# Patient Record
Sex: Female | Born: 1975 | Race: Black or African American | Hispanic: No | Smoking: Never smoker
Health system: Southern US, Community
[De-identification: ages and names within clinical notes are randomized; demographics above are authoritative.]

## PROBLEM LIST (undated history)

## (undated) ENCOUNTER — Inpatient Hospital Stay (HOSPITAL_COMMUNITY): Payer: Self-pay

## (undated) DIAGNOSIS — R87619 Unspecified abnormal cytological findings in specimens from cervix uteri: Secondary | ICD-10-CM

## (undated) DIAGNOSIS — I10 Essential (primary) hypertension: Secondary | ICD-10-CM

## (undated) DIAGNOSIS — I729 Aneurysm of unspecified site: Secondary | ICD-10-CM

## (undated) DIAGNOSIS — E611 Iron deficiency: Secondary | ICD-10-CM

## (undated) DIAGNOSIS — F319 Bipolar disorder, unspecified: Secondary | ICD-10-CM

## (undated) DIAGNOSIS — F4310 Post-traumatic stress disorder, unspecified: Secondary | ICD-10-CM

## (undated) DIAGNOSIS — IMO0002 Reserved for concepts with insufficient information to code with codable children: Secondary | ICD-10-CM

## (undated) DIAGNOSIS — J45909 Unspecified asthma, uncomplicated: Secondary | ICD-10-CM

## (undated) HISTORY — DX: Iron deficiency: E61.1

## (undated) HISTORY — DX: Post-traumatic stress disorder, unspecified: F43.10

## (undated) HISTORY — DX: Reserved for concepts with insufficient information to code with codable children: IMO0002

## (undated) HISTORY — DX: Unspecified abnormal cytological findings in specimens from cervix uteri: R87.619

## (undated) HISTORY — PX: NO PAST SURGERIES: SHX2092

## (undated) HISTORY — DX: Bipolar disorder, unspecified: F31.9

---

## 2001-03-17 ENCOUNTER — Encounter: Payer: Self-pay | Admitting: Emergency Medicine

## 2001-03-17 ENCOUNTER — Emergency Department (HOSPITAL_COMMUNITY): Admission: EM | Admit: 2001-03-17 | Discharge: 2001-03-17 | Payer: Self-pay | Admitting: Emergency Medicine

## 2001-08-17 ENCOUNTER — Other Ambulatory Visit: Admission: RE | Admit: 2001-08-17 | Discharge: 2001-08-17 | Payer: Self-pay | Admitting: Obstetrics and Gynecology

## 2002-07-14 ENCOUNTER — Emergency Department (HOSPITAL_COMMUNITY): Admission: EM | Admit: 2002-07-14 | Discharge: 2002-07-14 | Payer: Self-pay

## 2002-07-14 ENCOUNTER — Encounter: Payer: Self-pay | Admitting: *Deleted

## 2002-07-28 ENCOUNTER — Other Ambulatory Visit: Admission: RE | Admit: 2002-07-28 | Discharge: 2002-07-28 | Payer: Self-pay | Admitting: Obstetrics and Gynecology

## 2003-12-06 ENCOUNTER — Other Ambulatory Visit: Admission: RE | Admit: 2003-12-06 | Discharge: 2003-12-06 | Payer: Self-pay | Admitting: Obstetrics and Gynecology

## 2005-02-04 ENCOUNTER — Emergency Department (HOSPITAL_COMMUNITY): Admission: EM | Admit: 2005-02-04 | Discharge: 2005-02-04 | Payer: Self-pay | Admitting: Emergency Medicine

## 2005-04-13 ENCOUNTER — Other Ambulatory Visit: Admission: RE | Admit: 2005-04-13 | Discharge: 2005-04-13 | Payer: Self-pay | Admitting: Obstetrics and Gynecology

## 2005-08-22 ENCOUNTER — Inpatient Hospital Stay (HOSPITAL_COMMUNITY): Admission: AD | Admit: 2005-08-22 | Discharge: 2005-08-22 | Payer: Self-pay | Admitting: Obstetrics and Gynecology

## 2005-09-21 ENCOUNTER — Inpatient Hospital Stay (HOSPITAL_COMMUNITY): Admission: AD | Admit: 2005-09-21 | Discharge: 2005-09-21 | Payer: Self-pay | Admitting: Obstetrics and Gynecology

## 2005-10-06 ENCOUNTER — Inpatient Hospital Stay (HOSPITAL_COMMUNITY): Admission: AD | Admit: 2005-10-06 | Discharge: 2005-10-06 | Payer: Self-pay | Admitting: Obstetrics and Gynecology

## 2005-10-15 ENCOUNTER — Inpatient Hospital Stay (HOSPITAL_COMMUNITY): Admission: AD | Admit: 2005-10-15 | Discharge: 2005-10-17 | Payer: Self-pay | Admitting: Obstetrics and Gynecology

## 2006-05-12 ENCOUNTER — Emergency Department (HOSPITAL_COMMUNITY): Admission: EM | Admit: 2006-05-12 | Discharge: 2006-05-12 | Payer: Self-pay | Admitting: Emergency Medicine

## 2006-06-24 ENCOUNTER — Emergency Department (HOSPITAL_COMMUNITY): Admission: EM | Admit: 2006-06-24 | Discharge: 2006-06-24 | Payer: Self-pay | Admitting: Emergency Medicine

## 2007-08-09 ENCOUNTER — Inpatient Hospital Stay (HOSPITAL_COMMUNITY): Admission: AD | Admit: 2007-08-09 | Discharge: 2007-08-09 | Payer: Self-pay | Admitting: Obstetrics & Gynecology

## 2008-07-22 ENCOUNTER — Emergency Department (HOSPITAL_COMMUNITY): Admission: EM | Admit: 2008-07-22 | Discharge: 2008-07-22 | Payer: Self-pay | Admitting: Emergency Medicine

## 2008-07-28 ENCOUNTER — Emergency Department (HOSPITAL_COMMUNITY): Admission: EM | Admit: 2008-07-28 | Discharge: 2008-07-28 | Payer: Self-pay | Admitting: Family Medicine

## 2009-01-21 ENCOUNTER — Emergency Department (HOSPITAL_COMMUNITY): Admission: EM | Admit: 2009-01-21 | Discharge: 2009-01-21 | Payer: Self-pay | Admitting: Emergency Medicine

## 2009-04-10 ENCOUNTER — Emergency Department (HOSPITAL_COMMUNITY): Admission: EM | Admit: 2009-04-10 | Discharge: 2009-04-10 | Payer: Self-pay | Admitting: Emergency Medicine

## 2009-09-27 ENCOUNTER — Emergency Department (HOSPITAL_COMMUNITY): Admission: EM | Admit: 2009-09-27 | Discharge: 2009-09-27 | Payer: Self-pay | Admitting: Emergency Medicine

## 2010-05-02 LAB — POCT CARDIAC MARKERS
CKMB, poc: <1 ng/mL — ABNORMAL LOW (ref 1.0–8.0)
Myoglobin, poc: 34.9 ng/mL (ref 12–200)
Troponin i, poc: <0.05 ng/mL (ref 0.00–0.09)

## 2010-05-02 LAB — POCT I-STAT, CHEM 8
Calcium, Ion: 1.15 mmol/L (ref 1.12–1.32)
Creatinine, Ser: 0.9 mg/dL (ref 0.4–1.2)
Glucose, Bld: 83 mg/dL (ref 70–99)
Hemoglobin: 12.6 g/dL (ref 12.0–15.0)
Sodium: 140 mEq/L (ref 135–145)
TCO2: 27 mmol/L (ref 0–100)

## 2010-05-05 ENCOUNTER — Inpatient Hospital Stay (INDEPENDENT_AMBULATORY_CARE_PROVIDER_SITE_OTHER)
Admission: RE | Admit: 2010-05-05 | Discharge: 2010-05-05 | Disposition: A | Discharge status: Home / Self Care | Payer: Medicaid Other | Source: Ambulatory Visit | Attending: Family Medicine | Admitting: Family Medicine

## 2010-05-05 ENCOUNTER — Emergency Department (HOSPITAL_COMMUNITY): Payer: Medicaid Other

## 2010-05-05 ENCOUNTER — Emergency Department (HOSPITAL_COMMUNITY)
Admission: EM | Admit: 2010-05-05 | Discharge: 2010-05-05 | Disposition: A | Discharge status: Home / Self Care | Payer: Medicaid Other | Attending: Emergency Medicine | Admitting: Emergency Medicine

## 2010-05-05 DIAGNOSIS — H6690 Otitis media, unspecified, unspecified ear: Secondary | ICD-10-CM

## 2010-05-05 DIAGNOSIS — R51 Headache: Secondary | ICD-10-CM | POA: Insufficient documentation

## 2010-05-05 DIAGNOSIS — H539 Unspecified visual disturbance: Secondary | ICD-10-CM | POA: Insufficient documentation

## 2010-05-28 ENCOUNTER — Ambulatory Visit: Payer: Medicaid Other | Attending: Diagnostic Neuroimaging | Admitting: Physical Therapy

## 2010-05-28 DIAGNOSIS — IMO0001 Reserved for inherently not codable concepts without codable children: Secondary | ICD-10-CM | POA: Insufficient documentation

## 2010-05-28 DIAGNOSIS — R262 Difficulty in walking, not elsewhere classified: Secondary | ICD-10-CM | POA: Insufficient documentation

## 2010-06-10 ENCOUNTER — Ambulatory Visit: Payer: Medicaid Other | Admitting: Physical Therapy

## 2010-06-24 ENCOUNTER — Ambulatory Visit: Payer: Medicaid Other | Admitting: Physical Therapy

## 2010-07-08 ENCOUNTER — Ambulatory Visit: Payer: Medicaid Other | Admitting: Physical Therapy

## 2010-09-18 ENCOUNTER — Inpatient Hospital Stay (INDEPENDENT_AMBULATORY_CARE_PROVIDER_SITE_OTHER)
Admission: RE | Admit: 2010-09-18 | Discharge: 2010-09-18 | Disposition: A | Discharge status: Home / Self Care | Payer: Medicaid Other | Source: Ambulatory Visit | Attending: Family Medicine | Admitting: Family Medicine

## 2010-09-18 DIAGNOSIS — H6120 Impacted cerumen, unspecified ear: Secondary | ICD-10-CM

## 2010-10-28 ENCOUNTER — Inpatient Hospital Stay (INDEPENDENT_AMBULATORY_CARE_PROVIDER_SITE_OTHER)
Admission: RE | Admit: 2010-10-28 | Discharge: 2010-10-28 | Disposition: A | Discharge status: Home / Self Care | Payer: Medicaid Other | Source: Ambulatory Visit | Attending: Family Medicine | Admitting: Family Medicine

## 2010-10-28 DIAGNOSIS — J069 Acute upper respiratory infection, unspecified: Secondary | ICD-10-CM

## 2010-11-13 LAB — CBC
MCHC: 33.4
Platelets: 205
RDW: 14.7

## 2010-11-13 LAB — POCT PREGNANCY, URINE
Operator id: 22373
Preg Test, Ur: NEGATIVE

## 2010-11-13 LAB — HERPES SIMPLEX VIRUS CULTURE: Culture: NOT DETECTED

## 2010-11-13 LAB — GC/CHLAMYDIA PROBE AMP, GENITAL
Chlamydia, DNA Probe: NEGATIVE
GC Probe Amp, Genital: NEGATIVE

## 2011-01-02 ENCOUNTER — Emergency Department (HOSPITAL_COMMUNITY)
Admission: EM | Admit: 2011-01-02 | Discharge: 2011-01-02 | Disposition: A | Discharge status: Home / Self Care | Payer: Medicaid Other | Source: Home / Self Care | Attending: Family Medicine | Admitting: Family Medicine

## 2011-01-02 DIAGNOSIS — IMO0002 Reserved for concepts with insufficient information to code with codable children: Secondary | ICD-10-CM

## 2011-01-02 DIAGNOSIS — S43409A Unspecified sprain of unspecified shoulder joint, initial encounter: Secondary | ICD-10-CM

## 2011-01-02 MED ORDER — IBUPROFEN 800 MG PO TABS: 800.0000 mg | ORAL_TABLET | Freq: Three times a day (TID) | ORAL | Status: AC

## 2011-01-02 NOTE — ED Notes (Signed)
Pt states she slipped in her yard this am, fell backwards.  C/o pain in lt side of neck, lt shoulder and lt upper back.
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
## 2011-04-06 ENCOUNTER — Encounter (HOSPITAL_COMMUNITY): Payer: Self-pay

## 2011-04-06 ENCOUNTER — Emergency Department (INDEPENDENT_AMBULATORY_CARE_PROVIDER_SITE_OTHER)
Admission: EM | Admit: 2011-04-06 | Discharge: 2011-04-06 | Disposition: A | Discharge status: Home Health | Payer: Self-pay | Source: Home / Self Care | Attending: Family Medicine | Admitting: Family Medicine

## 2011-04-06 DIAGNOSIS — S161XXA Strain of muscle, fascia and tendon at neck level, initial encounter: Secondary | ICD-10-CM

## 2011-04-06 DIAGNOSIS — S139XXA Sprain of joints and ligaments of unspecified parts of neck, initial encounter: Secondary | ICD-10-CM

## 2011-04-06 MED ORDER — IBUPROFEN 600 MG PO TABS: 600.0000 mg | ORAL_TABLET | Freq: Three times a day (TID) | ORAL | Status: AC | PRN

## 2011-04-06 MED ORDER — CYCLOBENZAPRINE HCL 10 MG PO TABS: 10.0000 mg | ORAL_TABLET | Freq: Two times a day (BID) | ORAL | Status: AC | PRN
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
## 2011-04-06 NOTE — Discharge Instructions (Signed)
Take the prescribed medications as instructed. Be aware that Flexeril can make you drowsy and he should not drive after taking. Followup with the sports medicine or orthopedic if persistent pain after her 1 or 2 weeks or return earlier if worsening symptoms despite following treatment.  Cervical Strain and Sprain (Whiplash) with Rehab Cervical strain and sprains are injuries that commonly occur with "whiplash" injuries. Whiplash occurs when the neck is forcefully whipped backward or forward, such as during a motor vehicle accident. The muscles, ligaments, tendons, discs and nerves of the neck are susceptible to injury when this occurs. SYMPTOMS   Pain or stiffness in the front and/or back of neck   Symptoms may present immediately or up to 24 hours after injury.   Dizziness, headache, nausea and vomiting.   Muscle spasm with soreness and stiffness in the neck.   Tenderness and swelling at the injury site.  CAUSES  Whiplash injuries often occur during contact sports or motor vehicle accidents.  RISK INCREASES WITH:  Osteoarthritis of the spine.   Situations that make head or neck accidents or trauma more likely.   High-risk sports (football, rugby, wrestling, hockey, auto racing, gymnastics, diving, contact karate or boxing).   Poor strength and flexibility of the neck.   Previous neck injury.   Poor tackling technique.   Improperly fitted or padded equipment.  PREVENTION  Learn and use proper technique (avoid tackling with the head, spearing and head-butting; use proper falling techniques to avoid landing on the head).   Warm up and stretch properly before activity.   Maintain physical fitness:   Strength, flexibility and endurance.   Cardiovascular fitness.   Wear properly fitted and padded protective equipment, such as padded soft collars, for participation in contact sports.  PROGNOSIS  Recovery for cervical strain and sprain injuries is dependent on the extent  of the injury. These injuries are usually curable in 1 week to 3 months with appropriate treatment.  RELATED COMPLICATIONS   Temporary numbness and weakness may occur if the nerve roots are damaged, and this may persist until the nerve has completely healed.   Chronic pain due to frequent recurrence of symptoms.   Prolonged healing, especially if activity is resumed too soon (before complete recovery).  TREATMENT  Treatment initially involves the use of ice and medication to help reduce pain and inflammation. It is also important to perform strengthening and stretching exercises and modify activities that worsen symptoms so the injury does not get worse. These exercises may be performed at home or with a therapist. For patients who experience severe symptoms, a soft padded collar may be recommended to be worn around the neck.  Improving your posture may help reduce symptoms. Posture improvement includes pulling your chin and abdomen in while sitting or standing. If you are sitting, sit in a firm chair with your buttocks against the back of the chair. While sleeping, try replacing your pillow with a small towel rolled to 2 inches in diameter, or use a cervical pillow or soft cervical collar. Poor sleeping positions delay healing.  For patients with nerve root damage, which causes numbness or weakness, the use of a cervical traction apparatus may be recommended. Surgery is rarely necessary for these injuries. However, cervical strain and sprains that are present at birth (congenital) may require surgery. MEDICATION   If pain medication is necessary, nonsteroidal anti-inflammatory medications, such as aspirin and ibuprofen, or other minor pain relievers, such as acetaminophen, are often recommended.   Do not take  pain medication for 7 days before surgery.   Prescription pain relievers may be given if deemed necessary by your caregiver. Use only as directed and only as much as you need.  HEAT AND  COLD:   Cold treatment (icing) relieves pain and reduces inflammation. Cold treatment should be applied for 10 to 15 minutes every 2 to 3 hours for inflammation and pain and immediately after any activity that aggravates your symptoms. Use ice packs or an ice massage.   Heat treatment may be used prior to performing the stretching and strengthening activities prescribed by your caregiver, physical therapist, or athletic trainer. Use a heat pack or a warm soak.  SEEK MEDICAL CARE IF:   Symptoms get worse or do not improve in 2 weeks despite treatment.   New, unexplained symptoms develop (drugs used in treatment may produce side effects).  EXERCISES RANGE OF MOTION (ROM) AND STRETCHING EXERCISES - Cervical Strain and Sprain These exercises may help you when beginning to rehabilitate your injury. In order to successfully resolve your symptoms, you must improve your posture. These exercises are designed to help reduce the forward-head and rounded-shoulder posture which contributes to this condition. Your symptoms may resolve with or without further involvement from your physician, physical therapist or athletic trainer. While completing these exercises, remember:   Restoring tissue flexibility helps normal motion to return to the joints. This allows healthier, less painful movement and activity.   An effective stretch should be held for at least 20 seconds, although you may need to begin with shorter hold times for comfort.   A stretch should never be painful. You should only feel a gentle lengthening or release in the stretched tissue.  STRETCH- Axial Extensors  Lie on your back on the floor. You may bend your knees for comfort. Place a rolled up hand towel or dish towel, about 2 inches in diameter, under the part of your head that makes contact with the floor.   Gently tuck your chin, as if trying to make a "double chin," until you feel a gentle stretch at the base of your head.   Hold  __________ seconds.  Repeat __________ times. Complete this exercise __________ times per day.  STRETECH - Axial Extension   Stand or sit on a firm surface. Assume a good posture: chest up, shoulders drawn back, abdominal muscles slightly tense, knees unlocked (if standing) and feet hip width apart.   Slowly retract your chin so your head slides back and your chin slightly lowers.Continue to look straight ahead.   You should feel a gentle stretch in the back of your head. Be certain not to feel an aggressive stretch since this can cause headaches later.   Hold for __________ seconds.  Repeat __________ times. Complete this exercise __________ times per day. STRETCH - Cervical Side Bend   Stand or sit on a firm surface. Assume a good posture: chest up, shoulders drawn back, abdominal muscles slightly tense, knees unlocked (if standing) and feet hip width apart.   Without letting your nose or shoulders move, slowly tip your right / left ear to your shoulder until your feel a gentle stretch in the muscles on the opposite side of your neck.   Hold __________ seconds.  Repeat __________ times. Complete this exercise __________ times per day. STRETCH - Cervical Rotators   Stand or sit on a firm surface. Assume a good posture: chest up, shoulders drawn back, abdominal muscles slightly tense, knees unlocked (if standing) and feet hip width  apart.   Keeping your eyes level with the ground, slowly turn your head until you feel a gentle stretch along the back and opposite side of your neck.   Hold __________ seconds.  Repeat __________ times. Complete this exercise __________ times per day. RANGE OF MOTION - Neck Circles   Stand or sit on a firm surface. Assume a good posture: chest up, shoulders drawn back, abdominal muscles slightly tense, knees unlocked (if standing) and feet hip width apart.   Gently roll your head down and around from the back of one shoulder to the back of the other. The  motion should never be forced or painful.   Repeat the motion 10-20 times, or until you feel the neck muscles relax and loosen.  Repeat __________ times. Complete the exercise __________ times per day. STRENGTHENING EXERCISES - Cervical Strain and Sprain These exercises may help you when beginning to rehabilitate your injury. They may resolve your symptoms with or without further involvement from your physician, physical therapist or athletic trainer. While completing these exercises, remember:   Muscles can gain both the endurance and the strength needed for everyday activities through controlled exercises.   Complete these exercises as instructed by your physician, physical therapist or athletic trainer. Progress the resistance and repetitions only as guided.   You may experience muscle soreness or fatigue, but the pain or discomfort you are trying to eliminate should never worsen during these exercises. If this pain does worsen, stop and make certain you are following the directions exactly. If the pain is still present after adjustments, discontinue the exercise until you can discuss the trouble with your clinician.  STRENGTH - Cervical Flexors, Isometric  Face a wall, standing about 6 inches away. Place a small pillow, a ball about 6-8 inches in diameter, or a folded towel between your forehead and the wall.   Slightly tuck your chin and gently push your forehead into the soft object. Push only with mild to moderate intensity, building up tension gradually. Keep your jaw and forehead relaxed.   Hold 10 to 20 seconds. Keep your breathing relaxed.   Release the tension slowly. Relax your neck muscles completely before you start the next repetition.  Repeat __________ times. Complete this exercise __________ times per day. STRENGTH- Cervical Lateral Flexors, Isometric   Stand about 6 inches away from a wall. Place a small pillow, a ball about 6-8 inches in diameter, or a folded towel  between the side of your head and the wall.   Slightly tuck your chin and gently tilt your head into the soft object. Push only with mild to moderate intensity, building up tension gradually. Keep your jaw and forehead relaxed.   Hold 10 to 20 seconds. Keep your breathing relaxed.   Release the tension slowly. Relax your neck muscles completely before you start the next repetition.  Repeat __________ times. Complete this exercise __________ times per day. STRENGTH - Cervical Extensors, Isometric   Stand about 6 inches away from a wall. Place a small pillow, a ball about 6-8 inches in diameter, or a folded towel between the back of your head and the wall.   Slightly tuck your chin and gently tilt your head back into the soft object. Push only with mild to moderate intensity, building up tension gradually. Keep your jaw and forehead relaxed.   Hold 10 to 20 seconds. Keep your breathing relaxed.   Release the tension slowly. Relax your neck muscles completely before you start the next repetition.  Repeat __________ times. Complete this exercise __________ times per day. POSTURE AND BODY MECHANICS CONSIDERATIONS - Cervical Strain and Sprain Keeping correct posture when sitting, standing or completing your activities will reduce the stress put on different body tissues, allowing injured tissues a chance to heal and limiting painful experiences. The following are general guidelines for improved posture. Your physician or physical therapist will provide you with any instructions specific to your needs. While reading these guidelines, remember:  The exercises prescribed by your provider will help you have the flexibility and strength to maintain correct postures.   The correct posture provides the optimal environment for your joints to work. All of your joints have less wear and tear when properly supported by a spine with good posture. This means you will experience a healthier, less painful body.     Correct posture must be practiced with all of your activities, especially prolonged sitting and standing. Correct posture is as important when doing repetitive low-stress activities (typing) as it is when doing a single heavy-load activity (lifting).  PROLONGED STANDING WHILE SLIGHTLY LEANING FORWARD When completing a task that requires you to lean forward while standing in one place for a long time, place either foot up on a stationary 2-4 inch high object to help maintain the best posture. When both feet are on the ground, the low back tends to lose its slight inward curve. If this curve flattens (or becomes too large), then the back and your other joints will experience too much stress, fatigue more quickly and can cause pain.  RESTING POSITIONS Consider which positions are most painful for you when choosing a resting position. If you have pain with flexion-based activities (sitting, bending, stooping, squatting), choose a position that allows you to rest in a less flexed posture. You would want to avoid curling into a fetal position on your side. If your pain worsens with extension-based activities (prolonged standing, working overhead), avoid resting in an extended position such as sleeping on your stomach. Most people will find more comfort when they rest with their spine in a more neutral position, neither too rounded nor too arched. Lying on a non-sagging bed on your side with a pillow between your knees, or on your back with a pillow under your knees will often provide some relief. Keep in mind, being in any one position for a prolonged period of time, no matter how correct your posture, can still lead to stiffness. WALKING Walk with an upright posture. Your ears, shoulders and hips should all line-up. OFFICE WORK When working at a desk, create an environment that supports good, upright posture. Without extra support, muscles fatigue and lead to excessive strain on joints and other  tissues. CHAIR:  A chair should be able to slide under your desk when your back makes contact with the back of the chair. This allows you to work closely.   The chair's height should allow your eyes to be level with the upper part of your monitor and your hands to be slightly lower than your elbows.   Body position:   Your feet should make contact with the floor. If this is not possible, use a foot rest.   Keep your ears over your shoulders. This will reduce stress on your neck and low back.  Document Released: 02/02/2005 Document Revised: 10/15/2010 Document Reviewed: 05/17/2008 [facility] Patient Information 2012 [place], [place].
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
## 2011-06-09 ENCOUNTER — Emergency Department (HOSPITAL_COMMUNITY)
Admission: EM | Admit: 2011-06-09 | Discharge: 2011-06-09 | Disposition: A | Discharge status: Home / Self Care | Payer: Medicaid Other | Source: Home / Self Care | Attending: Emergency Medicine | Admitting: Emergency Medicine

## 2011-06-09 ENCOUNTER — Encounter (HOSPITAL_COMMUNITY): Payer: Self-pay | Admitting: Emergency Medicine

## 2011-06-09 DIAGNOSIS — J209 Acute bronchitis, unspecified: Secondary | ICD-10-CM

## 2011-06-09 MED ORDER — ALBUTEROL SULFATE HFA 108 (90 BASE) MCG/ACT IN AERS: 1.0000 | INHALATION_SPRAY | Freq: Four times a day (QID) | RESPIRATORY_TRACT | Status: DC | PRN

## 2011-06-09 MED ORDER — BENZONATATE 200 MG PO CAPS: 200.0000 mg | ORAL_CAPSULE | Freq: Three times a day (TID) | ORAL | Status: AC | PRN

## 2011-06-09 MED ORDER — PREDNISONE 5 MG PO KIT: 1.0000 | PACK | Freq: Every day | ORAL | Status: DC

## 2011-06-09 MED ORDER — AZITHROMYCIN 250 MG PO TABS: ORAL_TABLET | ORAL | Status: AC

## 2011-06-09 NOTE — ED Notes (Signed)
Patient reports spotting earlier this month, but no period

## 2011-06-09 NOTE — Discharge Instructions (Signed)
Most upper respiratory infections are caused by viruses and do not require antibiotics.  We try to save the antibiotics for when we really need them to avoid resistance.  This does not mean that there is nothing that can be done.  Here are a few hints about things that can be done at home to get over an upper respiratory infection quicker:  Get extra sleep and extra fluids.  Get 7 to 9 hours of sleep per night and 6 to 8 glasses of water a day.  Getting extra sleep keeps the immune system from getting run down.  Most people with an upper respiratory infection are a little dehydrated.  The extra fluids also keep the secretions liquified and easier to deal with.  Also, get extra vitamin C.  4000 mg per day is the recommended dose. For the aches, headache, and fever, acetaminophen or ibuprofen are helpful.  These can be alternated every 4 hours.  People with liver disease should avoid large amounts of acetaminophen, and people with ulcer disease, gastroesophageal reflux, gastritis, congestive heart failure, chronic kidney disease, coronary artery disease and the elderly should avoid ibuprofen. For nasal congestion try Mucinex-D, or if you're having lots of sneezing or copious clear nasal drainage Allegra-D-24 hour.  A Saline nasal spray such as Ocean Spray can also help as can decongestant sprays such as Afrin, but you should not use the decongestant sprays for more than 3 or 4 days since they can be habituating.  If nasal dryness is a problem, Ayr Nasal Gel can help moisturize your nasal passages.  Breath Rite nasal strips can also offer a non-drug alternative treatment to nasal congestion, especially at night. For people with symptoms of sinusitis, sleeping with your head elevated can be helpful.  For sinus pain, moist, hot compresses to the face may provide some relief.  Many people find that inhaling steam as in a shower or from a pot of steaming water can help. For sore throat, zinc containing lozenges such  as Cold-Eze or Zicam are helpful.  Zinc helps to fight infection and has a mild astringent effect that relieves the sore, achey throat.  Hot salt water gargles (8 oz of hot water, 1/2 tsp of table salt, and a pinch of baking soda) can give relief as well as hot beverages such as hot tea. For the cough, old time remedies such as honey or honey and lemon are tried and true.  Over the counter cough syrups such as Delsym 2 tsp every 12 hours can help as well. It has also been found that Aleve is helpful in treating cough.  You can take 1 to 2 tablets twice daily with food.  This can be taken along with Delsym.  It's important when you have an upper respiratory infection not to pass the infection to others.  This involves being very careful about the following:  Frequent hand washing or use of hand sanitizer, especially after coughing, sneezing, blowing your nose or touching your face, nose or eyes. Do not shake hands or touch anyone and try to avoid touching surfaces that other people use such as doorknobs, shopping carts, telephones and computer keyboards. Use tissues and dispose of them properly in a garbage can or ziplock bag. Cough into your sleeve. Do not let others eat or drink after you.  It's also important to recognize the signs of serious illness and get evaluated if they occur: Any respiratory infection that lasts more than 7 to 10 days.  Yellow  nasal drainage and sputum are not reliable indicators of a bacterial infection, but if they last for more than 1 week, see your doctor. Fever and sore throat can indicate strep. Fever and cough can indicate influenza or pneumonia. Any kind of severe symptom such as difficulty breathing, intractable vomiting, or severe pain should prompt you to see a doctor as soon as possible.   Your body's immune system is really the thing that will get rid of this infection.  Your immune system is comprised of 2 types of specialized cells called T cells and B  cells.  T cells coordinate the array of cells in your body that engulf invading bacteria or viruses while B cells orchestrate the production of antibodies that neutralize infection.  Anything we do or any medications we give you, will just strengthen your immune system or help it clear up the infection quicker.  Here are a few helpful hints to improve your immune system to help overcome this illness or to prevent future infections:  A few vitamins can improve the health of your immune system.  That's why your diet should include plenty of fruits, vegetables, fish, nuts, and whole grains.  Vitamin A and bet-carotene can increase the cells that fight infections (T cells and B cells).  Vitamin A is abundant in dark greens and orange vegetables such as spinach, greens, sweet potatoes, and carrots.  Vitamin B6 contributes to the maturation of white blood cells, the cells that fight disease.  Foods with vitamin B6 include cold cereal and bananas.  Vitamin C is credited with preventing colds because it increases white blood cells and also prevents cellular damage.  Citrus fruits, peaches and green and red bell peppers are all hight in vitamin C.  Vitamin E is an anti-oxidant that encourages the production of natural killer cells which reject foreign invaders and B cells that produce antibodies.  Foods high in vitamin E include wheat germ, nuts and seeds.  Foods high in omega-3 fatty acids found in foods like salmon, tuna and mackerel boost your immune system and help cells to engulf and absorb germs.  Probiotics are good bacteria that increase your T cells.  These can be found in yogurt and are available in supplements such as Culturelle or Align.  Moderate exercise increases the strength of your immune system and your ability to recover from illness.  I suggest 3 to 5 moderate intensity 30 minute workouts per week.    Sleep is another component of maintaining a strong immune system.  It enables your body  to recuperate from the day's activities, stress and work.  My recommendation is to get between 7 and 9 hours of sleep per night.  If you smoke, try to quit completely or at least cut down.  Drink alcohol only in moderation if at all.  No more than 2 drinks daily for men or 1 for women.  Get a flu vaccine early in the fall or if you have not gotten one yet, once this illness has run its course.  If you are over 65, a smoker, or an asthmatic, get a pneumococcal vaccine.  My final recommendation is to maintain a healthy weight.  Excess weight can impair the immune system by interfering with the way the immune system deals with invading viruses or bacteria.

## 2011-06-09 NOTE — ED Notes (Signed)
Sent patient to obtain specimen

## 2011-06-09 NOTE — ED Notes (Signed)
Patient has a cough, chills, sore throat, hot/cold episodes.  Intermittent runny nose.  Denies fever.  Reports one episode of vomiting, on Sunday.  Denies diarrhea

## 2011-06-09 NOTE — ED Provider Notes (Signed)
Chief Complaint  Patient presents with  . URI    History of Present Illness:   The patient is a 36 year old female who's had a one-week history of nonproductive cough, chest tightness, sore throat, has felt hot and cold and chilled, had posttussive vomiting, aching in her sides, and rhinorrhea. She denies nasal congestion, headache, or diarrhea. She has a history of migraine headaches. No history of asthma, and she is not a smoker.  Review of Systems:  Other than noted above, the patient denies any of the following symptoms. Systemic:  No fever, chills, sweats, fatigue, myalgias, headache, or anorexia. Eye:  No redness,pain or drainage. ENT:  No earache, ear congestion, nasal congestion, sneezing, rhinorrhea, sinus pressure, sinus pain, post nasal drip, or sore throat. Lungs:  No cough, sputum production, wheezing, shortness of breath. Or chest pain. Skin:  No rash or itching.  PMFSH:  Past medical history, family history, social history, meds, and allergies were reviewed.  Physical Exam:   Vital signs:  BP 122/73  Pulse 88  Temp(Src) 98.3 F (36.8 C) (Oral)  Resp 16  SpO2 100%  LMP 05/12/2011 General:  Alert, in no distress. Eye:  No conjunctival injection or drainage. Lids were normal. ENT:  TMs and canals were normal, without erythema or inflammation.  Nasal mucosa was congested, without drainage.  Mucous membranes were moist.  Pharynx was erythematous without exudate or drainage.  There were no oral ulcerations or lesions. Neck:  Supple, no adenopathy, tenderness or mass. Lungs:  No respiratory distress.  Lungs were clear to auscultation, without wheezes, rales or rhonchi.  Breath sounds were clear and equal bilaterally. Heart:  Regular rhythm, without gallops, murmers or rubs. Skin:  Clear, warm, and dry, without rash or lesions.  Assessment:  The encounter diagnosis was Acute bronchitis.  Plan:   1.  The following meds were prescribed:   New Prescriptions   ALBUTEROL  (PROVENTIL HFA;VENTOLIN HFA) 108 (90 BASE) MCG/ACT INHALER    Inhale 1-2 puffs into the lungs every 6 (six) hours as needed for wheezing.   AZITHROMYCIN (ZITHROMAX Z-PAK) 250 MG TABLET    Take as directed.   BENZONATATE (TESSALON) 200 MG CAPSULE    Take 1 capsule (200 mg total) by mouth 3 (three) times daily as needed for cough.   PREDNISONE 5 MG KIT    Take 1 kit (5 mg total) by mouth daily after breakfast. Prednisone 5 mg 6 day dosepack.  Take as directed.   2.  The patient was instructed in symptomatic care and handouts were given. 3.  The patient was told to return if becoming worse in any way, if no better in 3 or 4 days, and given some red flag symptoms that would indicate earlier return.   Reuben Likes, MD 06/09/11 1147

## 2011-07-15 ENCOUNTER — Emergency Department (HOSPITAL_COMMUNITY)
Admission: EM | Admit: 2011-07-15 | Discharge: 2011-07-15 | Disposition: A | Discharge status: Home / Self Care | Payer: Medicaid Other | Source: Home / Self Care | Attending: Emergency Medicine | Admitting: Emergency Medicine

## 2011-07-15 ENCOUNTER — Encounter (HOSPITAL_COMMUNITY): Payer: Self-pay

## 2011-07-15 ENCOUNTER — Encounter (HOSPITAL_COMMUNITY): Payer: Self-pay | Admitting: Emergency Medicine

## 2011-07-15 ENCOUNTER — Inpatient Hospital Stay (HOSPITAL_COMMUNITY)
Admission: AD | Admit: 2011-07-15 | Discharge: 2011-07-15 | Disposition: A | Discharge status: Home / Self Care | Payer: Medicaid Other | Source: Ambulatory Visit | Attending: Obstetrics & Gynecology | Admitting: Obstetrics & Gynecology

## 2011-07-15 ENCOUNTER — Inpatient Hospital Stay (HOSPITAL_COMMUNITY): Payer: Medicaid Other

## 2011-07-15 DIAGNOSIS — O209 Hemorrhage in early pregnancy, unspecified: Secondary | ICD-10-CM | POA: Insufficient documentation

## 2011-07-15 DIAGNOSIS — Z331 Pregnant state, incidental: Secondary | ICD-10-CM

## 2011-07-15 DIAGNOSIS — O4690 Antepartum hemorrhage, unspecified, unspecified trimester: Secondary | ICD-10-CM

## 2011-07-15 DIAGNOSIS — O3410 Maternal care for benign tumor of corpus uteri, unspecified trimester: Secondary | ICD-10-CM | POA: Insufficient documentation

## 2011-07-15 DIAGNOSIS — D259 Leiomyoma of uterus, unspecified: Secondary | ICD-10-CM | POA: Insufficient documentation

## 2011-07-15 LAB — DIFFERENTIAL
Basophils Absolute: 0.1 10*3/uL (ref 0.0–0.1)
Basophils Relative: 1 % (ref 0–1)
Lymphocytes Relative: 31 % (ref 12–46)
Monocytes Absolute: 0.8 10*3/uL (ref 0.1–1.0)
Neutro Abs: 5.1 10*3/uL (ref 1.7–7.7)
Neutrophils Relative %: 57 % (ref 43–77)

## 2011-07-15 LAB — POCT URINALYSIS DIP (DEVICE)
Glucose, UA: 250 mg/dL — AB
Nitrite: POSITIVE — AB
Specific Gravity, Urine: 1.015 (ref 1.005–1.030)
Urobilinogen, UA: >=8 mg/dL (ref 0.0–1.0)

## 2011-07-15 LAB — WET PREP, GENITAL: Clue Cells Wet Prep HPF POC: NONE SEEN

## 2011-07-15 LAB — CBC
HCT: 34.9 % — ABNORMAL LOW (ref 36.0–46.0)
Hemoglobin: 11.9 g/dL — ABNORMAL LOW (ref 12.0–15.0)
RDW: 14.4 % (ref 11.5–15.5)
WBC: 9 10*3/uL (ref 4.0–10.5)

## 2011-07-15 LAB — ABO/RH: ABO/RH(D): A POS

## 2011-07-15 NOTE — Discharge Instructions (Signed)
Return here in 2 days to repeat your pregnancy hormone level. Return sooner for any problems.  Fibroids Fibroids are lumps (tumors) that can occur any place in a woman's body. These lumps are not cancerous. Fibroids vary in size, weight, and where they grow. HOME CARE  Do not take aspirin.   Write down the number of pads or tampons you use during your period. Tell your doctor. This can help determine the best treatment for you.  GET HELP RIGHT AWAY IF:  You have pain in your lower belly (abdomen) that is not helped with medicine.   You have cramps that are not helped with medicine.   You have more bleeding between or during your period.   You feel lightheaded or pass out (faint).   Your lower belly pain gets worse.  MAKE SURE YOU:  Understand these instructions.   Will watch your condition.   Will get help right away if you are not doing well or get worse.  Document Released: 03/07/2010 Document Revised: 01/22/2011 Document Reviewed: 03/07/2010 [facility] Patient Information 2012 [place], [place].Fibroids Fibroids are lumps (tumors) that can occur any place in a woman's body. These lumps are not cancerous. Fibroids vary in size, weight, and where they grow. HOME CARE  Do not take aspirin.   Write down the number of pads or tampons you use during your period. Tell your doctor. This can help determine the best treatment for you.  GET HELP RIGHT AWAY IF:  You have pain in your lower belly (abdomen) that is not helped with medicine.   You have cramps that are not helped with medicine.   You have more bleeding between or during your period.   You feel lightheaded or pass out (faint).   Your lower belly pain gets worse.  MAKE SURE YOU:  Understand these instructions.   Will watch your condition.   Will get help right away if you are not doing well or get worse.  Document Released: 03/07/2010 Document Revised: 01/22/2011 Document Reviewed: 03/07/2010 [facility] Patient  Information 2012 [place], [place].

## 2011-07-15 NOTE — Progress Notes (Signed)
Pt waiting to go to ultrasound.

## 2011-07-15 NOTE — ED Notes (Signed)
Pt having dysmenorrhea for 12 days. She states she has super high cramping and lots of blood with painful cramping.

## 2011-07-15 NOTE — MAU Note (Signed)
Patient states she has been bleeding since 5-17. Usually has 3-4 days periods. Has been getting heavier and passing clots. Went to Urgent Care and had a positive pregnancy test.

## 2011-07-15 NOTE — MAU Note (Signed)
Pt states she started her period on may 7th and she has not stopped bleeding and changing pads frequently. Pt had a positive pregnancy test today.

## 2011-07-15 NOTE — MAU Provider Note (Signed)
History     CSN: [id number]  Arrival date & time 07/15/11  4098   None     Chief Complaint  Patient presents with  . Vaginal Bleeding  . Abdominal Pain   HPI Vicki Mack is a 36 y.o. female who presents to MAU for abnormal vaginal bleeding. She went to Urgent Care this morning and had a positive pregnancy test and was told to come to MAU for further evaluation. Patient not using any birth control. Current sex partner x 1 year. Last pap smear 2007 and was normal.  Last sexual intercourse first week in May. LMP 05/01/11 and then bleeding started again 5/17. No history of STI's. The history was provided by the patient.  Past Medical History  Diagnosis Date  . Migraine   . Migraine     No past surgical history on file.  No family history on file.  History  Substance Use Topics  . Smoking status: Never Smoker   . Smokeless tobacco: Not on file  . Alcohol Use: No    OB History    Grav Para Term Preterm Abortions TAB SAB Ect Mult Living   2 1 1              Review of Systems  Constitutional: Negative for fever, chills, diaphoresis and fatigue.  HENT: Negative for ear pain, congestion, sore throat, facial swelling, neck pain, neck stiffness, dental problem and sinus pressure.   Eyes: Negative for photophobia, pain and discharge.  Respiratory: Positive for cough. Negative for chest tightness and wheezing.   Cardiovascular: Negative for chest pain and palpitations.  Gastrointestinal: Positive for nausea, abdominal pain and constipation. Negative for vomiting, diarrhea and abdominal distention.  Genitourinary: Positive for frequency, vaginal bleeding and pelvic pain. Negative for dysuria, urgency, flank pain, vaginal discharge and difficulty urinating.  Musculoskeletal: Negative for myalgias, back pain and gait problem.  Skin: Negative for color change and rash.  Neurological: Positive for headaches. Negative for dizziness, speech difficulty, weakness, light-headedness  and numbness.  Psychiatric/Behavioral: Negative for confusion and agitation. The patient is not nervous/anxious.     Allergies  Codeine; Penicillins; and Sulfa antibiotics  Home Medications  No current outpatient prescriptions on file.  BP 136/90  Pulse 80  Temp(Src) 97.9 F (36.6 C) (Oral)  Resp 16  Ht 5' 5.5" (1.664 m)  Wt 122 lb 3.2 oz (55.43 kg)  BMI 20.03 kg/m2  SpO2 100%  LMP 07/03/2011  Physical Exam  Nursing note and vitals reviewed. Constitutional: She is oriented to person, place, and time. She appears well-developed and well-nourished. No distress.  HENT:  Head: Normocephalic.  Eyes: EOM are normal.  Neck: Neck supple.  Cardiovascular: Normal rate.   Pulmonary/Chest: Effort normal.  Abdominal: Soft. There is no tenderness.  Genitourinary:       External genitalia without lesions. Large blood with clots vaginal vault. Cervix finger tip dilated. Positive CMT, mild bilateral adnexal tenderness. Uterus slightly enlarged.  Musculoskeletal: Normal range of motion.  Neurological: She is alert and oriented to person, place, and time. No cranial nerve deficit.  Skin: Skin is warm and dry.  Psychiatric: She has a normal mood and affect. Her behavior is normal. Judgment and thought content normal.   Results for orders placed during the hospital encounter of 07/15/11 (from the past 24 hour(s))  HCG, QUANTITATIVE, PREGNANCY     Status: Abnormal   Collection Time   07/15/11 10:05 AM      Component Value Range   hCG, Beta Chain,  Quant, S 3149 (*) <5 (mIU/mL)  ABO/RH     Status: Normal (Preliminary result)   Collection Time   07/15/11 10:05 AM      Component Value Range   ABO/RH(D) A POS    WET PREP, GENITAL     Status: Abnormal   Collection Time   07/15/11 10:55 AM      Component Value Range   Yeast Wet Prep HPF POC NONE SEEN  NONE SEEN    Trich, Wet Prep NONE SEEN  NONE SEEN    Clue Cells Wet Prep HPF POC NONE SEEN  NONE SEEN    WBC, Wet Prep HPF POC FEW (*) NONE  SEEN     ED Course  Procedures   Assessment: Bleeding in pregnancy   Uterine fibroid  Diff. Dx SAB   Ectopic   IUP  Plan:  Ultrasound   GC, Chlamydia cultues     Ultrasound shows no IUP or adnexal mass, 2.3 cm fibroid Will have patient return in 48 hours to repeat the Bhcg She will return immediately for any problems.  MDM

## 2011-07-15 NOTE — ED Provider Notes (Signed)
History     CSN: [id number]  Arrival date & time 07/15/11  0801   First MD Initiated Contact with Patient 07/15/11 [id number]      Chief Complaint  Patient presents with  . Dysmenorrhea    (Consider location/radiation/quality/duration/timing/severity/associated sxs/prior treatment) HPI Comments: G2 P1 Patient with 12 days of vaginal spotting, cramping. States she had a gush of blood last night with significant increase in the amount of vaginal bleeding and passing clots. Is reporting crampy, nonradiating, lower midline pain that is now lasting 10-15 minutes , These  cramps are several hours apart. Reports nausea, no vomiting. No presyncope or syncope. No history of ectopic pregnancies. she had light spotting instead of a regular period last month . She is also reporting some urinary urgency, frequency. No dysuria, flank pain, back pain.    Patient is a 36 y.o. female presenting with vaginal bleeding. The history is provided by the patient. No language interpreter was used.  Vaginal Bleeding This is a new problem. The current episode started more than 1 week ago. The problem occurs constantly. The problem has been gradually worsening. Associated symptoms include abdominal pain. The symptoms are aggravated by nothing. The symptoms are relieved by nothing. Treatments tried: nsaid. The treatment provided no relief.    Past Medical History  Diagnosis Date  . Migraine   . Migraine     History reviewed. No pertinent past surgical history.  History reviewed. No pertinent family history.  History  Substance Use Topics  . Smoking status: Never Smoker   . Smokeless tobacco: Not on file  . Alcohol Use: No    OB History    Grav Para Term Preterm Abortions TAB SAB Ect Mult Living   2 1 1              Review of Systems  Constitutional: Negative for fever.  Gastrointestinal: Positive for nausea and abdominal pain. Negative for vomiting.  Genitourinary: Positive for urgency, frequency,  vaginal bleeding and pelvic pain. Negative for dysuria and hematuria.  Neurological: Negative for weakness.    Allergies  Codeine; Penicillins; and Sulfa antibiotics  Home Medications   Current Outpatient Rx  Name Route Sig Dispense Refill  . IBUPROFEN 200 MG PO CAPS Oral Take by mouth.    . ALBUTEROL SULFATE HFA 108 (90 BASE) MCG/ACT IN AERS Inhalation Inhale 1-2 puffs into the lungs every 6 (six) hours as needed for wheezing. 1 Inhaler 0  . GABAPENTIN (PHN) PO Oral Take by mouth 3 (three) times daily.      Marland Kitchen PREDNISONE 5 MG PO KIT Oral Take 1 kit (5 mg total) by mouth daily after breakfast. Prednisone 5 mg 6 day dosepack.  Take as directed. 1 kit 0  . ADVIL COLD/SINUS PO Oral Take by mouth.      BP 126/76  Pulse 80  Temp(Src) 97.8 F (36.6 C) (Oral)  Resp 16  SpO2 100%  LMP 07/14/2011  Physical Exam  Nursing note and vitals reviewed. Constitutional: She is oriented to person, place, and time. She appears well-developed and well-nourished. No distress.  HENT:  Head: Normocephalic and atraumatic.  Eyes: EOM are normal.  Neck: Normal range of motion.  Cardiovascular: Normal rate.   Pulmonary/Chest: Effort normal.  Abdominal: Soft. Bowel sounds are normal. She exhibits no distension. There is tenderness in the suprapubic area. There is no rebound and no guarding.  Genitourinary: Pelvic exam was performed with patient supine. There is no rash on the right labia. There is no rash on  the left labia. Uterus is tender. Uterus is not enlarged. Cervix exhibits no motion tenderness and no friability. Right adnexum displays no mass, no tenderness and no fullness. Left adnexum displays no mass, no tenderness and no fullness. No erythema or tenderness around the vagina. No foreign body around the vagina.       Active bleeding from os. No tissue visualized.Chaperone present during exam  Musculoskeletal: Normal range of motion.  Neurological: She is alert and oriented to person, place, and  time.  Skin: Skin is warm and dry.  Psychiatric: She has a normal mood and affect. Her behavior is normal. Judgment and thought content normal.    ED Course  Procedures (including critical care time)  Labs Reviewed  POCT PREGNANCY, URINE - Abnormal; Notable for the following:    Preg Test, Ur POSITIVE (*)    All other components within normal limits  CBC  DIFFERENTIAL  WET PREP, GENITAL  GC/CHLAMYDIA PROBE AMP, GENITAL   No results found.   1. Vaginal bleeding in pregnancy, first trimester   2. Pregnancy, incidental    Results for orders placed during the hospital encounter of 07/15/11  CBC      Component Value Range   WBC 9.0  4.0 - 10.5 (K/uL)   RBC 4.14  3.87 - 5.11 (MIL/uL)   Hemoglobin 11.9 (*) 12.0 - 15.0 (g/dL)   HCT 21.3 (*) 08.6 - 46.0 (%)   MCV 84.3  78.0 - 100.0 (fL)   MCH 28.7  26.0 - 34.0 (pg)   MCHC 34.1  30.0 - 36.0 (g/dL)   RDW 57.8  46.9 - 62.9 (%)   Platelets 230  150 - 400 (K/uL)  DIFFERENTIAL      Component Value Range   Neutrophils Relative 57  43 - 77 (%)   Neutro Abs 5.1  1.7 - 7.7 (K/uL)   Lymphocytes Relative 31  12 - 46 (%)   Lymphs Abs 2.8  0.7 - 4.0 (K/uL)   Monocytes Relative 8  3 - 12 (%)   Monocytes Absolute 0.8  0.1 - 1.0 (K/uL)   Eosinophils Relative 3  0 - 5 (%)   Eosinophils Absolute 0.3  0.0 - 0.7 (K/uL)   Basophils Relative 1  0 - 1 (%)   Basophils Absolute 0.1  0.0 - 0.1 (K/uL)  POCT PREGNANCY, URINE      Component Value Range   Preg Test, Ur POSITIVE (*) NEGATIVE      MDM   U. pregnant positive She does not know how long she's been pregnant. Tender uterus, no adnexal tenderness, and some bleeding from os. No tissue visualized. Transferring to women's to rule out ectopic versus threatened AB versus inevitable AB. Discussed case with kelly at [facility].. Discussed results and plan with patient. She states that she'll be driving there now.   Luiz Blare, MD 07/15/11 [phone]

## 2011-07-16 LAB — GC/CHLAMYDIA PROBE AMP, GENITAL: GC Probe Amp, Genital: NEGATIVE

## 2011-07-17 ENCOUNTER — Inpatient Hospital Stay (HOSPITAL_COMMUNITY)
Admission: AD | Admit: 2011-07-17 | Discharge: 2011-07-17 | Disposition: A | Discharge status: Home / Self Care | Payer: Medicaid Other | Source: Ambulatory Visit | Attending: Obstetrics & Gynecology | Admitting: Obstetrics & Gynecology

## 2011-07-17 ENCOUNTER — Encounter (HOSPITAL_COMMUNITY): Payer: Self-pay

## 2011-07-17 DIAGNOSIS — O0289 Other abnormal products of conception: Secondary | ICD-10-CM

## 2011-07-17 DIAGNOSIS — O021 Missed abortion: Secondary | ICD-10-CM | POA: Insufficient documentation

## 2011-07-17 DIAGNOSIS — O020 Blighted ovum and nonhydatidiform mole: Secondary | ICD-10-CM

## 2011-07-17 LAB — HCG, QUANTITATIVE, PREGNANCY: hCG, Beta Chain, Quant, S: 1081 m[IU]/mL — ABNORMAL HIGH (ref <5)

## 2011-07-17 NOTE — MAU Note (Signed)
Patient to MAU for repeat BHCG. States continues to have some bleeding a lower abdominal pain last night. Only spotting this am and no pain at this time.

## 2011-07-17 NOTE — MAU Provider Note (Signed)
  History     CSN: [id number]  Arrival date and time: 07/17/11 8657   None     Chief Complaint  Patient presents with  . Follow-up   HPI  Pt presents for f/u Beta HCG.  Pt was seen on 5/29 with bleeding on 5/17.  Her LMP was 3/15.  Her Quant was 3149 and ultrasound showed no IUP and 2.3 cm fibroid.  Pt is having some spotting and cramping today.  Past Medical History  Diagnosis Date  . Migraine   . Migraine     No past surgical history on file.  No family history on file.  History  Substance Use Topics  . Smoking status: Never Smoker   . Smokeless tobacco: Not on file  . Alcohol Use: No    Allergies:  Allergies  Allergen Reactions  . Codeine Rash  . Penicillins Rash  . Sulfa Antibiotics Rash    Prescriptions prior to admission  Medication Sig Dispense Refill  . acetaminophen (TYLENOL) 500 MG tablet Take 500 mg by mouth every 6 (six) hours as needed. For pain      . gabapentin (NEURONTIN) 300 MG capsule Take 300 mg by mouth 3 (three) times daily.      Marland Kitchen albuterol (PROVENTIL HFA;VENTOLIN HFA) 108 (90 BASE) MCG/ACT inhaler Inhale 1-2 puffs into the lungs every 6 (six) hours as needed for wheezing.  1 Inhaler  0    ROS Physical Exam   Blood pressure 131/89, pulse 91, temperature 98.1 F (36.7 C), temperature source Oral, resp. rate 16, last menstrual period 07/03/2011, SpO2 100.00%.  Physical Exam  Vitals reviewed. Constitutional: She is oriented to person, place, and time. She appears well-developed and well-nourished.  HENT:  Head: Normocephalic.  Eyes: Pupils are equal, round, and reactive to light.  Neck: Normal range of motion. Neck supple.  Cardiovascular: Normal rate.   Respiratory: Effort normal.  GI: Soft.  Musculoskeletal: Normal range of motion.  Neurological: She is alert and oriented to person, place, and time.  Skin: Skin is warm and dry.  Psychiatric: She has a normal mood and affect.    MAU Course  Procedures Results for orders  placed during the hospital encounter of 07/17/11 (from the past 24 hour(s))  HCG, QUANTITATIVE, PREGNANCY     Status: Abnormal   Collection Time   07/17/11  8:25 AM      Component Value Range   hCG, Beta Chain, Quant, S 1081 (*) <5 (mIU/mL)   From 5/29 ultrasound w/quant 3149   Assessment and Plan  Failed IUP F/u 1 week for repeat HCG  Denyla Cortese 07/17/2011, 10:24 AM

## 2011-07-17 NOTE — Discharge Instructions (Signed)
Blighted Ovum °A blighted ovum (anembryonic pregnancy) happens when a fertilized egg (embryo) attaches itself to the uterine wall, but the embryo does not develop. The pregnancy sac (placenta) continues to grow even though the embryo does not grow and develop. The pregnancy hormone is still secreted because the placenta has formed. This will result in a positive pregnancy test despite having an abnormal pregnancy. A blighted ovum occurs within the first trimester, sometimes before a woman knows she is pregnant.   °CAUSES °A blighted ovum is usually the result of chromosomal problems. This can be caused by abnormal cell division or poor quality sperm or egg. °SYMPTOMS °Early on, signs of pregnancy may be experienced, such as: °· A missed menstrual period.  °· Fatigue.  °· Feeling sick to your stomach (nauseous).  °· Sore breasts.  °· A positive pregnancy test.   °Then, signs of miscarriage may develop, such as: °· Abdominal cramps.  °· Vaginal bleeding or spotting.  °· A menstrual period that is heavier than usual.  °DIAGNOSIS °The diagnosis of a blighted ovum is made with an ultrasound test that shows an empty uterus or an empty gestational sac. °TREATMENT °Your caregiver will help you decide what the best treatment is for you. Treatment for a blighted ovum includes:  °· Letting your body naturally pass the tissue of a blighted ovum.  °· Taking medicine to trigger the miscarriage.  °· Having a procedure called a dilation and curettage (D&C) to remove the placental tissues.    °A D&C may be helpful if you would like the tissue examined to determine the reason for a miscarriage. Talk to your caregiver about the risks involved with this procedure. °HOME CARE INSTRUCTIONS  °· Follow up with your caregiver to make sure that your pregnancy hormone returns to zero.  °· Wait at least 1 to 3 regular menstrual cycles before trying to get pregnant again, or as recommended by your caregiver.  °SEEK IMMEDIATE MEDICAL CARE  IF: °· You have worsening abdominal pain.  °· You have very heavy bleeding or use 1 to 2 pads every hour, for more than 2 hours.  °· You are dizzy, feel faint, or pass out.  °Document Released: 05/20/2010 Document Revised: 01/22/2011 Document Reviewed: 05/20/2010 °ExitCare® Patient Information ©2012 [employer]. °

## 2011-07-17 NOTE — Progress Notes (Signed)
I was referred to pt by nurse.  Abbegale was tearful as she begins to cope with the loss of her pregnancy that she very much wanted and had just recently learned about.   She is 36 and so in addition to feeling grief for the loss of this baby, she is afraid that she will not have another chance to have another baby.  She has a five year-old daughter and she has some family support.  We spoke about ways to memorialize and remember this baby and it helped her to think through some ideas to honor her child.  We also spoke about what she can expect from her grief process.  I offered emotional support as well as grief education.  [employer] Pager, [phone] 1:10 PM   07/17/11 1300  Clinical Encounter Type  Visited With Patient  Visit Type Initial;Spiritual support;Death (Miscarriage)  Referral From Nurse  Spiritual Encounters  Spiritual Needs Emotional

## 2011-07-24 ENCOUNTER — Inpatient Hospital Stay (HOSPITAL_COMMUNITY)
Admission: AD | Admit: 2011-07-24 | Discharge: 2011-07-24 | Disposition: A | Discharge status: Home / Self Care | Payer: Medicaid Other | Source: Ambulatory Visit | Attending: Obstetrics & Gynecology | Admitting: Obstetrics & Gynecology

## 2011-07-24 DIAGNOSIS — O039 Complete or unspecified spontaneous abortion without complication: Secondary | ICD-10-CM | POA: Insufficient documentation

## 2011-07-24 LAB — HCG, QUANTITATIVE, PREGNANCY: hCG, Beta Chain, Quant, S: 89 m[IU]/mL — ABNORMAL HIGH (ref <5)

## 2011-07-24 NOTE — MAU Provider Note (Signed)
  History     CSN: [id number]  Arrival date and time: 07/24/11 0952   None     No chief complaint on file.   HPI Vicki Mack is 36 y.o. G2P1000 [redacted]w[redacted]d weeks presents for followup BHCG for failed IUP.  BHCG on 5/27 3149 and on 5/31 1081.  Ultrasound did not see IUP.  Blood type A+.  She denies bleeding or pain today.    Past Medical History  Diagnosis Date  . Migraine   . Migraine     No past surgical history on file.  No family history on file.  History  Substance Use Topics  . Smoking status: Never Smoker   . Smokeless tobacco: Not on file  . Alcohol Use: No    Allergies:  Allergies  Allergen Reactions  . Codeine Rash  . Penicillins Rash  . Sulfa Antibiotics Rash    Prescriptions prior to admission  Medication Sig Dispense Refill  . acetaminophen (TYLENOL) 500 MG tablet Take 500 mg by mouth every 6 (six) hours as needed. For pain      . albuterol (PROVENTIL HFA;VENTOLIN HFA) 108 (90 BASE) MCG/ACT inhaler Inhale 1-2 puffs into the lungs every 6 (six) hours as needed for wheezing.  1 Inhaler  0  . gabapentin (NEURONTIN) 300 MG capsule Take 300 mg by mouth 3 (three) times daily.        Review of Systems  Constitutional: Negative for fever and chills.  Gastrointestinal: Negative for abdominal pain.  Genitourinary:       Negative for vaginal bleeding   Physical Exam   Last menstrual period 07/03/2011.  Physical Exam  Constitutional: She is oriented to person, place, and time. She appears well-developed and well-nourished. No distress.  HENT:  Head: Normocephalic.  Respiratory: Effort normal.  Genitourinary:       Not indicated  Neurological: She is alert and oriented to person, place, and time.  Skin: Skin is warm and dry.  Psychiatric: She has a normal mood and affect. Her behavior is normal. Thought content normal.    MAU Course  Procedures  MDM Reviewed labs with the patient.  F/U in GYN clinic in 1 week  Assessment and Plan  A:   Miscarriage      Rapidly falling BHCG  P:  Appt requested from GYN for follow-up BHCG in 1-2 weeks      Patient would like to establish care with the clinic.  She will talk with them at the time of the BHCG.  Concettina Leth,EVE M 07/24/2011, 10:00 AM

## 2011-07-24 NOTE — MAU Note (Signed)
Patient to MAU for repeat BHCG. Denies any pain or bleeding.  

## 2011-07-24 NOTE — Discharge Instructions (Signed)
Miscarriage (Spontaneous Miscarriage)  A miscarriage is when you lose your baby before the twentieth week of pregnancy. Miscarriages happen in 15-20% of pregnancies. Most miscarriages happen in the first 13 weeks of the pregnancy. In medical terms, this is called a spontaneous miscarriage or early pregnancy loss. No further treatment is needed when the miscarriage is complete and all products of conception have been passed out of the body. You can begin trying for another pregnancy as soon as your caregiver says it is okay.  CAUSES    Most causes are not known.   Genetic problems like abnormal, not enough or too many chromosomes.   Infection of the cervix or uterus.   An abnormal shaped uterus, fibroid tumors or congenital abnormalities.   Hormone problems.   Medical problems.   Incompetent cervix, the tissue in the cervix is not strong enough to hold the pregnancy.   Smoking, too much alcohol use and illegal drugs.   Trauma.  SYMPTOMS    Bleeding or spotting from the vagina.   Cramping of the lower abdomen.   Passing of fluid from the vagina with or without cramps or pain.   Passing fetal tissue.  TREATMENT    Sometimes no further treatment is necessary if you pass all the tissue in the uterus.   If partial parts of the fetus or placenta remain in the body (incomplete miscarriage), tissue left behind may become infected. Usually a D and C (Dilatation and Curettage) suction or scrapping of the uterus is necessary to remove the remaining tissue in uterus. The procedure is only done when your caregiver knows that there is no chance for the pregnancy to continue. This is determined by a physical exam, a negative pregnancy test, blood tests and perhaps an ultrasound revealing a dead fetus or no fetus developing because a problem occurred at conception (when the sperm and egg unite).   Medications may be necessary, antibiotics if there is an infection or medications to contract the uterus if there is a  lot of bleeding.   If you have Rh negative blood and your partner is Rh positive, you will need a Rho-gam shot (an immune globulin vaccine). This will protect your baby from having Rh blood problems in future pregnancies.  HOME CARE INSTRUCTIONS    Your caregiver may order bed rest (up to the bathroom only). He or she may allow you to continue light activity. You may need to make arrangements for the care of children and for any other responsibilities.   Keep track of the number of pads you use each day and how soaked (saturated) they are. Record this information.   Do not use tampons. Do not douche or have sexual intercourse until approved by your caregiver.   Only take over-the-counter or prescription medicines for pain, discomfort or fever as directed by your caregiver.   Do not take aspirin because it can cause bleeding.   It is very important to keep all follow-up appointments for re-evaluations and continuing management.   Tell your caregiver if you are experiencing domestic violence.   Women who have an Rh negative blood type (i.e., A, B, AB, or O negative) need to receive a drug called Rh(D) immune globulin (RhoGam). This medicine helps protect future fetuses against problems that can occur if an Rh negative mother is carrying a baby who is Rh positive.   If you and/or your partner are having problems with guilt or grieving, talk to your caregiver or seek counseling to help   you cope with the pregnancy loss. Allow enough time to grieve before trying to get pregnant again.  SEEK IMMEDIATE MEDICAL CARE IF:    You have severe cramps or pain in your stomach, back, or belly (abdomen).   You have a fever.   You pass large clots or tissue. Save any tissue for your caregiver to inspect.   Your bleeding increases.   You become light-headed, weak or have fainting episodes.   You develop chills.  Document Released: 07/29/2000 Document Revised: 01/22/2011 Document Reviewed: 09/05/2007  ExitCare Patient  Information 2012 [employer].

## 2011-08-04 ENCOUNTER — Other Ambulatory Visit: Payer: Medicaid Other

## 2011-08-04 DIAGNOSIS — O039 Complete or unspecified spontaneous abortion without complication: Secondary | ICD-10-CM

## 2011-08-10 ENCOUNTER — Telehealth: Payer: Self-pay | Admitting: *Deleted

## 2011-08-10 NOTE — Telephone Encounter (Signed)
Patient called again today at 1020 and left another message requesting test results.

## 2011-08-10 NOTE — Telephone Encounter (Signed)
Patient called and states she had a blood draw 6/18 and did not receive a call back with results and wants a call back.

## 2011-08-12 NOTE — Telephone Encounter (Signed)
Called pt and left message to return the call to the clinics.  

## 2011-08-13 ENCOUNTER — Other Ambulatory Visit: Payer: Self-pay | Admitting: Obstetrics and Gynecology

## 2011-08-13 DIAGNOSIS — O039 Complete or unspecified spontaneous abortion without complication: Secondary | ICD-10-CM

## 2011-08-13 NOTE — Telephone Encounter (Signed)
Patient callled and left a message stating she missed Vicki Mack's call.

## 2011-08-13 NOTE — Telephone Encounter (Signed)
Called patient back and no answer. Left message to return our call. Also, in regard of her result-- per Dr. Jolayne Panther patient needs to come back for HCg repeat lab one week from last draw (08/04/11). Hence, patient needs to make Lab appointment asap for Hcg quant.

## 2011-08-13 NOTE — Telephone Encounter (Signed)
Patient called and agreed to have hcg quant labs done again. Lab appt made for 08/14/11 @ 0915. Patient satisfied

## 2011-08-14 ENCOUNTER — Other Ambulatory Visit: Payer: Medicaid Other

## 2011-08-14 DIAGNOSIS — O039 Complete or unspecified spontaneous abortion without complication: Secondary | ICD-10-CM

## 2011-08-25 ENCOUNTER — Telehealth: Payer: Self-pay | Admitting: *Deleted

## 2011-08-25 NOTE — Telephone Encounter (Signed)
Pt stated that she just received a call and her phone cut off. She would like a call back.  I called pt and left a message that her call last night was to remind her of clinic appt this week on 7/11 @ 1545.  Please call back if she needs to re-schedule.

## 2011-08-27 ENCOUNTER — Other Ambulatory Visit (HOSPITAL_COMMUNITY)
Admission: RE | Admit: 2011-08-27 | Discharge: 2011-08-27 | Disposition: A | Discharge status: Home / Self Care | Payer: Medicaid Other | Source: Ambulatory Visit | Attending: Advanced Practice Midwife | Admitting: Advanced Practice Midwife

## 2011-08-27 ENCOUNTER — Ambulatory Visit (INDEPENDENT_AMBULATORY_CARE_PROVIDER_SITE_OTHER): Payer: Medicaid Other | Admitting: Advanced Practice Midwife

## 2011-08-27 ENCOUNTER — Encounter: Payer: Self-pay | Admitting: Advanced Practice Midwife

## 2011-08-27 VITALS — BP 127/79 | HR 73 | Temp 97.4°F | Ht 66.5 in | Wt 124.2 lb

## 2011-08-27 DIAGNOSIS — Z3009 Encounter for other general counseling and advice on contraception: Secondary | ICD-10-CM

## 2011-08-27 DIAGNOSIS — Z01419 Encounter for gynecological examination (general) (routine) without abnormal findings: Secondary | ICD-10-CM | POA: Insufficient documentation

## 2011-08-27 DIAGNOSIS — N898 Other specified noninflammatory disorders of vagina: Secondary | ICD-10-CM

## 2011-08-27 DIAGNOSIS — Z1151 Encounter for screening for human papillomavirus (HPV): Secondary | ICD-10-CM | POA: Insufficient documentation

## 2011-08-27 DIAGNOSIS — G43009 Migraine without aura, not intractable, without status migrainosus: Secondary | ICD-10-CM

## 2011-08-27 DIAGNOSIS — O039 Complete or unspecified spontaneous abortion without complication: Secondary | ICD-10-CM

## 2011-08-27 LAB — POCT PREGNANCY, URINE: Preg Test, Ur: NEGATIVE

## 2011-08-27 MED ORDER — FLUCONAZOLE 150 MG PO TABS
150.0000 mg | ORAL_TABLET | Freq: Once | ORAL | Status: AC
Start: 2011-08-27 — End: 2011-08-28

## 2011-08-27 MED ORDER — NORGESTIMATE-ETH ESTRADIOL 0.25-35 MG-MCG PO TABS: 1.0000 | ORAL_TABLET | Freq: Every day | ORAL | Status: DC

## 2011-08-27 NOTE — Progress Notes (Signed)
Subjective:     Patient ID: Vicki Mack, female   DOB: [birth year], 36 y.o.   MRN: [id number]  HPI G2P1000 presents to Gyn clinic following a failed IUP for well woman exam and Pap.  She desires pregnancy in the next year but wants to discuss contraception for the next month or two after her SAB.  She has not yet a menstrual period after the bleeding resolved following her miscarriage.  She denies family or personal history of blood clots, is a nonsmoker, and reports her migraines do not have aura and are well managed with Neurontin.  She is having some itchy vaginal discharge today, but denies other problems.      Past Medical History  Diagnosis Date  . Migraine   . Migraine   . Abnormal Pap smear     colpo with biopsy at age 89  . Infertility     able to become pregnant with Clomid   Review of Systems  Constitutional: Negative.   Respiratory: Negative for cough, shortness of breath and wheezing.   Cardiovascular: Negative for leg swelling.  Gastrointestinal: Negative for abdominal distention.  Genitourinary: Positive for vaginal discharge. Negative for dysuria, frequency, vaginal bleeding, vaginal pain, menstrual problem and pelvic pain.  Musculoskeletal: Negative.   Neurological: Positive for headaches. Negative for dizziness.  Psychiatric/Behavioral: Negative.   All other systems reviewed and are negative.       Objective:   Physical Exam  Nursing note and vitals reviewed. Constitutional: She appears well-developed and well-nourished.  Cardiovascular: Normal rate, regular rhythm, normal heart sounds and intact distal pulses.   Pulmonary/Chest: Effort normal and breath sounds normal.  Abdominal: Soft. Bowel sounds are normal.  Genitourinary: Uterus normal. Vaginal discharge (white, thick ) found.       Pelvic exam: Cervix pink, visually closed, without lesion, vaginal walls and external genitalia normal Bimanual exam: Cervix 0/long/high, firm, posterior, neg CMT,  uterus anteflexed, mildly tender, nonenlarged, adnexa without tenderness, enlargement, or mass   Results for orders placed in visit on 08/27/11 (from the past 24 hour(s))  POCT PREGNANCY, URINE     Status: Normal   Collection Time   08/27/11  3:44 PM      Component Value Range   Preg Test, Ur NEGATIVE  NEGATIVE   BP 127/79  Pulse 73  Temp 97.4 F (36.3 C)  Ht 5' 6.5" (1.689 m)  Wt 56.337 kg (124 lb 3.2 oz)  BMI 19.75 kg/m2  LMP 07/03/2011    Assessment:     Vaginal candidiasis Well woman exam and Pap Desired fertility in next year    Plan:     1.  Pap collected at today's visit 2.  Wet prep collected--Diflucan 150 mg x1 dose 3.  Discussed recommendation to wait for 2-3 menstrual cycles before trying to become pregnant. Discussed options for contraception and pt prefers short term OCPs.  Sprintec prescribed. 4.  F/U as needed, recommend annual well woman exam

## 2011-08-28 LAB — WET PREP, GENITAL

## 2012-01-04 ENCOUNTER — Ambulatory Visit (INDEPENDENT_AMBULATORY_CARE_PROVIDER_SITE_OTHER): Payer: Medicaid Other

## 2012-01-04 DIAGNOSIS — Z3200 Encounter for pregnancy test, result unknown: Secondary | ICD-10-CM

## 2012-03-31 ENCOUNTER — Ambulatory Visit: Payer: Medicaid Other

## 2012-09-26 ENCOUNTER — Encounter: Payer: Self-pay | Admitting: Diagnostic Neuroimaging

## 2012-09-26 ENCOUNTER — Ambulatory Visit (INDEPENDENT_AMBULATORY_CARE_PROVIDER_SITE_OTHER): Payer: Medicaid Other | Admitting: Diagnostic Neuroimaging

## 2012-09-26 VITALS — BP 133/87 | HR 69 | Ht 67.0 in | Wt 113.0 lb

## 2012-09-26 DIAGNOSIS — G43009 Migraine without aura, not intractable, without status migrainosus: Secondary | ICD-10-CM

## 2012-09-26 MED ORDER — SUMATRIPTAN SUCCINATE 100 MG PO TABS: 100.0000 mg | ORAL_TABLET | Freq: Once | ORAL | Status: DC | PRN

## 2012-09-26 MED ORDER — AMITRIPTYLINE HCL 25 MG PO TABS: 25.0000 mg | ORAL_TABLET | Freq: Every day | ORAL | Status: DC

## 2012-09-26 NOTE — Progress Notes (Signed)
[facility]  PATIENT: Vicki Mack DOB: 03-08-1975  REFERRING CLINICIAN:  HISTORY FROM: patient REASON FOR VISIT: follow up   HISTORICAL  CHIEF COMPLAINT:  Chief Complaint  Patient presents with  . Follow-up    headache    HISTORY OF PRESENT ILLNESS:   UPDATE 09/26/12: Since last visit patient had one no-show appointment. Then was lost to followup. Patient continues that 6 severe headaches per month, mainly right-sided throbbing with redness in the right eye. Headaches can last a few hours at a time. Patient is off of all medications except over-the-counter Tylenol. In the past we have tried Topamax and gabapentin without relief.  We also tried Cymbalta without relief.  UPDATE 07/31/10: Still with headaches.  On higher gabapentin, but no relief.  Went to ER and given tramadol, but got nausea.  Not yet back to Dr. Dione Booze.  Now with depression because she feels taht her headaches are keeping her from geting a job.  UPDATE 07/16/10: Still with headaches, numbness and blurred vision.  Saw Dr. Dione Booze who suspected high risk for glaucoma (increased cup-disc ratio, right worse than left).  She will see him back in Aug 2012.  Gabapentin is helping her HA.  PRIOR HPI: 37 year old right-handed female with history of palpitations and panic attacks 10 years ago, here for evaluation of headaches, vision changes and numbness x 3 weeks.  Patient reports intermittent episodes of bilateral occipital electrical sensation radiating to her bitemporal regions. This lasts for 15 minutes at a time. During these episodes her vision in both eyes blacks out.  She did not lose consciousness or fall down. Her vision loss last for 5 minutes at a time. These episodes are occurring 1-2 times every 2 days. She has no nausea or vomiting with these episodes.  In addition over the past 3 weeks she is fallen down 3 times. She feels as though her legs give out underneath her without warning.  She  tends to feel more weakness on her right arm and right leg.  In addition her left toes feel numb.  REVIEW OF SYSTEMS: Full 14 system review of systems performed and notable only for headache fevers chest pain or vision loss of vision eye pain constipation feeling hot and cold memory loss headaches numbness no speech dizziness at depression decreased energy not to sleep change in appetite.  ALLERGIES: Allergies  Allergen Reactions  . Codeine Rash  . Penicillins Rash  . Sulfa Antibiotics Rash    HOME MEDICATIONS:  Outpatient Prescriptions Prior to Visit  Medication Sig Dispense Refill  . acetaminophen (TYLENOL) 500 MG tablet Take 500 mg by mouth every 6 (six) hours as needed. For pain      . gabapentin (NEURONTIN) 300 MG capsule Take 300 mg by mouth 3 (three) times daily.      Marland Kitchen albuterol (PROVENTIL HFA;VENTOLIN HFA) 108 (90 BASE) MCG/ACT inhaler Inhale 1-2 puffs into the lungs every 6 (six) hours as needed for wheezing.  1 Inhaler  0  . norgestimate-ethinyl estradiol (ORTHO-CYCLEN,SPRINTEC,PREVIFEM) 0.25-35 MG-MCG tablet Take 1 tablet by mouth daily.  1 Package  11   No facility-administered medications prior to visit.    PAST MEDICAL HISTORY: Past Medical History  Diagnosis Date  . Migraine   . Migraine   . Abnormal Pap smear     colpo with biopsy at age 40  . Infertility     able to become pregnant with Clomid    PAST SURGICAL HISTORY: History reviewed. No pertinent past surgical  history.  FAMILY HISTORY: Family History  Problem Relation Age of Onset  . Diabetes Mother   . Hypertension Mother   . Diabetes Father   . Hypertension Father   . Other Sister     breast cysts  . Breast cancer Paternal Aunt     had mastectomy    SOCIAL HISTORY:  History   Social History  . Marital Status: Legally Separated    Spouse Name: N/A    Number of Children: 1  . Years of Education: College   Occupational History  .      N/a   Social History Main Topics  . Smoking  status: Never Smoker   . Smokeless tobacco: Never Used  . Alcohol Use: No  . Drug Use: No  . Sexually Active: Yes    Birth Control/ Protection: None   Other Topics Concern  . Not on file   Social History Narrative   Patient lives at home with family.   Caffeine Use: 1 soda weekly     PHYSICAL EXAM  Filed Vitals:   09/26/12 1441  BP: 133/87  Pulse: 69  Height: 5\' 7"  (1.702 m)  Weight: 113 lb (51.256 kg)    Not recorded    Body mass index is 17.69 kg/(m^2).  GENERAL EXAM: Patient is in no distress  CARDIOVASCULAR: Regular rate and rhythm, no murmurs, no carotid bruits  NEUROLOGIC: MENTAL STATUS: awake, alert, language fluent, comprehension intact, naming intact CRANIAL NERVE: no papilledema on fundoscopic exam, pupils equal and reactive to light, visual fields full to confrontation, extraocular muscles intact, no nystagmus, facial sensation and strength symmetric, uvula midline, shoulder shrug symmetric, tongue midline. MOTOR: normal bulk and tone, full strength in the BUE, BLE SENSORY: normal and symmetric to light touch COORDINATION: finger-nose-finger, fine finger movements normal REFLEXES: deep tendon reflexes present and symmetric GAIT/STATION: narrow based gait; able to walk on toes, heels and tandem; romberg is negative   DIAGNOSTIC DATA (LABS, IMAGING, TESTING) - I reviewed patient records, labs, notes, testing and imaging myself where available.  Lab Results  Component Value Date   WBC 9.0 07/15/2011   HGB 11.9* 07/15/2011   HCT 34.9* 07/15/2011   MCV 84.3 07/15/2011   PLT 230 07/15/2011      Component Value Date/Time   NA 140 09/27/2009 1924   K 3.8 09/27/2009 1924   CL 105 09/27/2009 1924   GLUCOSE 83 09/27/2009 1924   BUN 8 09/27/2009 1924   CREATININE 0.9 09/27/2009 1924   No results found for this basename: CHOL, HDL, LDLCALC, LDLDIRECT, TRIG, CHOLHDL   No results found for this basename: HGBA1C   No results found for this basename: VITAMINB12    No results found for this basename: TSH    08/15/11 MRI BRAIN - normal   ASSESSMENT AND PLAN  37 y.o. year old female here with chronic headache, some migraine features. Patient has tried and failed gabapentin, Topamax and Cymbalta in the past.  Dx: migraine without aura  PLAN: 1. Amitriptyline + sumatriptan prn  Meds ordered this encounter  Medications  . amitriptyline (ELAVIL) 25 MG tablet    Sig: Take 1 tablet (25 mg total) by mouth at bedtime.    Dispense:  30 tablet    Refill:  6  . SUMAtriptan (IMITREX) 100 MG tablet    Sig: Take 1 tablet (100 mg total) by mouth once as needed for migraine. May repeat x 1 after 2 hours; maximum 2 tabs per day and 8 tabs per  month    Dispense:  8 tablet    Refill:  6    Return in about 3 months (around 12/27/2012) for with Heide Guile or Atoya Andrew.    Suanne Marker, MD 09/26/2012, 3:27 PM Certified in Neurology, Neurophysiology and Neuroimaging  [facility] [address], [address] [place], [place] [address] [phone]

## 2012-09-26 NOTE — Patient Instructions (Signed)
Migraine Headache A migraine headache is an intense, throbbing pain on one or both sides of your head. A migraine can last for 30 minutes to several hours. CAUSES  The exact cause of a migraine headache is not always known. However, a migraine may be caused when nerves in the brain become irritated and release chemicals that cause inflammation. This causes pain. SYMPTOMS  Pain on one or both sides of your head.  Pulsating or throbbing pain.  Severe pain that prevents daily activities.  Pain that is aggravated by any physical activity.  Nausea, vomiting, or both.  Dizziness.  Pain with exposure to bright lights, loud noises, or activity.  General sensitivity to bright lights, loud noises, or smells. Before you get a migraine, you may get warning signs that a migraine is coming (aura). An aura may include:  Seeing flashing lights.  Seeing bright spots, halos, or zig-zag lines.  Having tunnel vision or blurred vision.  Having feelings of numbness or tingling.  Having trouble talking.  Having muscle weakness. MIGRAINE TRIGGERS  Alcohol.  Smoking.  Stress.  Menstruation.  Aged cheeses.  Foods or drinks that contain nitrates, glutamate, aspartame, or tyramine.  Lack of sleep.  Chocolate.  Caffeine.  Hunger.  Physical exertion.  Fatigue.  Medicines used to treat chest pain (nitroglycerine), birth control pills, estrogen, and some blood pressure medicines. DIAGNOSIS  A migraine headache is often diagnosed based on:  Symptoms.  Physical examination.  A CT scan or MRI of your head. TREATMENT Medicines may be given for pain and nausea. Medicines can also be given to help prevent recurrent migraines.  HOME CARE INSTRUCTIONS  Only take over-the-counter or prescription medicines for pain or discomfort as directed by your caregiver. The use of long-term narcotics is not recommended.  Lie down in a dark, quiet room when you have a migraine.  Keep a journal  to find out what may trigger your migraine headaches. For example, write down:  What you eat and drink.  How much sleep you get.  Any change to your diet or medicines.  Limit alcohol consumption.  Quit smoking if you smoke.  Get 7 to 9 hours of sleep, or as recommended by your caregiver.  Limit stress.  Keep lights dim if bright lights bother you and make your migraines worse. SEEK IMMEDIATE MEDICAL CARE IF:   Your migraine becomes severe.  You have a fever.  You have a stiff neck.  You have vision loss.  You have muscular weakness or loss of muscle control.  You start losing your balance or have trouble walking.  You feel faint or pass out.  You have severe symptoms that are different from your first symptoms. MAKE SURE YOU:   Understand these instructions.  Will watch your condition.  Will get help right away if you are not doing well or get worse. Document Released: 02/02/2005 Document Revised: 04/27/2011 Document Reviewed: 01/23/2011 [employer] Patient Information 2014 [employer].  

## 2012-11-30 ENCOUNTER — Ambulatory Visit: Payer: Medicaid Other | Admitting: Diagnostic Neuroimaging

## 2013-02-01 ENCOUNTER — Encounter: Payer: Self-pay | Admitting: Diagnostic Neuroimaging

## 2013-03-27 ENCOUNTER — Emergency Department (HOSPITAL_COMMUNITY)
Admission: EM | Admit: 2013-03-27 | Discharge: 2013-03-27 | Disposition: A | Discharge status: Home / Self Care | Payer: Medicaid Other | Attending: Emergency Medicine | Admitting: Emergency Medicine

## 2013-03-27 ENCOUNTER — Emergency Department (HOSPITAL_COMMUNITY): Payer: Medicaid Other

## 2013-03-27 DIAGNOSIS — Z8742 Personal history of other diseases of the female genital tract: Secondary | ICD-10-CM | POA: Insufficient documentation

## 2013-03-27 DIAGNOSIS — F411 Generalized anxiety disorder: Secondary | ICD-10-CM | POA: Insufficient documentation

## 2013-03-27 DIAGNOSIS — I10 Essential (primary) hypertension: Secondary | ICD-10-CM | POA: Insufficient documentation

## 2013-03-27 DIAGNOSIS — Z3202 Encounter for pregnancy test, result negative: Secondary | ICD-10-CM | POA: Insufficient documentation

## 2013-03-27 DIAGNOSIS — R079 Chest pain, unspecified: Secondary | ICD-10-CM | POA: Insufficient documentation

## 2013-03-27 DIAGNOSIS — R110 Nausea: Secondary | ICD-10-CM | POA: Insufficient documentation

## 2013-03-27 DIAGNOSIS — G43909 Migraine, unspecified, not intractable, without status migrainosus: Secondary | ICD-10-CM | POA: Insufficient documentation

## 2013-03-27 DIAGNOSIS — Z880 Allergy status to penicillin: Secondary | ICD-10-CM | POA: Insufficient documentation

## 2013-03-27 DIAGNOSIS — Z791 Long term (current) use of non-steroidal anti-inflammatories (NSAID): Secondary | ICD-10-CM | POA: Insufficient documentation

## 2013-03-27 LAB — COMPREHENSIVE METABOLIC PANEL
ALT: 14 U/L (ref 0–35)
AST: 31 U/L (ref 0–37)
Albumin: 3 g/dL — ABNORMAL LOW (ref 3.5–5.2)
Alkaline Phosphatase: 44 U/L (ref 39–117)
BUN: 7 mg/dL (ref 6–23)
CO2: 23 meq/L (ref 19–32)
CREATININE: 0.98 mg/dL (ref 0.50–1.10)
Calcium: 8.2 mg/dL — ABNORMAL LOW (ref 8.4–10.5)
Chloride: 103 mEq/L (ref 96–112)
GFR calc Af Amer: 83 mL/min — ABNORMAL LOW (ref >90)
GFR, EST NON AFRICAN AMERICAN: 72 mL/min — AB (ref >90)
GLUCOSE: 112 mg/dL — AB (ref 70–99)
Potassium: 4 mEq/L (ref 3.7–5.3)
Sodium: 139 mEq/L (ref 137–147)
Total Protein: 5.7 g/dL — ABNORMAL LOW (ref 6.0–8.3)

## 2013-03-27 LAB — URINALYSIS, ROUTINE W REFLEX MICROSCOPIC
BILIRUBIN URINE: NEGATIVE
GLUCOSE, UA: NEGATIVE mg/dL
Hgb urine dipstick: NEGATIVE
KETONES UR: 15 mg/dL — AB
Nitrite: NEGATIVE
PROTEIN: NEGATIVE mg/dL
Specific Gravity, Urine: 1.02 (ref 1.005–1.030)
UROBILINOGEN UA: 1 mg/dL (ref 0.0–1.0)
pH: 7.5 (ref 5.0–8.0)

## 2013-03-27 LAB — CBC
HCT: 39.1 % (ref 36.0–46.0)
Hemoglobin: 12.9 g/dL (ref 12.0–15.0)
MCH: 27.1 pg (ref 26.0–34.0)
MCHC: 33 g/dL (ref 30.0–36.0)
MCV: 82.1 fL (ref 78.0–100.0)
PLATELETS: 248 10*3/uL (ref 150–400)
RBC: 4.76 MIL/uL (ref 3.87–5.11)
RDW: 15.7 % — AB (ref 11.5–15.5)
WBC: 7.6 10*3/uL (ref 4.0–10.5)

## 2013-03-27 LAB — URINE MICROSCOPIC-ADD ON

## 2013-03-27 LAB — PRO B NATRIURETIC PEPTIDE: Pro B Natriuretic peptide (BNP): 19.7 pg/mL (ref 0–125)

## 2013-03-27 LAB — POCT I-STAT TROPONIN I: Troponin i, poc: 0 ng/mL (ref 0.00–0.08)

## 2013-03-27 LAB — PREGNANCY, URINE: Preg Test, Ur: NEGATIVE

## 2013-03-27 MED ORDER — ALPRAZOLAM 0.5 MG PO TABS: 0.5000 mg | ORAL_TABLET | Freq: Every evening | ORAL | Status: DC | PRN

## 2013-03-27 MED ORDER — ONDANSETRON 4 MG PO TBDP: 4.0000 mg | ORAL_TABLET | Freq: Three times a day (TID) | ORAL | Status: DC | PRN

## 2013-03-27 MED ORDER — GI COCKTAIL ~~LOC~~
30.0000 mL | Freq: Once | ORAL | Status: AC
  Administered 2013-03-27: 30 mL via ORAL
  Filled 2013-03-27: qty 30

## 2013-03-27 MED ORDER — IBUPROFEN 800 MG PO TABS: 800.0000 mg | ORAL_TABLET | Freq: Three times a day (TID) | ORAL | Status: DC

## 2013-03-27 MED ORDER — NITROGLYCERIN 0.4 MG SL SUBL: 0.4000 mg | SUBLINGUAL_TABLET | SUBLINGUAL | Status: DC | PRN

## 2013-03-27 MED ORDER — ASPIRIN 81 MG PO CHEW
324.0000 mg | CHEWABLE_TABLET | Freq: Once | ORAL | Status: AC
  Administered 2013-03-27: 324 mg via ORAL
  Filled 2013-03-27: qty 4
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
## 2013-03-27 NOTE — Discharge Instructions (Signed)
Please follow up with your primary care physician in 1-2 days. If you do not have one please call the [facility] number listed above. Please take medications as prescribed. Please read all discharge instructions and return precautions.    Chest Pain (Nonspecific) Chest pain has many causes. Your pain could be caused by something serious, such as a heart attack or a blood clot in the lungs. It could also be caused by something less serious, such as a chest bruise or a virus. Follow up with your doctor. More lab tests or other studies may be needed to find the cause of your pain. Most of the time, nonspecific chest pain will improve within 2 to 3 days of rest and mild pain medicine. HOME CARE  For chest bruises, you may put ice on the sore area for 15-20 minutes, 03-04 times a day. Do this only if it makes you feel better.  Put ice in a plastic bag.  Place a towel between the skin and the bag.  Rest for the next 2 to 3 days.  Go back to work if the pain improves.  See your doctor if the pain lasts longer than 1 to 2 weeks.  Only take medicine as told by your doctor.  Quit smoking if you smoke. GET HELP RIGHT AWAY IF:   There is more pain or pain that spreads to the arm, neck, jaw, back, or belly (abdomen).  You have shortness of breath.  You cough more than usual or cough up blood.  You have very bad back or belly pain, feel sick to your stomach (nauseous), or throw up (vomit).  You have very bad weakness.  You pass out (faint).  You have a fever. Any of these problems may be serious and may be an emergency. Do not wait to see if the problems will go away. Get medical help right away. Call your local emergency services 911 in [place] Do not drive yourself to the hospital. MAKE SURE YOU:   Understand these instructions.  Will watch this condition.  Will get help right away if you or your child is not doing well or gets worse. Document Released: 07/22/2007  Document Revised: 04/27/2011 Document Reviewed: 07/22/2007 [facility] Patient Information 2014 [place], [place].

## 2013-03-27 NOTE — ED Provider Notes (Signed)
CSN: [id number]     Arrival date & time 03/27/13  1229 History   First MD Initiated Contact with Patient 03/27/13 1825     Chief Complaint  Patient presents with  . Chest Pain    Ptient has had anxiety,  and panic attacks.  Feels like she is having one now.     (Consider location/radiation/quality/duration/timing/severity/associated sxs/prior Treatment) HPI Comments: Patient is a 38 year old female past medical history significant for migraines presented to the emergency department for central sharp stabbing chest pain without radiation that began around 10 AM this morning. Patient states she had one episode of associated nausea on the waiting room but otherwise has no other associated symptoms. She denies any shortness of breath, emesis, abdominal pain. Patient states she does have a history of anxiety and doesn't get chest pain similar to this, but states this has not past which is unusual for her. Patient has no immediate female cardiac history aside from hypertension. Heart Score is 0. PERC negative.   Patient is a 38 y.o. female presenting with chest pain.  Chest Pain   Past Medical History  Diagnosis Date  . Migraine   . Migraine   . Abnormal Pap smear     colpo with biopsy at age 4  . Infertility     able to become pregnant with Clomid   No past surgical history on file. Family History  Problem Relation Age of Onset  . Diabetes Mother   . Hypertension Mother   . Diabetes Father   . Hypertension Father   . Other Sister     breast cysts  . Breast cancer Paternal Aunt     had mastectomy   History  Substance Use Topics  . Smoking status: Never Smoker   . Smokeless tobacco: Never Used  . Alcohol Use: No   OB History   Grav Para Term Preterm Abortions TAB SAB Ect Mult Living   2 1 1             Review of Systems  Cardiovascular: Positive for chest pain.  All other systems reviewed and are negative.      Allergies  Codeine; Penicillins; and Sulfa  antibiotics  Home Medications   Current Outpatient Rx  Name  Route  Sig  Dispense  Refill  . acetaminophen (TYLENOL) 500 MG tablet   Oral   Take 500 mg by mouth every 6 (six) hours as needed. For pain         . amitriptyline (ELAVIL) 25 MG tablet   Oral   Take 1 tablet (25 mg total) by mouth at bedtime.   30 tablet   6   . SUMAtriptan (IMITREX) 100 MG tablet   Oral   Take 1 tablet (100 mg total) by mouth once as needed for migraine. May repeat x 1 after 2 hours; maximum 2 tabs per day and 8 tabs per month   8 tablet   6   . ibuprofen (ADVIL,MOTRIN) 800 MG tablet   Oral   Take 1 tablet (800 mg total) by mouth 3 (three) times daily.   21 tablet   0   . ondansetron (ZOFRAN ODT) 4 MG disintegrating tablet   Oral   Take 1 tablet (4 mg total) by mouth every 8 (eight) hours as needed for nausea or vomiting.   10 tablet   0    BP 125/87  Pulse 74  Temp(Src) 98.1 F (36.7 C) (Oral)  Resp 20  SpO2 98%  LMP 03/27/2013  Physical Exam  Constitutional: She is oriented to person, place, and time. She appears well-developed and well-nourished. No distress.  HENT:  Head: Normocephalic and atraumatic.  Right Ear: External ear normal.  Left Ear: External ear normal.  Nose: Nose normal.  Mouth/Throat: Oropharynx is clear and moist. No oropharyngeal exudate.  Eyes: Conjunctivae are normal.  Neck: Neck supple.  Cardiovascular: Normal rate, regular rhythm, normal heart sounds and intact distal pulses.   Pulmonary/Chest: Effort normal and breath sounds normal. No respiratory distress. She exhibits no tenderness.  Abdominal: Soft. Bowel sounds are normal. There is no tenderness.  Musculoskeletal: Normal range of motion. She exhibits no edema.  Neurological: She is alert and oriented to person, place, and time.  Skin: Skin is warm and dry. She is not diaphoretic.    ED Course  Procedures (including critical care time) Medications  aspirin chewable tablet 324 mg (324 mg Oral  Given 03/27/13 1852)  gi cocktail (Maalox,Lidocaine,Donnatal) (30 mLs Oral Given 03/27/13 1919)    Labs Review Labs Reviewed  URINALYSIS, ROUTINE W REFLEX MICROSCOPIC - Abnormal; Notable for the following:    APPearance CLOUDY (*)    Ketones, ur 15 (*)    Leukocytes, UA SMALL (*)    All other components within normal limits  COMPREHENSIVE METABOLIC PANEL - Abnormal; Notable for the following:    Glucose, Bld 112 (*)    Calcium 8.2 (*)    Total Protein 5.7 (*)    Albumin 3.0 (*)    Total Bilirubin <0.2 (*)    GFR calc non Af Amer 72 (*)    GFR calc Af Amer 83 (*)    All other components within normal limits  CBC - Abnormal; Notable for the following:    RDW 15.7 (*)    All other components within normal limits  URINE MICROSCOPIC-ADD ON - Abnormal; Notable for the following:    Squamous Epithelial / LPF FEW (*)    Bacteria, UA FEW (*)    All other components within normal limits  PRO B NATRIURETIC PEPTIDE  PREGNANCY, URINE  POCT I-STAT TROPONIN I   Imaging Review Dg Chest 2 View  03/27/2013   CLINICAL DATA:  Mid chest pain.  Cough.  EXAM: CHEST  2 VIEW  COMPARISON:  09/27/2009  FINDINGS: The heart size and mediastinal contours are within normal limits. Both lungs are clear. The visualized skeletal structures are unremarkable.  IMPRESSION: No active cardiopulmonary disease.   Electronically Signed   By: Lajean Manes M.D.   On: 03/27/2013 13:54    EKG Interpretation   None       MDM   Final diagnoses:  Chest pain    Filed Vitals:   03/27/13 2145  BP: 125/87  Pulse: 74  Temp:   Resp: 20    Afebrile, NAD, non-toxic appearing, AAOx4. Patient is to be discharged with recommendation to follow up with PCP in regards to today's hospital visit. Chest pain is not likely of cardiac or pulmonary etiology d/t presentation, perc negative, VSS, no tracheal deviation, no JVD or new murmur, RRR, breath sounds equal bilaterally, EKG without acute abnormalities, negative troponin, and  negative CXR. Pt has been advised to return to the ED is CP becomes exertional, associated with diaphoresis or nausea, radiates to left jaw/arm, worsens or becomes concerning in any way. Pt appears reliable for follow up and is agreeable to discharge.   Case has been discussed with Dr. Tawnya Crook who agrees with the above plan to discharge.  Harlow Mares, PA-C 03/27/13 2148

## 2013-03-27 NOTE — ED Notes (Signed)
Pt sts she ate approximately 10AM, and begin to have sharp upper, central chest pain appx 12pm while walking around the house. Pt sts pain has decreased but is still present. Pt denies SOB, nausea, diaphoresis and or lightheadedness. Pt sts has had similar episode a couple of months ago but it resolved on it own after rest. Pt sts this pain is persistent. Pt sts she has a history of palpitation as a teenager was prescribed an "orange and white" medication but has not taken it in years due to moving to [place].

## 2013-03-27 NOTE — ED Notes (Signed)
Contacted lab again about wait time on CBC. Apologized to pt for delay. Pt resting comfortably in bed. Denies pain/complaint. VSS.

## 2013-03-28 NOTE — ED Provider Notes (Signed)
Medical screening examination/treatment/procedure(s) were performed by non-physician practitioner and as supervising physician I was immediately available for consultation/collaboration.  EKG Interpretation    Date/Time:  Monday March 27 2013 12:37:29 EST Ventricular Rate:  102 PR Interval:  130 QRS Duration: 86 QT Interval:  340 QTC Calculation: 443 R Axis:   48 Text Interpretation:  Sinus tachycardia Otherwise normal ECG Prior EKG w/ normal rate.  Confirmed by Tawnya Crook  MD, [place] [phone]) on 03/28/2013 12:35:26 AM              Neta Ehlers, MD 03/28/13 [phone]

## 2013-06-21 ENCOUNTER — Ambulatory Visit: Payer: Medicaid Other

## 2013-07-12 ENCOUNTER — Ambulatory Visit (INDEPENDENT_AMBULATORY_CARE_PROVIDER_SITE_OTHER): Payer: Medicaid Other | Admitting: Nurse Practitioner

## 2013-07-12 ENCOUNTER — Encounter: Payer: Self-pay | Admitting: Nurse Practitioner

## 2013-07-12 VITALS — BP 132/81 | HR 87 | Ht 67.0 in | Wt 122.0 lb

## 2013-07-12 DIAGNOSIS — G43009 Migraine without aura, not intractable, without status migrainosus: Secondary | ICD-10-CM

## 2013-07-12 MED ORDER — PROPRANOLOL HCL ER 60 MG PO CP24: 60.0000 mg | ORAL_CAPSULE | Freq: Every day | ORAL | Status: DC

## 2013-07-12 MED ORDER — NAPROXEN 500 MG PO TABS: 500.0000 mg | ORAL_TABLET | Freq: Two times a day (BID) | ORAL | Status: DC | PRN

## 2013-07-12 NOTE — Patient Instructions (Addendum)
1. Stop Amitriptyline, take 1/2 tablet for 1 week and then stop. 2. Propranolol LA daily for Migraine prevention. Take 1 at bedtime each night. 3. Naproxen 1 tabelt every 12 hours as needed for headache. 4. Recommend eye exam to follow up on increased eye pressure 5. Follow up in 3 months, sooner as needed.

## 2013-07-12 NOTE — Progress Notes (Signed)
PATIENT: Vicki Mack DOB: [birth year]  REASON FOR VISIT: follow up for Migraine HISTORY FROM: patient  HISTORY OF PRESENT ILLNESS: UPDATE 07/12/13 (LL): Since last visit, headaches are not improved.  She has continued to take Amitriptyline nightly without benefit. Sumatriptan did not relieve the headaches. She reports that headaches come either in the morning hours or the evening, lasting 1-2 hours at at a time.  Sometimes has more than one per day.  Does not identify with any triggers.  Seems more concerned with right knee pain after a fall and cramping in her right toes.  She has not had recent eye exam.  UPDATE 09/26/12: Since last visit patient had one no-show appointment. Then was lost to followup. Patient continues that 6 severe headaches per month, mainly right-sided throbbing with redness in the right eye. Headaches can last a few hours at a time. Patient is off of all medications except over-the-counter Tylenol. In the past we have tried Topamax and gabapentin without relief. We also tried Cymbalta without relief.  UPDATE 07/31/10: Still with headaches. On higher gabapentin, but no relief. Went to ER and given tramadol, but got nausea. Not yet back to Dr. Katy Fitch. Now with depression because she feels taht her headaches are keeping her from getting a job.  UPDATE 07/16/10: Still with headaches, numbness and blurred vision. Saw Dr. Katy Fitch who suspected high risk for glaucoma (increased cup-disc ratio, right worse than left). She will see him back in Aug 2012. Gabapentin is helping her HA.  PRIOR HPI: 38 year old right-handed female with history of palpitations and panic attacks 10 years ago, here for evaluation of headaches, vision changes and numbness x 3 weeks.  Patient reports intermittent episodes of bilateral occipital electrical sensation radiating to her bitemporal regions. This lasts for 15 minutes at a time. During these episodes her vision in both eyes blacks out. She did not  lose consciousness or fall down. Her vision loss last for 5 minutes at a time. These episodes are occurring 1-2 times every 2 days. She has no nausea or vomiting with these episodes.  In addition over the past 3 weeks she is fallen down 3 times. She feels as though her legs give out underneath her without warning. She tends to feel more weakness on her right arm and right leg. In addition her left toes feel numb.   REVIEW OF SYSTEMS: Full 14 system review of systems performed and notable only for headache fevers chest pain or vision loss of vision eye pain constipation, feeling cold, excessive thirst, memory loss, headaches, numbness, speech difficulty, dizziness, depression, confusion, daytime sleepiness, change in appetite.  ALLERGIES: Allergies  Allergen Reactions  . Codeine Rash  . Penicillins Rash  . Sulfa Antibiotics Rash    HOME MEDICATIONS: Outpatient Prescriptions Prior to Visit  Medication Sig Dispense Refill  . acetaminophen (TYLENOL) 500 MG tablet Take 500 mg by mouth every 6 (six) hours as needed. For pain      . ALPRAZolam (XANAX) 0.5 MG tablet Take 1 tablet (0.5 mg total) by mouth at bedtime as needed for anxiety or sleep.  6 tablet  0  . ibuprofen (ADVIL,MOTRIN) 800 MG tablet Take 1 tablet (800 mg total) by mouth 3 (three) times daily.  21 tablet  0  . ondansetron (ZOFRAN ODT) 4 MG disintegrating tablet Take 1 tablet (4 mg total) by mouth every 8 (eight) hours as needed for nausea or vomiting.  10 tablet  0  . SUMAtriptan (IMITREX) 100 MG tablet Take  1 tablet (100 mg total) by mouth once as needed for migraine. May repeat x 1 after 2 hours; maximum 2 tabs per day and 8 tabs per month  8 tablet  6  . amitriptyline (ELAVIL) 25 MG tablet Take 1 tablet (25 mg total) by mouth at bedtime.  30 tablet  6   No facility-administered medications prior to visit.     PHYSICAL EXAM  Filed Vitals:   07/12/13 0834  BP: 132/81  Pulse: 87  Height: 5\' 7"  (1.702 m)  Weight: 122 lb  (55.339 kg)   Body mass index is 19.1 kg/(m^2). No exam data present   Generalized: Well developed, in no acute distress  Head: normocephalic and atraumatic. Oropharynx benign  Neck: Supple, no carotid bruits  Cardiac: Regular rate rhythm, no murmur  Musculoskeletal: No deformity   Neurological examination  Mentation: Alert oriented to time, place, history taking. Follows all commands speech and language fluent Cranial nerve II-XII: Fundoscopic exam reveals sharp disc margins.Pupils were equal round reactive to light extraocular movements were full, visual field were full on confrontational test. Facial sensation and strength were normal. hearing was intact to finger rubbing bilaterally. Uvula tongue midline. head turning and shoulder shrug and were normal and symmetric.Tongue protrusion into cheek strength was normal. Motor: The motor testing reveals 5 over 5 strength of all 4 extremities. Good symmetric motor tone is noted throughout.  Sensory: Sensory testing is intact to pinprick, soft touch, vibration sensation, and position sense on all 4 extremities. No evidence of extinction is noted.  Coordination: Cerebellar testing reveals good finger-nose-finger and heel-to-shin bilaterally.  Gait and station: Gait is normal. Tandem gait is normal. Romberg is negative. No drift is seen.  Reflexes: Deep tendon reflexes are symmetric and normal bilaterally. Toes are downgoing bilaterally.   08/15/11 MRI BRAIN - normal  ASSESSMENT AND PLAN 38 y.o. year old female here with chronic headache, some migraine features. Patient has tried and failed gabapentin, Topamax, Amitriptyline and Cymbalta in the past.   Dx: migraine without aura   PLAN:  1. Stop Amitriptyline, take 1/2 tablet for 1 week and then stop. 2. Propranolol LA daily for Migraine prevention. 3. Naproxen q 12 as needed for acute headache. 4. Recommend eye exam to follow up on increased eye pressure 5. Urged patient to get established  with a Primary Care Physician for knee pain, foot pain, etc. 6. Follow up in 3 months, sooner as needed.  Meds ordered this encounter  Medications  . propranolol ER (INDERAL LA) 60 MG 24 hr capsule    Sig: Take 1 capsule (60 mg total) by mouth at bedtime.    Dispense:  30 capsule    Refill:  2    Order Specific Question:  Supervising Provider    Answer:  Andrey Spearman R [[id number]]  . naproxen (NAPROSYN) 500 MG tablet    Sig: Take 1 tablet (500 mg total) by mouth every 12 (twelve) hours as needed.    Dispense:  60 tablet    Refill:  2    Order Specific Question:  Supervising Provider    Answer:  Penni Bombard [[id number]]   Return in about 3 months (around 10/12/2013).  Philmore Pali, MSN, NP-C 07/12/2013, 9:08 AM [facility] [address] [place], [place] [address] [phone]  Note: This document was prepared with digital dictation and possible smart phrase technology. Any transcriptional errors that result from this process are unintentional.

## 2013-07-14 ENCOUNTER — Inpatient Hospital Stay (HOSPITAL_COMMUNITY)
Admission: AD | Admit: 2013-07-14 | Discharge: 2013-07-14 | Disposition: A | Discharge status: Home / Self Care | Payer: Medicaid Other | Source: Ambulatory Visit | Attending: Obstetrics & Gynecology | Admitting: Obstetrics & Gynecology

## 2013-07-14 ENCOUNTER — Encounter (HOSPITAL_COMMUNITY): Payer: Self-pay | Admitting: Emergency Medicine

## 2013-07-14 ENCOUNTER — Telehealth: Payer: Self-pay | Admitting: Nurse Practitioner

## 2013-07-14 ENCOUNTER — Encounter (HOSPITAL_COMMUNITY): Payer: Self-pay | Admitting: Medical

## 2013-07-14 ENCOUNTER — Inpatient Hospital Stay (HOSPITAL_COMMUNITY): Payer: Medicaid Other

## 2013-07-14 ENCOUNTER — Other Ambulatory Visit: Payer: Self-pay | Admitting: Neurology

## 2013-07-14 ENCOUNTER — Telehealth: Payer: Self-pay | Admitting: Neurology

## 2013-07-14 ENCOUNTER — Emergency Department (HOSPITAL_COMMUNITY)
Admission: EM | Admit: 2013-07-14 | Discharge: 2013-07-14 | Disposition: A | Discharge status: Home / Self Care | Payer: Medicaid Other | Source: Home / Self Care | Attending: Family Medicine | Admitting: Family Medicine

## 2013-07-14 DIAGNOSIS — O200 Threatened abortion: Secondary | ICD-10-CM

## 2013-07-14 DIAGNOSIS — O209 Hemorrhage in early pregnancy, unspecified: Secondary | ICD-10-CM | POA: Insufficient documentation

## 2013-07-14 DIAGNOSIS — B9689 Other specified bacterial agents as the cause of diseases classified elsewhere: Secondary | ICD-10-CM | POA: Insufficient documentation

## 2013-07-14 DIAGNOSIS — O2390 Unspecified genitourinary tract infection in pregnancy, unspecified trimester: Secondary | ICD-10-CM | POA: Insufficient documentation

## 2013-07-14 DIAGNOSIS — N760 Acute vaginitis: Secondary | ICD-10-CM | POA: Insufficient documentation

## 2013-07-14 DIAGNOSIS — R109 Unspecified abdominal pain: Secondary | ICD-10-CM | POA: Insufficient documentation

## 2013-07-14 DIAGNOSIS — A499 Bacterial infection, unspecified: Secondary | ICD-10-CM

## 2013-07-14 LAB — CBC
HCT: 34.7 % — ABNORMAL LOW (ref 36.0–46.0)
Hemoglobin: 11.2 g/dL — ABNORMAL LOW (ref 12.0–15.0)
MCH: 25.2 pg — AB (ref 26.0–34.0)
MCHC: 32.3 g/dL (ref 30.0–36.0)
MCV: 78 fL (ref 78.0–100.0)
Platelets: 295 10*3/uL (ref 150–400)
RBC: 4.45 MIL/uL (ref 3.87–5.11)
RDW: 16 % — ABNORMAL HIGH (ref 11.5–15.5)
WBC: 8.3 10*3/uL (ref 4.0–10.5)

## 2013-07-14 LAB — URINALYSIS, ROUTINE W REFLEX MICROSCOPIC
Bilirubin Urine: NEGATIVE
GLUCOSE, UA: NEGATIVE mg/dL
Ketones, ur: NEGATIVE mg/dL
Nitrite: NEGATIVE
Protein, ur: NEGATIVE mg/dL
SPECIFIC GRAVITY, URINE: 1.01 (ref 1.005–1.030)
Urobilinogen, UA: 0.2 mg/dL (ref 0.0–1.0)
pH: 6.5 (ref 5.0–8.0)

## 2013-07-14 LAB — URINE MICROSCOPIC-ADD ON

## 2013-07-14 LAB — POCT PREGNANCY, URINE: Preg Test, Ur: POSITIVE — AB

## 2013-07-14 LAB — HCG, QUANTITATIVE, PREGNANCY: hCG, Beta Chain, Quant, S: 17743 m[IU]/mL — ABNORMAL HIGH (ref <5)

## 2013-07-14 LAB — WET PREP, GENITAL
TRICH WET PREP: NONE SEEN
YEAST WET PREP: NONE SEEN

## 2013-07-14 LAB — HIV ANTIBODY (ROUTINE TESTING W REFLEX): HIV 1&2 Ab, 4th Generation: NONREACTIVE

## 2013-07-14 MED ORDER — METRONIDAZOLE 500 MG PO TABS: 500.0000 mg | ORAL_TABLET | Freq: Two times a day (BID) | ORAL | Status: DC

## 2013-07-14 MED ORDER — MAGNESIUM OXIDE 400 MG PO TABS: 400.0000 mg | ORAL_TABLET | Freq: Every day | ORAL | Status: DC

## 2013-07-14 NOTE — ED Notes (Signed)
Spoke with    Rushie Nyhan  Nurse  At  [facility]  About  pts  Impending arrival

## 2013-07-14 NOTE — Telephone Encounter (Signed)
Patient just found out she is pregnant and would like to know if it is safe to take the propranolol or if something else should be prescribed.  Please advise.  Thank you.

## 2013-07-14 NOTE — ED Provider Notes (Signed)
CSN: [id number]     Arrival date & time 07/14/13  8676 History   First MD Initiated Contact with Patient 07/14/13 [phone]     Chief Complaint  Patient presents with  . Nausea   (Consider location/radiation/quality/duration/timing/severity/associated sxs/prior Treatment) Patient is a 38 y.o. female presenting with female genitourinary complaint.  Female GU Problem This is a new problem. The current episode started 1 to 2 hours ago (lmp 3/31 , began spotting and cramping this am, no bcp.). Pertinent negatives include no abdominal pain.    Past Medical History  Diagnosis Date  . Migraine   . Migraine   . Abnormal Pap smear     colpo with biopsy at age 64  . Infertility     able to become pregnant with Clomid   History reviewed. No pertinent past surgical history. Family History  Problem Relation Age of Onset  . Diabetes Mother   . Hypertension Mother   . Diabetes Father   . Hypertension Father   . Other Sister     breast cysts  . Breast cancer Paternal Aunt     had mastectomy   History  Substance Use Topics  . Smoking status: Never Smoker   . Smokeless tobacco: Never Used  . Alcohol Use: No   OB History   Grav Para Term Preterm Abortions TAB SAB Ect Mult Living   2 1 1             Review of Systems  Constitutional: Negative.   Gastrointestinal: Negative for abdominal pain.  Genitourinary: Positive for menstrual problem and pelvic pain.    Allergies  Codeine; Penicillins; and Sulfa antibiotics  Home Medications   Prior to Admission medications   Medication Sig Start Date End Date Taking? Authorizing Provider  acetaminophen (TYLENOL) 500 MG tablet Take 500 mg by mouth every 6 (six) hours as needed. For pain    Historical Provider, MD  ALPRAZolam Duanne Moron) 0.5 MG tablet Take 1 tablet (0.5 mg total) by mouth at bedtime as needed for anxiety or sleep. 03/27/13   Jennifer L Piepenbrink, PA-C  ibuprofen (ADVIL,MOTRIN) 800 MG tablet Take 1 tablet (800 mg total) by mouth 3  (three) times daily. 03/27/13   Jennifer L Piepenbrink, PA-C  naproxen (NAPROSYN) 500 MG tablet Take 1 tablet (500 mg total) by mouth every 12 (twelve) hours as needed. 07/12/13   Philmore Pali, NP  ondansetron (ZOFRAN ODT) 4 MG disintegrating tablet Take 1 tablet (4 mg total) by mouth every 8 (eight) hours as needed for nausea or vomiting. 03/27/13   Jennifer L Piepenbrink, PA-C  propranolol ER (INDERAL LA) 60 MG 24 hr capsule Take 1 capsule (60 mg total) by mouth at bedtime. 07/12/13   Philmore Pali, NP  SUMAtriptan (IMITREX) 100 MG tablet Take 1 tablet (100 mg total) by mouth once as needed for migraine. May repeat x 1 after 2 hours; maximum 2 tabs per day and 8 tabs per month 09/26/12   Vikram R Penumalli, MD   BP 122/85  Pulse 85  Temp(Src) 98.2 F (36.8 C) (Oral)  Resp 14  SpO2 99%  LMP 05/16/2013 Physical Exam  Nursing note and vitals reviewed. Constitutional: She appears well-developed and well-nourished.  Abdominal: Soft. Bowel sounds are normal. There is tenderness in the suprapubic area. There is no guarding.  Skin: Skin is warm and dry.    ED Course  Procedures (including critical care time) Labs Review Labs Reviewed  POCT PREGNANCY, URINE - Abnormal; Notable for the following:  Preg Test, Ur POSITIVE (*)    All other components within normal limits    Imaging Review No results found.   MDM   1. Threatened miscarriage       Billy Fischer, MD 07/14/13 [phone]

## 2013-07-14 NOTE — Discharge Instructions (Signed)
[facility] for [facility]. Of possible miscarriage.

## 2013-07-14 NOTE — ED Notes (Addendum)
Pt  Reports  Symptoms  Of  Nausea   With  Dry  Heaves         Also is  Late on her period   Noticed  Some  Spotting  And  Low  abd   Cramping                  Ambulated  With an  Upright fluid  Gait    Appears in no  Acute distress

## 2013-07-14 NOTE — MAU Note (Signed)
Was at urgent care, no period since March, nauseated.  +preg test there.  Noted spotting when used restroom at urgent care.  Has been having period like cramps, sent here for further eval.

## 2013-07-14 NOTE — Discharge Instructions (Signed)
Bacterial Vaginosis Bacterial vaginosis is an infection of the vagina. It happens when too many of certain germs (bacteria) grow in the vagina. HOME CARE  Take your medicine as told by your doctor.  Finish your medicine even if you start to feel better.  Do not have sex until you finish your medicine and are better.  Tell your sex partner that you have an infection. They should see their doctor for treatment.  Practice safe sex. Use condoms. Have only one sex partner. GET HELP IF:  You are not getting better after 3 days of treatment.  You have more grey fluid (discharge) coming from your vagina than before.  You have more pain than before.  You have a fever. MAKE SURE YOU:   Understand these instructions.  Will watch your condition.  Will get help right away if you are not doing well or get worse. Document Released: 11/12/2007 Document Revised: 11/23/2012 Document Reviewed: 09/14/2012 [facility] Patient Information 2014 [place]. Pelvic Rest Pelvic rest is sometimes recommended for women when:   The placenta is partially or completely covering the opening of the cervix (placenta previa).  There is bleeding between the uterine wall and the amniotic sac in the first trimester (subchorionic hemorrhage).  The cervix begins to open without labor starting (incompetent cervix, cervical insufficiency).  The labor is too early (preterm labor). HOME CARE INSTRUCTIONS  Do not have sexual intercourse, stimulation, or an orgasm.  Do not use tampons, douche, or put anything in the vagina.  Do not lift anything over 10 pounds (4.5 kg).  Avoid strenuous activity or straining your pelvic muscles. SEEK MEDICAL CARE IF:  You have any vaginal bleeding during pregnancy. Treat this as a potential emergency.  You have cramping pain felt low in the stomach (stronger than menstrual cramps).  You notice vaginal discharge (watery, mucus, or bloody).  You have a low, dull  backache.  There are regular contractions or uterine tightening. SEEK IMMEDIATE MEDICAL CARE IF: You have vaginal bleeding and have placenta previa.  Document Released: 05/30/2010 Document Revised: 04/27/2011 Document Reviewed: 05/30/2010 [facility] Patient Information 2014 [place].

## 2013-07-14 NOTE — MAU Provider Note (Signed)
History     CSN: [id number]  Arrival date and time: 07/14/13 0907   First Provider Initiated Contact with Patient 07/14/13 1027      Chief Complaint  Patient presents with  . Vaginal Bleeding  . Abdominal Cramping   HPI Ms. Vicki Mack is a 38 y.o. G3P1010 at [redacted]w[redacted]d who presents to MAU today from Urgent Care with complaint of cramping and spotting. Patient had +UPT at Urgent Care, she was not aware of pregnancy prior to that visit. She states that cramping started last night and she noted spotting with wiping this morning. She states last intercourse was this morning prior to bleeding. She denies vaginal discharge, fever, UTI symptoms today. She has had mild nausea without vomiting or diarrhea. She does endorse frequent constipation. Last BM was yesterday and normal.  OB History   Grav Para Term Preterm Abortions TAB SAB Ect Mult Living   3 1 1  1  1          Past Medical History  Diagnosis Date  . Migraine   . Migraine   . Abnormal Pap smear     colpo with biopsy at age 38  . Infertility     able to become pregnant with Clomid    History reviewed. No pertinent past surgical history.  Family History  Problem Relation Age of Onset  . Diabetes Mother   . Hypertension Mother   . Diabetes Father   . Hypertension Father   . Other Sister     breast cysts  . Breast cancer Paternal Aunt     had mastectomy    History  Substance Use Topics  . Smoking status: Never Smoker   . Smokeless tobacco: Never Used  . Alcohol Use: No    Allergies:  Allergies  Allergen Reactions  . Codeine Rash  . Penicillins Rash  . Sulfa Antibiotics Rash    No prescriptions prior to admission    Review of Systems  Constitutional: Negative for fever.  Gastrointestinal: Positive for nausea and abdominal pain. Negative for vomiting, diarrhea and constipation.  Genitourinary: Negative for dysuria, urgency and frequency.       Neg - vaginal discharge + vaginal bleeding    Physical Exam   Blood pressure 132/90, pulse 89, temperature 98.5 F (36.9 C), resp. rate 16, height 5' 5.5" (1.664 m), weight 121 lb (54.885 kg), last menstrual period 05/16/2013.  Physical Exam  Constitutional: She is oriented to person, place, and time. She appears well-developed and well-nourished. No distress.  HENT:  Head: Normocephalic and atraumatic.  Cardiovascular: Normal rate, regular rhythm and normal heart sounds.   Respiratory: Effort normal and breath sounds normal. No respiratory distress.  GI: Soft. Bowel sounds are normal. She exhibits no distension and no mass. There is no tenderness. There is no rebound and no guarding.  Genitourinary: Uterus is not enlarged and not tender. Cervix exhibits no motion tenderness, no discharge and no friability. Right adnexum displays no mass and no tenderness. Left adnexum displays no mass and no tenderness. There is bleeding (scant pink noted) around the vagina. Vaginal discharge (small amount of thin, white discharge noted) found.  Neurological: She is alert and oriented to person, place, and time.  Skin: Skin is warm and dry. No erythema.  Psychiatric: She has a normal mood and affect.   Results for orders placed during the hospital encounter of 07/14/13 (from the past 24 hour(s))  URINALYSIS, ROUTINE W REFLEX MICROSCOPIC     Status: Abnormal  Collection Time    07/14/13  9:15 AM      Result Value Ref Range   Color, Urine YELLOW  YELLOW   APPearance HAZY (*) CLEAR   Specific Gravity, Urine 1.010  1.005 - 1.030   pH 6.5  5.0 - 8.0   Glucose, UA NEGATIVE  NEGATIVE mg/dL   Hgb urine dipstick LARGE (*) NEGATIVE   Bilirubin Urine NEGATIVE  NEGATIVE   Ketones, ur NEGATIVE  NEGATIVE mg/dL   Protein, ur NEGATIVE  NEGATIVE mg/dL   Urobilinogen, UA 0.2  0.0 - 1.0 mg/dL   Nitrite NEGATIVE  NEGATIVE   Leukocytes, UA MODERATE (*) NEGATIVE  URINE MICROSCOPIC-ADD ON     Status: Abnormal   Collection Time    07/14/13  9:15 AM       Result Value Ref Range   Squamous Epithelial / LPF FEW (*) RARE   WBC, UA 0-2  <3 WBC/hpf   Bacteria, UA RARE  RARE  CBC     Status: Abnormal   Collection Time    07/14/13  9:22 AM      Result Value Ref Range   WBC 8.3  4.0 - 10.5 K/uL   RBC 4.45  3.87 - 5.11 MIL/uL   Hemoglobin 11.2 (*) 12.0 - 15.0 g/dL   HCT 34.7 (*) 36.0 - 46.0 %   MCV 78.0  78.0 - 100.0 fL   MCH 25.2 (*) 26.0 - 34.0 pg   MCHC 32.3  30.0 - 36.0 g/dL   RDW 16.0 (*) 11.5 - 15.5 %   Platelets 295  150 - 400 K/uL  HCG, QUANTITATIVE, PREGNANCY     Status: Abnormal   Collection Time    07/14/13  9:22 AM      Result Value Ref Range   hCG, Beta Chain, Quant, S 17743 (*) <5 mIU/mL  WET PREP, GENITAL     Status: Abnormal   Collection Time    07/14/13 10:35 AM      Result Value Ref Range   Yeast Wet Prep HPF POC NONE SEEN  NONE SEEN   Trich, Wet Prep NONE SEEN  NONE SEEN   Clue Cells Wet Prep HPF POC FEW (*) NONE SEEN   WBC, Wet Prep HPF POC FEW (*) NONE SEEN   US Ob Comp Less 14 Wks  07/14/2013   CLINICAL DATA:  Cramping.  EXAM: OBSTETRIC <14 WK [place] AND TRANSVAGINAL OB US  TECHNIQUE: Both transabdominal and transvaginal ultrasound examinations were performed for complete evaluation of the gestation as well as the maternal uterus, adnexal regions, and pelvic cul-de-sac. Transvaginal technique was performed to assess early pregnancy.  COMPARISON:  Ultrasound 07/15/2011.  FINDINGS: Intrauterine gestational sac: Single.  Yolk sac:  None visualized.  Embryo:  None visualized.  Cardiac Activity: None visualized.  MSD:  1.1 cm     5 w   6  d  [place] EDC: 03/10/2014.  Maternal uterus/adnexae: Tiny subchorionic hemorrhage noted inferiorly. No free fluid. No focal ovarian abnormality.  IMPRESSION: 1. Small intrauterine gestational sac. No fetal pole noted. Findings consistent with 5 weeks 6 day pregnancy. 2. Tiny subchorionic hemorrhage.   Electronically Signed   By: [place]   On: 07/14/2013 11:56   US Ob  Transvaginal  07/14/2013   CLINICAL DATA:  Cramping.  EXAM: OBSTETRIC <14 WK [place] AND TRANSVAGINAL OB US  TECHNIQUE: Both transabdominal and transvaginal ultrasound examinations were performed for complete evaluation of the gestation as well as the maternal uterus, adnexal regions, and  pelvic cul-de-sac. Transvaginal technique was performed to assess early pregnancy.  COMPARISON:  Ultrasound 07/15/2011.  FINDINGS: Intrauterine gestational sac: Single.  Yolk sac:  None visualized.  Embryo:  None visualized.  Cardiac Activity: None visualized.  MSD:  1.1 cm     5 w   6  d  [place] EDC: 03/10/2014.  Maternal uterus/adnexae: Tiny subchorionic hemorrhage noted inferiorly. No free fluid. No focal ovarian abnormality.  IMPRESSION: 1. Small intrauterine gestational sac. No fetal pole noted. Findings consistent with 5 weeks 6 day pregnancy. 2. Tiny subchorionic hemorrhage.   Electronically Signed   By: Marcello Moores  Register   On: 07/14/2013 11:56     MAU Course  Procedures None  MDM +UPT UA, wet prep, GC/Chlamydia, CBC, quant hCG and [place] today  Assessment and Plan  A: IUGS at [redacted]w[redacted]d without YS or FP Vaginal bleeding in pregnancy prior to [redacted] weeks gestation Bacterial Vaginosis  P: Discharge home Rx for Flagyl sent to patient's pharmacy Bleeding/ectopic precautions discussed Patient advised to return to MAU in 48 hours for follow-up labs or sooner if symptoms change or worsen  Farris Has, PA-C  07/14/2013, 1:55 PM

## 2013-07-14 NOTE — Telephone Encounter (Signed)
Called patient, no answer, left message.  She should not continue with Propranolol LA, Amitriptyline, or Sumatriptan during pregnancy, all are category C.  Unfortunately, all that is relatively safe to take is Tylenol.  Advised to call back with questions.

## 2013-07-14 NOTE — Telephone Encounter (Signed)
Patient concerned about propranolol ER (INDERAL LA) 60 MG 24 hr capsule Rx.  She found out she's [redacted] weeks pregnant and want know if medication is safe to take.  Please call and advise

## 2013-07-14 NOTE — Telephone Encounter (Signed)
Returned call from patient who reports she is [redacted] weeks pregnant and questions whether she can start propranolol and imitrex/naproxen for her migraines. Counseled her that she should not start these medications. Sent prescription for magnesium 400mg  daily.

## 2013-07-15 LAB — GC/CHLAMYDIA PROBE AMP
CT PROBE, AMP APTIMA: NEGATIVE
GC PROBE AMP APTIMA: NEGATIVE

## 2013-07-15 LAB — CULTURE, OB URINE: Colony Count: 6000

## 2013-07-16 ENCOUNTER — Inpatient Hospital Stay (HOSPITAL_COMMUNITY): Payer: Medicaid Other

## 2013-07-16 ENCOUNTER — Inpatient Hospital Stay (HOSPITAL_COMMUNITY)
Admission: AD | Admit: 2013-07-16 | Discharge: 2013-07-16 | Disposition: A | Discharge status: Home / Self Care | Payer: Medicaid Other | Source: Ambulatory Visit | Attending: Obstetrics and Gynecology | Admitting: Obstetrics and Gynecology

## 2013-07-16 DIAGNOSIS — O200 Threatened abortion: Secondary | ICD-10-CM | POA: Insufficient documentation

## 2013-07-16 LAB — HCG, QUANTITATIVE, PREGNANCY: HCG, BETA CHAIN, QUANT, S: 22294 m[IU]/mL — AB (ref <5)

## 2013-07-16 MED ORDER — ACETAMINOPHEN 325 MG PO TABS
650.0000 mg | ORAL_TABLET | Freq: Once | ORAL | Status: AC
  Administered 2013-07-16: 650 mg via ORAL
  Filled 2013-07-16: qty 2

## 2013-07-16 NOTE — MAU Note (Signed)
Pt presents to MAU for repeat BHCG follow up. States mild abdominal cramping, no vagina bleeding

## 2013-07-16 NOTE — MAU Provider Note (Signed)
Attestation of Attending Supervision of Advanced Practitioner: Evaluation and management procedures were performed by the PA/NP/CNM/OB Fellow under my supervision/collaboration. Chart reviewed and agree with management and plan.  Mallory Shirk V 07/16/2013 2:01 PM

## 2013-07-16 NOTE — MAU Provider Note (Signed)
History     CSN: [id number]  Arrival date and time: 07/16/13 0803   None     Chief Complaint  Patient presents with  . Labs Only   HPI Comments: Vicki Mack 38 y.o. [redacted]w[redacted]d G3P1010 presents to MAU for BHCG follow up. She was seen 5/29 with quant of 17,743 and ultrasound showed IUGS, no YS or fetal pole. Today she is having abdominal pain 4/10 mostly on left side and no vaginal bleeding. This is a desired pregnancy.      Past Medical History  Diagnosis Date  . Migraine   . Migraine   . Abnormal Pap smear     colpo with biopsy at age 62  . Infertility     able to become pregnant with Clomid    No past surgical history on file.  Family History  Problem Relation Age of Onset  . Diabetes Mother   . Hypertension Mother   . Diabetes Father   . Hypertension Father   . Other Sister     breast cysts  . Breast cancer Paternal Aunt     had mastectomy    History  Substance Use Topics  . Smoking status: Never Smoker   . Smokeless tobacco: Never Used  . Alcohol Use: No    Allergies:  Allergies  Allergen Reactions  . Codeine Rash  . Penicillins Rash  . Sulfa Antibiotics Rash    Prescriptions prior to admission  Medication Sig Dispense Refill  . ALPRAZolam (XANAX) 0.5 MG tablet Take 1 tablet (0.5 mg total) by mouth at bedtime as needed for anxiety or sleep.  6 tablet  0  . BIOTIN PO Take 1 capsule by mouth daily.      . magnesium oxide (MAG-OX) 400 MG tablet Take 1 tablet (400 mg total) by mouth daily.  30 tablet  3  . metroNIDAZOLE (FLAGYL) 500 MG tablet Take 1 tablet (500 mg total) by mouth 2 (two) times daily.  14 tablet  0    Review of Systems  Constitutional: Negative.   HENT: Negative.   Respiratory: Negative.   Cardiovascular: Negative.   Gastrointestinal: Positive for abdominal pain.  Genitourinary: Negative.   Musculoskeletal: Negative.   Skin: Negative.   Neurological: Negative.   Psychiatric/Behavioral: Negative.    Physical Exam    Blood pressure 124/85, pulse 86, temperature 98.7 F (37.1 C), resp. rate 16, last menstrual period 05/16/2013.  Physical Exam  Constitutional: She is oriented to person, place, and time. She appears well-developed and well-nourished. No distress.  HENT:  Head: Normocephalic and atraumatic.  Cardiovascular: Normal rate, regular rhythm and normal heart sounds.   Respiratory: Effort normal and breath sounds normal.  GI: Soft. Bowel sounds are normal. She exhibits distension. There is tenderness. There is no guarding.  Genitourinary:  Not examined  Musculoskeletal: Normal range of motion.  Neurological: She is alert and oriented to person, place, and time.  Skin: Skin is warm and dry.  Psychiatric: She has a normal mood and affect. Her behavior is normal. Judgment normal.   Results for orders placed during the hospital encounter of 07/16/13 (from the past 24 hour(s))  HCG, QUANTITATIVE, PREGNANCY     Status: Abnormal   Collection Time    07/16/13  8:16 AM      Result Value Ref Range   hCG, Beta Chain, [name] 22294 (*) <5 mIU/mL   US Ob Comp Less 14 Wks  07/14/2013   CLINICAL DATA:  Cramping.  EXAM: OBSTETRIC <14 WK  [place] AND TRANSVAGINAL OB US  TECHNIQUE: Both transabdominal and transvaginal ultrasound examinations were performed for complete evaluation of the gestation as well as the maternal uterus, adnexal regions, and pelvic cul-de-sac. Transvaginal technique was performed to assess early pregnancy.  COMPARISON:  Ultrasound 07/15/2011.  FINDINGS: Intrauterine gestational sac: Single.  Yolk sac:  None visualized.  Embryo:  None visualized.  Cardiac Activity: None visualized.  MSD:  1.1 cm     5 w   6  d  [place] EDC: 03/10/2014.  Maternal uterus/adnexae: Tiny subchorionic hemorrhage noted inferiorly. No free fluid. No focal ovarian abnormality.  IMPRESSION: 1. Small intrauterine gestational sac. No fetal pole noted. Findings consistent with 5 weeks 6 day pregnancy. 2. Tiny subchorionic  hemorrhage.   Electronically Signed   By: Marcello Moores  Register   On: 07/14/2013 11:56   US Ob Transvaginal  07/16/2013   CLINICAL DATA:  Left-sided abdominal pain, pregnant (B-HCG - 22,294)  EXAM: TRANSVAGINAL OB ULTRASOUND  TECHNIQUE: Transvaginal ultrasound was performed for complete evaluation of the gestation as well as the maternal uterus, adnexal regions, and pelvic cul-de-sac.  COMPARISON:  Pelvic ultrasound -07/14/2013  FINDINGS: Intrauterine gestational sac: Visualized  Yolk sac:  Not visualized  Embryo:  Not visualized  MSD: 13  mm   6 w   1  d               [place] EDC: 03/10/2014  Maternal uterus/adnexae:  Right ovary - normal in size measured 3.0 x 3.0 x 2.0 cm - no discrete right-sided ovarian or adnexal mass.  Left ovary - normal in size measuring 3.2 x 3.6 x 2.7 cm - note is made of an approximately 2.0 x 2.1 cm hypoechoic structure within the left ovary which is favored to represent a minimal amount of hemorrhage within a corpus luteal cyst.  IMPRESSION: 1. Re demonstrated possible intrauterine gestational sac (now measuring 13 mm in diameter; previously, 11 mm) without identification of a yolk sac or fetal pole - differential considerations again include a failed IUP, ectopic pregnancy and early viable intrauterine pregnancy. Serial beta HCGs and followup pelvic ultrasound in 1 week is recommended to evaluate for development of a yolk sac and/or an embryo. 2. Suspected minimal amount of hemorrhage within approximately 2.1 cm left-sided corpus luteal cyst.   Electronically Signed   By: Sandi Mariscal M.D.   On: 07/16/2013 10:36   US Ob Transvaginal  07/14/2013   CLINICAL DATA:  Cramping.  EXAM: OBSTETRIC <14 WK [place] AND TRANSVAGINAL OB US  TECHNIQUE: Both transabdominal and transvaginal ultrasound examinations were performed for complete evaluation of the gestation as well as the maternal uterus, adnexal regions, and pelvic cul-de-sac. Transvaginal technique was performed to assess early pregnancy.   COMPARISON:  Ultrasound 07/15/2011.  FINDINGS: Intrauterine gestational sac: Single.  Yolk sac:  None visualized.  Embryo:  None visualized.  Cardiac Activity: None visualized.  MSD:  1.1 cm     5 w   6  d  [place] EDC: 03/10/2014.  Maternal uterus/adnexae: Tiny subchorionic hemorrhage noted inferiorly. No free fluid. No focal ovarian abnormality.  IMPRESSION: 1. Small intrauterine gestational sac. No fetal pole noted. Findings consistent with 5 weeks 6 day pregnancy. 2. Tiny subchorionic hemorrhage.   Electronically Signed   By: Marcello Moores  Register   On: 07/14/2013 11:56    MAU Course  Procedures  MDM  Spoke with Dr Glo Herring who reviewed the past ultrasound and advised a repeat ultrasound. He reviewed ultrasound and does not feel its ectopic.  Advised to repeat U/S and Quant in 7 days Tynenol x1 for discomfort Assessment and Plan   A: Threatened miscarriage  P: Miscarriage precautions Note for work today Repeat U/S and quant 7 days Return to MAU if pain or bleeding worsens  Connye Burkitt Anacleto Batterman 07/16/2013, 9:24 AM

## 2013-07-16 NOTE — Discharge Instructions (Signed)
Threatened Miscarriage   A threatened miscarriage is a pregnancy that may end. It may be marked by bleeding during the first 20 weeks of pregnancy. Often, the pregnancy can continue without any more problems. You may be asked to stop:  · Having sex (intercourse).  · Having orgasms.  · Using tampons.  · Exercising.  · Doing heavy physical activity and work.  HOME CARE   · Your doctor may tell you to take bed rest and to stop activities and work.  · Write down the number of pads you use each day. Write down how often you change pads. Write down how soaked they are.  · Follow your doctor's advice for follow-up visits and tests.  · If your blood type is Rh-negative and the father's blood is Rh-positive (or is not known), you may get a shot to protect the baby.  · If you have a miscarriage, save all the tissue you pass in a container. Take the container to your doctor.  GET HELP RIGHT AWAY IF:   · You have bad cramps or pain in your belly (abdomen), lower belly, or back.  · You have a fever or chills.  · Your bleeding gets worse or you pass large clots of blood or tissue. Save this tissue to show your doctor.  · You feel lightheaded, weak, dizzy, or pass out (faint).  · You have a gush of fluid from your vagina.  MAKE SURE YOU:   · Understand these instructions.  · Will watch your condition.  · Will get help right away if you are not doing well or get worse.  Document Released: 01/16/2008 Document Revised: 04/27/2011 Document Reviewed: 02/18/2009  [employer]® Patient Information ©2014 [employer].

## 2013-07-24 ENCOUNTER — Other Ambulatory Visit (HOSPITAL_COMMUNITY): Payer: Self-pay | Admitting: Nurse Practitioner

## 2013-07-24 ENCOUNTER — Ambulatory Visit (HOSPITAL_COMMUNITY)
Admission: RE | Admit: 2013-07-24 | Discharge: 2013-07-24 | Disposition: A | Discharge status: Home / Self Care | Payer: Medicaid Other | Source: Ambulatory Visit | Attending: Nurse Practitioner | Admitting: Nurse Practitioner

## 2013-07-24 ENCOUNTER — Inpatient Hospital Stay (HOSPITAL_COMMUNITY)
Admission: AD | Admit: 2013-07-24 | Discharge: 2013-07-24 | Disposition: A | Discharge status: Home / Self Care | Payer: Medicaid Other | Source: Ambulatory Visit | Attending: Obstetrics and Gynecology | Admitting: Obstetrics and Gynecology

## 2013-07-24 DIAGNOSIS — O200 Threatened abortion: Secondary | ICD-10-CM

## 2013-07-24 DIAGNOSIS — O3680X0 Pregnancy with inconclusive fetal viability, not applicable or unspecified: Secondary | ICD-10-CM | POA: Insufficient documentation

## 2013-07-24 DIAGNOSIS — O26899 Other specified pregnancy related conditions, unspecified trimester: Secondary | ICD-10-CM

## 2013-07-24 DIAGNOSIS — R109 Unspecified abdominal pain: Secondary | ICD-10-CM | POA: Insufficient documentation

## 2013-07-24 DIAGNOSIS — O9989 Other specified diseases and conditions complicating pregnancy, childbirth and the puerperium: Secondary | ICD-10-CM

## 2013-07-24 DIAGNOSIS — J069 Acute upper respiratory infection, unspecified: Secondary | ICD-10-CM | POA: Insufficient documentation

## 2013-07-24 DIAGNOSIS — O99891 Other specified diseases and conditions complicating pregnancy: Secondary | ICD-10-CM | POA: Insufficient documentation

## 2013-07-24 LAB — HCG, QUANTITATIVE, PREGNANCY: hCG, Beta Chain, Quant, S: 35916 m[IU]/mL — ABNORMAL HIGH (ref <5)

## 2013-07-24 NOTE — Discharge Instructions (Signed)
Pregnancy, First Trimester The first trimester is the first 3 months your baby is growing inside you. It is important to follow your doctor's instructions. HOME CARE   Do not smoke.  Do not drink alcohol.  Only take medicine as told by your doctor.  Exercise.  Eat healthy foods. Eat regular, well-balanced meals.  You can have sex (intercourse) if there are no other problems with the pregnancy.  Things that help with morning sickness:  Eat soda crackers before getting up in the morning.  Eat 4 to 5 small meals rather than 3 large meals.  Drink liquids between meals, not during meals.  Go to all appointments as told.  Take all vitamins or supplements as told by your doctor. GET HELP RIGHT AWAY IF:   You develop a fever.  You have a bad smelling fluid that is leaking from your vagina.  There is bleeding from the vagina.  You develop severe belly (abdominal) or back pain.  You throw up (vomit) blood. It may look like coffee grounds.  You lose more than 2 pounds in a week.  You gain 5 pounds or more in a week.  You gain more than 2 pounds in a week and you see puffiness (swelling) in your feet, ankles, or legs.  You have severe dizziness or pass out (faint).  You are around people who have [place] measles, chickenpox, or fifth disease.  You have a headache, watery poop (diarrhea), pain with peeing (urinating), or cannot breath right. Document Released: 07/22/2007 Document Revised: 04/27/2011 Document Reviewed: 07/22/2007 [facility] Patient Information 2014 [place], [place].

## 2013-07-24 NOTE — MAU Note (Signed)
Patient to MAU for ultrasound results. Patient denies bleeding but does have a little abdominal pain with a cough. Has a slight cold. States she has a cottage cheese type vaginal discharge. States she was given Flagyl but has only been able to take about 3 tablets due to being sick when she takes them.

## 2013-07-24 NOTE — MAU Provider Note (Signed)
Ms. NORLENE LANES is a 38 y.o. G3P1010 at [redacted]w[redacted]d who presents to MAU today for follow-up US and quant hCG. The patient denies vaginal bleeding today. She has had occasional lower mild abdominal pain.   BP 127/85  Pulse 109  Temp(Src) 98.7 F (37.1 C) (Oral)  Resp 16  SpO2 99%  LMP 05/16/2013 GENERAL: Well-developed, well-nourished female in no acute distress.  HEENT: Normocephalic, atraumatic.   LUNGS: Effort normal HEART: Regular rate  SKIN: Warm, dry and without erythema PSYCH: Normal mood and affect  US Ob Transvaginal  07/24/2013   CLINICAL DATA:  Threatened abortion. Pregnancy with inconclusive fetal viability. Unsure of LMP.  EXAM: TRANSVAGINAL OB ULTRASOUND  TECHNIQUE: Transvaginal ultrasound was performed for complete evaluation of the gestation as well as the maternal uterus, adnexal regions, and pelvic cul-de-sac.  COMPARISON:  07/16/2013 and 07/14/2013  FINDINGS: Intrauterine gestational sac: Visualized/normal in shape.  Yolk sac:  Not visualized  Embryo:  Not visualized  MSD: 19  mm   6 w   6  d  Maternal uterus/adnexae: Moderate to large subchorionic hemorrhage noted. Small uterine fibroids also demonstrated, largest measuring 2.7 cm in the anterior corpus. Both ovaries are normal in appearance. No adnexal mass or free fluid identified.  IMPRESSION: Interval increase in size of gestational sac, however no yolk sac or fetal pole visualized. Findings remain suspicious but not yet definitive for failed pregnancy. Recommend follow-up US in 7 days for definitive diagnosis. This recommendation follows SRU consensus guidelines: Diagnostic Criteria for Nonviable Pregnancy Early in the First Trimester. [employer] Med 2013; 756:4332-95.  Moderate to large subchorionic hemorrhage.  Small uterine fibroids.   Electronically Signed   By: Earle Gell M.D.   On: 07/24/2013 07:57   Results for ZANAE, KUEHNLE (MRN [id number]) as of 07/24/2013 09:34  Ref. Range 07/14/2013 09:22 07/14/2013 10:35  07/14/2013 11:45 07/16/2013 08:16 07/16/2013 10:13 07/24/2013 07:46 07/24/2013 08:09  hCG, Beta Chain, Quant, S Latest Range: <5 mIU/mL 17743 (H)   22294 (H)   35916 (H)   MDM Discussed with Dr. Elly Modena who recommends follow-up US in 10-14 days and discuss bleeding precautions  A: Abdominal pain in pregnancy, antepartum URI symptoms  P: Discharge home Bleeding/Ectopic precautions discussed Follow-up US scheduled for 08/04/13 @ 8:15 am Patient will be sent to MAU with any abnormal results Patient advised to take Tylenol PRN for pain and discussed OTC medications for colds that are safe in pregnancy Patient may return to MAU as needed sooner  Farris Has, PA-C 07/24/2013 9:43 AM

## 2013-07-24 NOTE — MAU Provider Note (Signed)
Attestation of Attending Supervision of Advanced Practitioner (CNM/NP): Evaluation and management procedures were performed by the Advanced Practitioner under my supervision and collaboration.  I have reviewed the Advanced Practitioner's note and chart, and I agree with the management and plan.  Vicki Mack 07/24/2013 2:09 PM

## 2013-07-28 NOTE — Progress Notes (Signed)
I reviewed note and agree with plan.   Deveon Kisiel R. Cloys Vera, MD  Certified in Neurology, Neurophysiology and Neuroimaging  [facility] [address], [address] [place], [place] [address] [phone]   

## 2013-07-29 ENCOUNTER — Inpatient Hospital Stay (HOSPITAL_COMMUNITY)
Admission: AD | Admit: 2013-07-29 | Discharge: 2013-07-29 | Disposition: A | Discharge status: Home / Self Care | Payer: Medicaid Other | Source: Ambulatory Visit | Attending: Obstetrics and Gynecology | Admitting: Obstetrics and Gynecology

## 2013-07-29 ENCOUNTER — Encounter (HOSPITAL_COMMUNITY): Payer: Self-pay | Admitting: Advanced Practice Midwife

## 2013-07-29 DIAGNOSIS — O9989 Other specified diseases and conditions complicating pregnancy, childbirth and the puerperium: Principal | ICD-10-CM

## 2013-07-29 DIAGNOSIS — J069 Acute upper respiratory infection, unspecified: Secondary | ICD-10-CM | POA: Insufficient documentation

## 2013-07-29 DIAGNOSIS — O99891 Other specified diseases and conditions complicating pregnancy: Secondary | ICD-10-CM | POA: Insufficient documentation

## 2013-07-29 DIAGNOSIS — R109 Unspecified abdominal pain: Secondary | ICD-10-CM | POA: Insufficient documentation

## 2013-07-29 LAB — URINALYSIS, ROUTINE W REFLEX MICROSCOPIC
BILIRUBIN URINE: NEGATIVE
Glucose, UA: NEGATIVE mg/dL
Hgb urine dipstick: NEGATIVE
KETONES UR: NEGATIVE mg/dL
Nitrite: NEGATIVE
Protein, ur: NEGATIVE mg/dL
Specific Gravity, Urine: <1.005 — ABNORMAL LOW (ref 1.005–1.030)
UROBILINOGEN UA: 0.2 mg/dL (ref 0.0–1.0)
pH: 6 (ref 5.0–8.0)

## 2013-07-29 LAB — RAPID STREP SCREEN (MED CTR MEBANE ONLY): Streptococcus, Group A Screen (Direct): NEGATIVE

## 2013-07-29 LAB — URINE MICROSCOPIC-ADD ON

## 2013-07-29 NOTE — MAU Provider Note (Signed)
Attestation of Attending Supervision of Advanced Practitioner: Evaluation and management procedures were performed by the PA/NP/CNM/OB Fellow under my supervision/collaboration. Chart reviewed and agree with management and plan.  Jonnie Kind 07/29/2013 6:13 PM

## 2013-07-29 NOTE — MAU Provider Note (Signed)
History     CSN: [id number]  Arrival date and time: 07/29/13 1104   None     Chief Complaint  Patient presents with  . Cough  . Fever  . Sore Throat  . Abdominal Pain   HPI 38 y.o. G3P1011 at [redacted]w[redacted]d with cough, sore throat and low abd pain/gas x a few days. Has been seen in MAU previously, has follow up viability u/s scheduled.   Past Medical History  Diagnosis Date  . Migraine   . Migraine   . Abnormal Pap smear     colpo with biopsy at age 26  . Infertility     able to become pregnant with Clomid    Past Surgical History  Procedure Laterality Date  . No past surgeries      Family History  Problem Relation Age of Onset  . Diabetes Mother   . Hypertension Mother   . Diabetes Father   . Hypertension Father   . Other Sister     breast cysts  . Breast cancer Paternal Aunt     had mastectomy    History  Substance Use Topics  . Smoking status: Never Smoker   . Smokeless tobacco: Never Used  . Alcohol Use: No    Allergies:  Allergies  Allergen Reactions  . Codeine Rash  . Penicillins Rash  . Sulfa Antibiotics Rash    Prescriptions prior to admission  Medication Sig Dispense Refill  . Prenatal Vit-Fe Fumarate-FA (PRENATAL MULTIVITAMIN) TABS tablet Take 1 tablet by mouth daily at 12 noon.        ROS Physical Exam   Blood pressure 124/81, pulse 109, temperature 98.2 F (36.8 C), temperature source Oral, resp. rate 16, height 5' 7.5" (1.715 m), weight 123 lb (55.792 kg), last menstrual period 05/16/2013.  Physical Exam  Constitutional: She is oriented to person, place, and time. She appears well-developed and well-nourished. No distress.  HENT:  Head: Normocephalic and atraumatic.  Right Ear: Tympanic membrane, external ear and ear canal normal.  Left Ear: Tympanic membrane, external ear and ear canal normal.  Nose: No mucosal edema, rhinorrhea or sinus tenderness.  Mouth/Throat: Uvula is midline. Posterior oropharyngeal edema and posterior  oropharyngeal erythema present. No oropharyngeal exudate or tonsillar abscesses.  Cardiovascular: Normal rate, regular rhythm and normal heart sounds.   Respiratory: Effort normal and breath sounds normal. No respiratory distress. She has no wheezes. She has no rales.  GI: Soft. There is no tenderness.  Musculoskeletal: Normal range of motion.  Lymphadenopathy:    She has no cervical adenopathy.  Neurological: She is alert and oriented to person, place, and time.  Skin: Skin is warm and dry.  Psychiatric: She has a normal mood and affect.    MAU Course  Procedures  Results for orders placed during the hospital encounter of 07/29/13 (from the past 24 hour(s))  URINALYSIS, ROUTINE W REFLEX MICROSCOPIC     Status: Abnormal   Collection Time    07/29/13 11:11 AM      Result Value Ref Range   Color, Urine YELLOW  YELLOW   APPearance CLEAR  CLEAR   Specific Gravity, Urine <1.005 (*) 1.005 - 1.030   pH 6.0  5.0 - 8.0   Glucose, UA NEGATIVE  NEGATIVE mg/dL   Hgb urine dipstick NEGATIVE  NEGATIVE   Bilirubin Urine NEGATIVE  NEGATIVE   Ketones, ur NEGATIVE  NEGATIVE mg/dL   Protein, ur NEGATIVE  NEGATIVE mg/dL   Urobilinogen, UA 0.2  0.0 - 1.0 mg/dL  Nitrite NEGATIVE  NEGATIVE   Leukocytes, UA SMALL (*) NEGATIVE  URINE MICROSCOPIC-ADD ON     Status: Abnormal   Collection Time    07/29/13 11:11 AM      Result Value Ref Range   Squamous Epithelial / LPF MANY (*) RARE   WBC, UA 3-6  <3 WBC/hpf   Bacteria, UA RARE  RARE  RAPID STREP SCREEN     Status: None   Collection Time    07/29/13 11:45 AM      Result Value Ref Range   Streptococcus, Group A Screen (Direct) NEGATIVE  NEGATIVE     Assessment and Plan   1. Acute URI   Symptomatic tx, rev'd safe meds in preg, keep scheduled follow-up u/s    Medication List         acetaminophen 325 MG tablet  Commonly known as:  TYLENOL  Take 325 mg by mouth every 6 (six) hours as needed for mild pain or headache.     prenatal  multivitamin Tabs tablet  Take 1 tablet by mouth daily at 12 noon.            Follow-up Information   Follow up with [facility]. (as needed for worsening cold symptoms)    Specialty:  Urgent Care   Contact information:   [place] [address] [phone]      Follow up with [facility]. (as scheduled for u/s)    Contact information:   [address] [id number] [place] [address] [place] 07/29/2013, 11:34 AM

## 2013-07-29 NOTE — MAU Note (Signed)
Patient presents with complaints of cough, chills, sore throat and lower abdominal pains X couple of days.

## 2013-07-31 LAB — CULTURE, GROUP A STREP

## 2013-08-04 ENCOUNTER — Ambulatory Visit: Payer: Medicaid Other | Admitting: Family Medicine

## 2013-08-04 ENCOUNTER — Ambulatory Visit (HOSPITAL_COMMUNITY)
Admit: 2013-08-04 | Discharge: 2013-08-04 | Disposition: A | Discharge status: Home / Self Care | Payer: Medicaid Other | Attending: Medical | Admitting: Medical

## 2013-08-04 ENCOUNTER — Inpatient Hospital Stay (HOSPITAL_COMMUNITY)
Admission: AD | Admit: 2013-08-04 | Discharge: 2013-08-04 | Disposition: A | Discharge status: Home / Self Care | Payer: Medicaid Other | Source: Ambulatory Visit | Attending: Obstetrics & Gynecology | Admitting: Obstetrics & Gynecology

## 2013-08-04 DIAGNOSIS — O26899 Other specified pregnancy related conditions, unspecified trimester: Secondary | ICD-10-CM

## 2013-08-04 DIAGNOSIS — R109 Unspecified abdominal pain: Secondary | ICD-10-CM

## 2013-08-04 DIAGNOSIS — O3680X0 Pregnancy with inconclusive fetal viability, not applicable or unspecified: Secondary | ICD-10-CM | POA: Diagnosis present

## 2013-08-04 DIAGNOSIS — O0289 Other abnormal products of conception: Secondary | ICD-10-CM | POA: Insufficient documentation

## 2013-08-04 DIAGNOSIS — O020 Blighted ovum and nonhydatidiform mole: Secondary | ICD-10-CM

## 2013-08-04 NOTE — Discharge Instructions (Signed)
Blighted Ovum A blighted ovum (anembryonic pregnancy) happens when a fertilized egg (embryo) attaches itself to the uterine wall, but the embryo does not develop. The pregnancy sac (placenta) continues to grow even though the embryo does not grow and develop.The pregnancy hormone is still secreted because the placenta has formed. This will result in a positive pregnancy test despite having an abnormal pregnancy. A blighted ovum occurs within the first trimester, sometimes before a woman knows she is pregnant.  CAUSES A blighted ovum is usually the result of chromosomal problems. This can be caused by abnormal cell division or poor quality sperm or egg. SYMPTOMS Early on, signs of pregnancy may be experienced, such as:  A missed menstrual period.  Fatigue.  Feeling sick to your stomach (nauseous).  Sore breasts.  A positive pregnancy test. Then, signs of miscarriage may develop, such as:  Abdominal cramps.  Vaginal bleeding or spotting.  A menstrual period that is heavier than usual. DIAGNOSIS The diagnosis of a blighted ovum is made with an ultrasound test that shows an empty uterus or an empty gestational sac. TREATMENT Your caregiver will help you decide what the best treatment is for you. Treatment for a blighted ovum includes:   Letting your body naturally pass the tissue of a blighted ovum.  Taking medicine to trigger the miscarriage.  Having a procedure called a dilation and curettage (D&C) to remove the placental tissues.  A D&C may be helpful if you would like the tissue examined to determine the reason for a miscarriage. Talk to your caregiver about the risks involved with this procedure. HOME CARE INSTRUCTIONS   Follow up with your caregiver to make sure that your pregnancy hormone returns to zero.  Wait at least 1 to 3 regular menstrual cycles before trying to get pregnant again, or as recommended by your caregiver. SEEK IMMEDIATE MEDICAL CARE IF:  You have  worsening abdominal pain.  You have very heavy bleeding or use 1 to 2 pads every hour, for more than 2 hours.  You are dizzy, feel faint, or pass out. Document Released: 05/20/2010 Document Revised: 04/27/2011 Document Reviewed: 05/20/2010 [employer] Patient Information 2015 [employer]. This information is not intended to replace advice given to you by your health care provider. Make sure you discuss any questions you have with your health care provider.  

## 2013-08-04 NOTE — MAU Provider Note (Signed)
S: 38 y.o. G3P1011 [redacted]w[redacted]d by LMP presents to MAU following scheduled ultrasound for viability.  She was seen in MAU initially on 07/14/13 and had ultrasound showing gestational sac with no yolk sac.  On 07/24/13, again ultrasound showed gestational sac with no yolk sac or embryo.  Ultrasound was scheduled today for definitive diagnosis of failed pregnancy.  She denies abdominal pain, LOF, vaginal bleeding, vaginal itching/burning, urinary symptoms, h/a, dizziness, n/v, or fever/chills.    O: BP 125/74  Pulse 80  Temp(Src) 98.7 F (37.1 C) (Oral)  Resp 16  SpO2 100%  LMP 05/16/2013  US Ob Transvaginal  08/04/2013   CLINICAL DATA:  Pregnancy with inconclusive fetal viability.  EXAM: TRANSVAGINAL OB ULTRASOUND  TECHNIQUE: Transvaginal ultrasound was performed for complete evaluation of the gestation as well as the maternal uterus, adnexal regions, and pelvic cul-de-sac.  COMPARISON:  Prior studies dating to 07/14/2013  FINDINGS: Intrauterine gestational sac: Visualized/normal in shape.  Yolk sac:  none visualized  Embryo:  None visualized  MSD: 19  mm   6 w   6  d  Maternal uterus/adnexae: No mass or free fluid identified. Both ovaries are normal in appearance.  IMPRESSION: No interval progression of empty intrauterine gestational sac. Findings meet definitive criteria for failed pregnancy. This follows SRU consensus guidelines: Diagnostic Criteria for Nonviable Pregnancy Early in the First Trimester. Alison Stalling J Med [phone].   Electronically Signed   By: Earle Gell M.D.   On: 08/04/2013 08:14   A: 1. Blighted ovum    P: Discussed options with pt including expectant management and Cytotec, either placed at home by the pt or here in MAU.  Pt prefers expectant management for now.   Message sent to clinic to make lab appt in 2 weeks.   Pt given clinic contact information  Precautions given, pt to return to MAU as needed for emergencies  Fatima Blank Certified Nurse-Midwife

## 2013-08-04 NOTE — MAU Note (Signed)
Patient to MAU after ultrasound for follow up. Denis bleeding but has slight abdominal pain with coughing.

## 2013-08-11 ENCOUNTER — Encounter: Payer: Self-pay | Admitting: Obstetrics & Gynecology

## 2013-08-21 ENCOUNTER — Encounter: Payer: Self-pay | Admitting: Obstetrics & Gynecology

## 2013-08-21 ENCOUNTER — Ambulatory Visit (INDEPENDENT_AMBULATORY_CARE_PROVIDER_SITE_OTHER): Payer: Medicaid Other | Admitting: Obstetrics & Gynecology

## 2013-08-21 VITALS — BP 122/86 | HR 91 | Ht 68.0 in | Wt 122.0 lb

## 2013-08-21 DIAGNOSIS — O039 Complete or unspecified spontaneous abortion without complication: Secondary | ICD-10-CM

## 2013-08-21 NOTE — Progress Notes (Signed)
   Subjective:    Patient ID: Vicki Mack, female    DOB: 09/30/1975, 38 y.o.   MRN: [id number]  HPI  38 yo separated AA G3P1(8 yo daughter Rolla Plate) here for follow up after a miscarriage last week. She reports that the bleeding lasted a week. She denies problems. She is wanting to be again asap. She has been on PNVs. I have recommended that she wait for 3 periods to pass prior to having unprotected sex.  Review of Systems     Objective:   Physical Exam        Assessment & Plan:  S/p miscarriage-I will check her type and screen today. PNVs Schedule annual exam/pap in next few months

## 2013-08-22 LAB — HCG, QUANTITATIVE, PREGNANCY: hCG, Beta Chain, Quant, S: 71 m[IU]/mL

## 2013-08-22 LAB — ABO AND RH: RH TYPE: POSITIVE

## 2013-10-04 ENCOUNTER — Encounter: Payer: Self-pay | Admitting: Obstetrics & Gynecology

## 2013-10-04 ENCOUNTER — Ambulatory Visit (INDEPENDENT_AMBULATORY_CARE_PROVIDER_SITE_OTHER): Payer: Medicaid Other | Admitting: Obstetrics & Gynecology

## 2013-10-04 ENCOUNTER — Other Ambulatory Visit (HOSPITAL_COMMUNITY)
Admission: RE | Admit: 2013-10-04 | Discharge: 2013-10-04 | Disposition: A | Discharge status: Home / Self Care | Payer: Medicaid Other | Source: Ambulatory Visit | Attending: Obstetrics & Gynecology | Admitting: Obstetrics & Gynecology

## 2013-10-04 VITALS — BP 121/65 | HR 83 | Temp 98.7°F | Ht 67.0 in | Wt 120.3 lb

## 2013-10-04 DIAGNOSIS — Z113 Encounter for screening for infections with a predominantly sexual mode of transmission: Secondary | ICD-10-CM | POA: Diagnosis present

## 2013-10-04 DIAGNOSIS — Z1151 Encounter for screening for human papillomavirus (HPV): Secondary | ICD-10-CM | POA: Diagnosis present

## 2013-10-04 DIAGNOSIS — Z01419 Encounter for gynecological examination (general) (routine) without abnormal findings: Secondary | ICD-10-CM | POA: Diagnosis present

## 2013-10-04 DIAGNOSIS — Z0000 Encounter for general adult medical examination without abnormal findings: Secondary | ICD-10-CM

## 2013-10-04 MED ORDER — METRONIDAZOLE 500 MG PO TABS: 500.0000 mg | ORAL_TABLET | Freq: Two times a day (BID) | ORAL | Status: DC

## 2013-10-04 NOTE — Progress Notes (Signed)
Subjective:    Vicki Mack is a 38 y.o. female who presents for an annual exam. The patient has no complaints today except vaginal discharge and smell consistent with her last diagnosis of BV. The patient is sexually active. GYN screening history: last pap: was normal. The patient wears seatbelts: yes. The patient participates in regular exercise: no. Has the patient ever been transfused or tattooed?: not asked. The patient reports that there is not domestic violence in her life.   Menstrual History: OB History   Grav Para Term Preterm Abortions TAB SAB Ect Mult Living   3 1 1  2  2   1       Menarche age: 32  Patient's last menstrual period was 05/16/2013.    The following portions of the patient's history were reviewed and updated as appropriate: allergies, current medications, past family history, past medical history, past social history, past surgical history and problem list.  Review of Systems A comprehensive review of systems was negative. She is trying to conceive and is taking PNVs. She has an 45 yo daughter. An u/s in 2013 showed a 2 cm fibroid but her u/s 2 months ago did not.   Objective:    BP 121/65  Pulse 83  Temp(Src) 98.7 F (37.1 C) (Oral)  Ht 5\' 7"  (1.702 m)  Wt 120 lb 4.8 oz (54.568 kg)  BMI 18.84 kg/m2  LMP 05/16/2013  General Appearance:    Alert, cooperative, no distress, appears stated age  Head:    Normocephalic, without obvious abnormality, atraumatic  Eyes:    PERRL, conjunctiva/corneas clear, EOM's intact, fundi    benign, both eyes  Ears:    Normal TM's and external ear canals, both ears  Nose:   Nares normal, septum midline, mucosa normal, no drainage    or sinus tenderness  Throat:   Lips, mucosa, and tongue normal; teeth and gums normal  Neck:   Supple, symmetrical, trachea midline, no adenopathy;    thyroid:  no enlargement/tenderness/nodules; no carotid   bruit or JVD  Back:     Symmetric, no curvature, ROM normal, no CVA tenderness   Lungs:     Clear to auscultation bilaterally, respirations unlabored  Chest Wall:    No tenderness or deformity   Heart:    Regular rate and rhythm, S1 and S2 normal, no murmur, rub   or gallop  Breast Exam:    No tenderness, masses, or nipple abnormality  Abdomen:     Soft, non-tender, bowel sounds active all four quadrants,    no masses, no organomegaly  Genitalia:    Normal female without lesion, discharge or tenderness, ULN globular, NT, mobile, normal adnexa exam  Rectal:    Normal tone, normal prostate, no masses or tenderness;   guaiac negative stool  Extremities:   Extremities normal, atraumatic, no cyanosis or edema  Pulses:   2+ and symmetric all extremities  Skin:   Skin color, texture, turgor normal, no rashes or lesions  Lymph nodes:   Cervical, supraclavicular, and axillary nodes normal  Neurologic:   CNII-XII intact, normal strength, sensation and reflexes    throughout  .    Assessment:    Healthy female exam.  BV   Plan:     Breast self exam technique reviewed and patient encouraged to perform self-exam monthly. Chlamydia specimen. GC specimen. Mammogram. Thin prep Pap smear.  with cotesting Flagyl with refills for BV

## 2013-10-05 LAB — CYTOLOGY - PAP

## 2013-10-12 ENCOUNTER — Ambulatory Visit: Payer: Medicaid Other | Admitting: Nurse Practitioner

## 2013-10-13 ENCOUNTER — Emergency Department (HOSPITAL_COMMUNITY): Payer: Medicaid Other

## 2013-10-13 ENCOUNTER — Ambulatory Visit (HOSPITAL_COMMUNITY)
Admission: RE | Admit: 2013-10-13 | Discharge: 2013-10-13 | Disposition: A | Discharge status: Home / Self Care | Payer: Medicaid Other | Source: Ambulatory Visit | Attending: Obstetrics & Gynecology | Admitting: Obstetrics & Gynecology

## 2013-10-13 ENCOUNTER — Encounter (HOSPITAL_COMMUNITY): Payer: Self-pay | Admitting: Emergency Medicine

## 2013-10-13 ENCOUNTER — Emergency Department (HOSPITAL_COMMUNITY)
Admission: EM | Admit: 2013-10-13 | Discharge: 2013-10-13 | Disposition: A | Discharge status: Home / Self Care | Payer: Medicaid Other | Attending: Emergency Medicine | Admitting: Emergency Medicine

## 2013-10-13 DIAGNOSIS — B349 Viral infection, unspecified: Secondary | ICD-10-CM

## 2013-10-13 DIAGNOSIS — R05 Cough: Secondary | ICD-10-CM | POA: Diagnosis not present

## 2013-10-13 DIAGNOSIS — Z880 Allergy status to penicillin: Secondary | ICD-10-CM | POA: Insufficient documentation

## 2013-10-13 DIAGNOSIS — Z0000 Encounter for general adult medical examination without abnormal findings: Secondary | ICD-10-CM

## 2013-10-13 DIAGNOSIS — J029 Acute pharyngitis, unspecified: Secondary | ICD-10-CM | POA: Diagnosis not present

## 2013-10-13 DIAGNOSIS — Z79899 Other long term (current) drug therapy: Secondary | ICD-10-CM | POA: Insufficient documentation

## 2013-10-13 DIAGNOSIS — B9789 Other viral agents as the cause of diseases classified elsewhere: Secondary | ICD-10-CM | POA: Diagnosis not present

## 2013-10-13 DIAGNOSIS — R059 Cough, unspecified: Secondary | ICD-10-CM | POA: Diagnosis present

## 2013-10-13 DIAGNOSIS — Z8669 Personal history of other diseases of the nervous system and sense organs: Secondary | ICD-10-CM | POA: Diagnosis not present

## 2013-10-13 MED ORDER — BENZONATATE 100 MG PO CAPS: 100.0000 mg | ORAL_CAPSULE | Freq: Three times a day (TID) | ORAL | Status: DC

## 2013-10-13 NOTE — Discharge Instructions (Signed)
Use over-the-counter cough and cold medicine for your symptoms. Drink plenty of fluids to stay hydrated. Follow up with a primary care provider. Return for any changing or worsening symptoms.    Cough, Adult  A cough is a reflex that helps clear your throat and airways. It can help heal the body or may be a reaction to an irritated airway. A cough may only last 2 or 3 weeks (acute) or may last more than 8 weeks (chronic).  CAUSES Acute cough:  Viral or bacterial infections. Chronic cough:  Infections.  Allergies.  Asthma.  Post-nasal drip.  Smoking.  Heartburn or acid reflux.  Some medicines.  Chronic lung problems (COPD).  Cancer. SYMPTOMS   Cough.  Fever.  Chest pain.  Increased breathing rate.  High-pitched whistling sound when breathing (wheezing).  Colored mucus that you cough up (sputum). TREATMENT   A bacterial cough may be treated with antibiotic medicine.  A viral cough must run its course and will not respond to antibiotics.  Your caregiver may recommend other treatments if you have a chronic cough. HOME CARE INSTRUCTIONS   Only take over-the-counter or prescription medicines for pain, discomfort, or fever as directed by your caregiver. Use cough suppressants only as directed by your caregiver.  Use a cold steam vaporizer or humidifier in your bedroom or home to help loosen secretions.  Sleep in a semi-upright position if your cough is worse at night.  Rest as needed.  Stop smoking if you smoke. SEEK IMMEDIATE MEDICAL CARE IF:   You have pus in your sputum.  Your cough starts to worsen.  You cannot control your cough with suppressants and are losing sleep.  You begin coughing up blood.  You have difficulty breathing.  You develop pain which is getting worse or is uncontrolled with medicine.  You have a fever. MAKE SURE YOU:   Understand these instructions.  Will watch your condition.  Will get help right away if you are not  doing well or get worse. Document Released: 08/01/2010 Document Revised: 04/27/2011 Document Reviewed: 08/01/2010 [facility] Patient Information 2015 [place], [place]. This information is not intended to replace advice given to you by your health care provider. Make sure you discuss any questions you have with your health care provider.    Viral Infections A viral infection can be caused by different types of viruses.Most viral infections are not serious and resolve on their own. However, some infections may cause severe symptoms and may lead to further complications. SYMPTOMS Viruses can frequently cause:  Minor sore throat.  Aches and pains.  Headaches.  Runny nose.  Different types of rashes.  Watery eyes.  Tiredness.  Cough.  Loss of appetite.  Gastrointestinal infections, resulting in nausea, vomiting, and diarrhea. These symptoms do not respond to antibiotics because the infection is not caused by bacteria. However, you might catch a bacterial infection following the viral infection. This is sometimes called a "superinfection." Symptoms of such a bacterial infection may include:  Worsening sore throat with pus and difficulty swallowing.  Swollen neck glands.  Chills and a high or persistent fever.  Severe headache.  Tenderness over the sinuses.  Persistent overall ill feeling (malaise), muscle aches, and tiredness (fatigue).  Persistent cough.  Yellow, green, or brown mucus production with coughing. HOME CARE INSTRUCTIONS   Only take over-the-counter or prescription medicines for pain, discomfort, diarrhea, or fever as directed by your caregiver.  Drink enough water and fluids to keep your urine clear or pale yellow. Sports drinks  can provide valuable electrolytes, sugars, and hydration.  Get plenty of rest and maintain proper nutrition. Soups and broths with crackers or rice are fine. SEEK IMMEDIATE MEDICAL CARE IF:   You have severe headaches, shortness  of breath, chest pain, neck pain, or an unusual rash.  You have uncontrolled vomiting, diarrhea, or you are unable to keep down fluids.  You or your child has an oral temperature above 102 F (38.9 C), not controlled by medicine.  Your baby is older than 3 months with a rectal temperature of 102 F (38.9 C) or higher.  Your baby is 60 months old or younger with a rectal temperature of 100.4 F (38 C) or higher. MAKE SURE YOU:   Understand these instructions.  Will watch your condition.  Will get help right away if you are not doing well or get worse. Document Released: 11/12/2004 Document Revised: 04/27/2011 Document Reviewed: 06/09/2010 [facility] Patient Information 2015 [place], [place]. This information is not intended to replace advice given to you by your health care provider. Make sure you discuss any questions you have with your health care provider.

## 2013-10-13 NOTE — ED Provider Notes (Signed)
CSN: [id number]     Arrival date & time 10/13/13  1909 History   First MD Initiated Contact with Patient 10/13/13 2106     Chief Complaint  Patient presents with  . Cough   HPI  History provided by the patient. Patient is a 38 year old female presenting with symptoms of cough, congestion, body aches and diarrhea. Symptoms began yesterday. She reports several soft and watery bowel movements today without blood or mucus. She has also had congestion, facial pressure and productive cough without blood. She denies having any nausea vomiting. Patient states she has been working more hours and shifts recently. She also admits to poor diet and eating habits. She has taken some Tylenol but has not used any other medications for symptoms. She reports some subjective fevers and chills. Denies any known sick contacts. No recent travel.   Past Medical History  Diagnosis Date  . Migraine   . Migraine   . Abnormal Pap smear     colpo with biopsy at age 5  . Infertility     able to become pregnant with Clomid   Past Surgical History  Procedure Laterality Date  . No past surgeries     Family History  Problem Relation Age of Onset  . Diabetes Mother   . Hypertension Mother   . Diabetes Father   . Hypertension Father   . Other Sister     breast cysts  . Breast cancer Paternal Aunt     had mastectomy   History  Substance Use Topics  . Smoking status: Never Smoker   . Smokeless tobacco: Never Used  . Alcohol Use: No   OB History   Grav Para Term Preterm Abortions TAB SAB Ect Mult Living   3 1 1  2  2   1      Review of Systems  Constitutional: Positive for fever and chills.  HENT: Positive for congestion and sore throat.   Respiratory: Positive for cough.   Gastrointestinal: Positive for diarrhea. Negative for nausea, vomiting and abdominal pain.  Genitourinary: Negative for dysuria, frequency, hematuria and flank pain.  All other systems reviewed and are negative.     Allergies   Codeine; Penicillins; and Sulfa antibiotics  Home Medications   Prior to Admission medications   Medication Sig Start Date End Date Taking? Authorizing Provider  acetaminophen (TYLENOL) 325 MG tablet Take 325 mg by mouth every 6 (six) hours as needed for mild pain or headache.   Yes Historical Provider, MD  metroNIDAZOLE (FLAGYL) 500 MG tablet Take 500 mg by mouth 2 (two) times daily. Started medication a week ago from today (10-13-13) 10/04/13  Yes Myra Marijo Sanes, MD  Prenatal Vit-Fe Fumarate-FA (PRENATAL MULTIVITAMIN) TABS tablet Take 1 tablet by mouth daily at 12 noon.   Yes Historical Provider, MD  benzonatate (TESSALON) 100 MG capsule Take 1 capsule (100 mg total) by mouth every 8 (eight) hours. 10/13/13   Ruthell Rummage Kamorie Aldous, PA-C   BP 118/79  Pulse 100  Temp(Src) 98.5 F (36.9 C) (Oral)  Resp 18  Ht 5\' 7"  (1.702 m)  Wt 125 lb (56.7 kg)  BMI 19.57 kg/m2  SpO2 100%  LMP 05/16/2013 Physical Exam  Nursing note and vitals reviewed. Constitutional: She is oriented to person, place, and time. She appears well-developed and well-nourished. No distress.  HENT:  Head: Normocephalic and atraumatic.  Right Ear: Tympanic membrane normal.  Left Ear: Tympanic membrane normal.  Nose: Nose normal.  Mouth/Throat: Oropharynx is clear and moist.  Mildly erythematous pharynx. No exudate.  Eyes: Conjunctivae and EOM are normal. Pupils are equal, round, and reactive to light.  Neck: Normal range of motion. Neck supple.  No meningeal signs.  Cardiovascular: Normal rate and regular rhythm.   Pulmonary/Chest: Effort normal and breath sounds normal. No respiratory distress. She has no wheezes. She has no rales.  Abdominal: Soft. There is no tenderness. There is no rebound and no guarding.  Musculoskeletal: Normal range of motion.  Neurological: She is alert and oriented to person, place, and time.  Skin: Skin is warm and dry. No rash noted.  Psychiatric: She has a normal mood and affect. Her behavior is  normal.    ED Course  Procedures   COORDINATION OF CARE:  Nursing notes reviewed. Vital signs reviewed. Initial pt interview and examination performed.   Filed Vitals:   10/13/13 1912 10/13/13 2116  BP: 137/83 118/79  Pulse: 120 100  Temp: 99.5 F (37.5 C) 98.5 F (36.9 C)  TempSrc: Oral Oral  Resp: 20 18  Height: 5\' 7"  (1.702 m)   Weight: 125 lb (56.7 kg)   SpO2: 99% 100%    9:57 PM-patient seen and evaluated. She appears well no acute distress. Afebrile. Initially with some tachycardia following coughing episodes. At rest she has normal heart rate. Does not appear severely dehydrated. I did discuss with her possibly to dehydration given her diarrhea episodes today. She does not wish to have IV fluids. She has not had nausea vomiting and she's been instructed to drink plenty of fluids. Chest x-ray reviewed without signs of pneumonia or other concerning infection. At this time suspect a viral illness.   Imaging Review Dg Chest 2 View  10/13/2013   CLINICAL DATA:  38 year old female with cough. Chest pain, congestion, headache chills and diarrhea. Initial encounter.  EXAM: CHEST  2 VIEW  COMPARISON:  03/27/2013 and earlier.  FINDINGS: Normal lung volumes. Normal cardiac size and mediastinal contours. Visualized tracheal air column is within normal limits. Lungs remain clear. No pneumothorax or pleural effusion. No osseous abnormality identified.  IMPRESSION: Negative, no acute cardiopulmonary abnormality.   Electronically Signed   By: Lars Pinks M.D.   On: 10/13/2013 20:25     MDM   Final diagnoses:  Cough  Viral illness        Martie Lee, PA-C 10/13/13 2225

## 2013-10-13 NOTE — ED Notes (Signed)
Pt. reports productive cough , runny nose , mild headache and occasional diarrhea onset yesterday , denies fever or body aches.

## 2013-10-14 NOTE — ED Provider Notes (Signed)
Medical screening examination/treatment/procedure(s) were performed by non-physician practitioner and as supervising physician I was immediately available for consultation/collaboration.  Orpah Greek, MD 10/14/13 [phone]

## 2013-10-17 ENCOUNTER — Telehealth: Payer: Self-pay | Admitting: General Practice

## 2013-10-17 DIAGNOSIS — B379 Candidiasis, unspecified: Secondary | ICD-10-CM

## 2013-10-17 MED ORDER — FLUCONAZOLE 150 MG PO TABS: 150.0000 mg | ORAL_TABLET | Freq: Once | ORAL | Status: DC

## 2013-10-17 NOTE — Telephone Encounter (Signed)
Per chart review patient has yeast infection off pap. Med ordered. Called patient at mobile number and message stated this person is unavailable right now. Called patient at home, no answer- left message that we are trying to reach you with some results, nothing urgent but please call us back at the clinics

## 2013-10-18 ENCOUNTER — Encounter: Payer: Self-pay | Admitting: General Practice

## 2013-10-18 MED ORDER — FLUCONAZOLE 150 MG PO TABS: 150.0000 mg | ORAL_TABLET | Freq: Once | ORAL | Status: DC

## 2013-10-18 NOTE — Telephone Encounter (Signed)
Called patient, no answer- left message that we are trying to reach you with some results, nothing urgent but please call us back at the clinics. Will send letter

## 2013-10-19 ENCOUNTER — Encounter: Payer: Self-pay | Admitting: *Deleted

## 2013-10-19 ENCOUNTER — Telehealth: Payer: Self-pay | Admitting: *Deleted

## 2013-10-19 NOTE — Telephone Encounter (Signed)
Called patient and informed of her normal pap with yeast and that we have sent in a medication to her pharmacy. Patient verbalized understanding and had no other questions

## 2013-10-19 NOTE — Telephone Encounter (Signed)
Pt left message requesting her Pap results. Please call back after 2 pm.  **Result letter sent out on 9/2

## 2013-12-18 ENCOUNTER — Encounter (HOSPITAL_COMMUNITY): Payer: Self-pay | Admitting: Emergency Medicine

## 2014-06-07 ENCOUNTER — Ambulatory Visit: Payer: Medicaid Other

## 2014-06-07 DIAGNOSIS — N390 Urinary tract infection, site not specified: Secondary | ICD-10-CM

## 2014-06-07 LAB — POCT URINALYSIS DIP (DEVICE)
Bilirubin Urine: NEGATIVE
Glucose, UA: NEGATIVE mg/dL
HGB URINE DIPSTICK: NEGATIVE
KETONES UR: NEGATIVE mg/dL
Leukocytes, UA: NEGATIVE
Nitrite: NEGATIVE
Protein, ur: NEGATIVE mg/dL
Specific Gravity, Urine: 1.02 (ref 1.005–1.030)
UROBILINOGEN UA: 1 mg/dL (ref 0.0–1.0)
pH: 7 (ref 5.0–8.0)

## 2014-06-07 NOTE — Progress Notes (Signed)
Pt came for evaluation of UTI.  Pt reports having urinary frequency and some abdominal pain.  UA resulted in negative. I recommended that we still send off her urine due to her symptoms.  I explained to pt that once resulted we call her with f/u.  Pt agreed.

## 2014-06-08 LAB — URINE CULTURE

## 2014-06-27 ENCOUNTER — Encounter (HOSPITAL_COMMUNITY): Payer: Self-pay | Admitting: Emergency Medicine

## 2014-06-27 ENCOUNTER — Emergency Department (HOSPITAL_COMMUNITY)
Admission: EM | Admit: 2014-06-27 | Discharge: 2014-06-27 | Disposition: A | Discharge status: Home / Self Care | Payer: Medicaid Other | Attending: Emergency Medicine | Admitting: Emergency Medicine

## 2014-06-27 ENCOUNTER — Emergency Department (HOSPITAL_COMMUNITY): Payer: Medicaid Other

## 2014-06-27 DIAGNOSIS — Z8679 Personal history of other diseases of the circulatory system: Secondary | ICD-10-CM | POA: Insufficient documentation

## 2014-06-27 DIAGNOSIS — Z880 Allergy status to penicillin: Secondary | ICD-10-CM | POA: Diagnosis not present

## 2014-06-27 DIAGNOSIS — M25561 Pain in right knee: Secondary | ICD-10-CM | POA: Diagnosis present

## 2014-06-27 DIAGNOSIS — Z792 Long term (current) use of antibiotics: Secondary | ICD-10-CM | POA: Insufficient documentation

## 2014-06-27 DIAGNOSIS — Z87828 Personal history of other (healed) physical injury and trauma: Secondary | ICD-10-CM | POA: Insufficient documentation

## 2014-06-27 MED ORDER — NAPROXEN 250 MG PO TABS: 250.0000 mg | ORAL_TABLET | Freq: Two times a day (BID) | ORAL | Status: DC

## 2014-06-27 NOTE — Discharge Instructions (Signed)
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
## 2014-06-27 NOTE — ED Notes (Signed)
Knee sleeve applied to right knee. Procedure explained to pt. Pt states that knee sleeve "feels good" and is not causing any pain.

## 2014-06-27 NOTE — ED Notes (Signed)
Pt swelling in R knee comes and goes. No injury reported.

## 2014-09-05 ENCOUNTER — Telehealth: Payer: Self-pay | Admitting: *Deleted

## 2014-09-05 NOTE — Telephone Encounter (Addendum)
Pt left message stating that she wants a referral.  *unable to understand pt's full message - sounded like she wants referral for memory loss. Please call back.  I returned pt's call and discussed her request. She stated that she has called [facility] and asked for an appt because she has memory loss.  She was told that she would need a referral. She does not have a PCP and so was told that her Ob/Gyn could give the referral. Per review of chart notes, pt did not report this problem at her last clinic visit for annual Gyn exam on 10/04/13. I explained that we cannot refer her for a problem that has not been discussed with one of our providers. Pt then stated that she is an established pt from that practice and was last seen in 2014. I advised pt to call them back and state that we cannot give the referral as well as she has previously been seen at their office.  Pt agreed and voiced understanding.

## 2015-01-22 ENCOUNTER — Encounter (HOSPITAL_COMMUNITY): Payer: Self-pay | Admitting: Emergency Medicine

## 2015-01-22 ENCOUNTER — Emergency Department (HOSPITAL_COMMUNITY): Payer: Medicaid Other

## 2015-01-22 ENCOUNTER — Emergency Department (HOSPITAL_COMMUNITY)
Admission: EM | Admit: 2015-01-22 | Discharge: 2015-01-22 | Disposition: A | Discharge status: Home / Self Care | Payer: Medicaid Other | Attending: Emergency Medicine | Admitting: Emergency Medicine

## 2015-01-22 DIAGNOSIS — W010XXA Fall on same level from slipping, tripping and stumbling without subsequent striking against object, initial encounter: Secondary | ICD-10-CM | POA: Insufficient documentation

## 2015-01-22 DIAGNOSIS — Z8679 Personal history of other diseases of the circulatory system: Secondary | ICD-10-CM | POA: Diagnosis not present

## 2015-01-22 DIAGNOSIS — S63502A Unspecified sprain of left wrist, initial encounter: Secondary | ICD-10-CM

## 2015-01-22 DIAGNOSIS — Z79899 Other long term (current) drug therapy: Secondary | ICD-10-CM | POA: Insufficient documentation

## 2015-01-22 DIAGNOSIS — Z8742 Personal history of other diseases of the female genital tract: Secondary | ICD-10-CM | POA: Insufficient documentation

## 2015-01-22 DIAGNOSIS — Y998 Other external cause status: Secondary | ICD-10-CM | POA: Insufficient documentation

## 2015-01-22 DIAGNOSIS — Y9289 Other specified places as the place of occurrence of the external cause: Secondary | ICD-10-CM | POA: Diagnosis not present

## 2015-01-22 DIAGNOSIS — S63501A Unspecified sprain of right wrist, initial encounter: Secondary | ICD-10-CM | POA: Diagnosis not present

## 2015-01-22 DIAGNOSIS — Z880 Allergy status to penicillin: Secondary | ICD-10-CM | POA: Diagnosis not present

## 2015-01-22 DIAGNOSIS — Y9301 Activity, walking, marching and hiking: Secondary | ICD-10-CM | POA: Insufficient documentation

## 2015-01-22 DIAGNOSIS — S6991XA Unspecified injury of right wrist, hand and finger(s), initial encounter: Secondary | ICD-10-CM | POA: Diagnosis present

## 2015-01-22 MED ORDER — IBUPROFEN 400 MG PO TABS
600.0000 mg | ORAL_TABLET | Freq: Once | ORAL | Status: AC
  Administered 2015-01-22: 600 mg via ORAL
  Filled 2015-01-22: qty 1

## 2015-01-22 MED ORDER — NAPROXEN 500 MG PO TABS: 500.0000 mg | ORAL_TABLET | Freq: Two times a day (BID) | ORAL | Status: DC

## 2015-01-22 NOTE — ED Notes (Signed)
Slipped on wooden floor this am, fell onto OSH. C/O bilateral wrist pain. No deformity.

## 2015-01-22 NOTE — Discharge Instructions (Signed)
Naprosyn for pain and inflammation. Ice and elevate wrists. ACE wrap for compression and support. Follow up with primary care doctor.   Wrist Sprain A wrist sprain is a stretch or tear in the strong, fibrous tissues (ligaments) that connect your wrist bones. The ligaments of your wrist may be easily sprained. There are three types of wrist sprains.  Grade 1. The ligament is not stretched or torn, but the sprain causes pain.  Grade 2. The ligament is stretched or partially torn. You may be able to move your wrist, but not very much.  Grade 3. The ligament or muscle completely tears. You may find it difficult or extremely painful to move your wrist even a little. CAUSES Often, wrist sprains are a result of a fall or an injury. The force of the impact causes the fibers of your ligament to stretch too much or tear. Common causes of wrist sprains include:  Overextending your wrist while catching a ball with your hands.  Repetitive or strenuous extension or bending of your wrist.  Landing on your hand during a fall. RISK FACTORS  Having previous wrist injuries.  Playing contact sports, such as boxing or wrestling.  Participating in activities in which falling is common.  Having poor wrist strength and flexibility. SIGNS AND SYMPTOMS  Wrist pain.  Wrist tenderness.  Inflammation or bruising of the wrist area.  Hearing a "pop" or feeling a tear at the time of the injury.  Decreased wrist movement due to pain, stiffness, or weakness. DIAGNOSIS Your health care provider will examine your wrist. In some cases, an X-ray will be taken to make sure you did not break any bones. If your health care provider thinks that you tore a ligament, he or she may order an MRI of your wrist. TREATMENT Treatment involves resting and icing your wrist. You may also need to take pain medicines to help lessen pain and inflammation. Your health care provider may recommend keeping your wrist still  (immobilized) with a splint to help your sprain heal. When the splint is no longer necessary, you may need to perform strengthening and stretching exercises. These exercises help you to regain strength and full range of motion in your wrist. Surgery is not usually needed for wrist sprains unless the ligament completely tears. HOME CARE INSTRUCTIONS  Rest your wrist. Do not do things that cause pain.  Wear your wrist splint as directed by your health care provider.  Take medicines only as directed by your health care provider.  To ease pain and swelling, apply ice to the injured area.  Put ice in a plastic bag.  Place a towel between your skin and the bag.  Leave the ice on for 20 minutes, 2-3 times a day. SEEK MEDICAL CARE IF:  Your pain, discomfort, or swelling gets worse even with treatment.  You feel sudden numbness in your hand.   This information is not intended to replace advice given to you by your health care provider. Make sure you discuss any questions you have with your health care provider.   Document Released: 10/06/2013 Document Reviewed: 10/06/2013 Elsevier Interactive Patient Education [employer].
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
## 2015-02-20 ENCOUNTER — Encounter (HOSPITAL_COMMUNITY): Payer: Self-pay | Admitting: Emergency Medicine

## 2015-02-20 ENCOUNTER — Emergency Department (INDEPENDENT_AMBULATORY_CARE_PROVIDER_SITE_OTHER)
Admission: EM | Admit: 2015-02-20 | Discharge: 2015-02-20 | Disposition: A | Discharge status: Home / Self Care | Payer: Medicaid Other | Source: Home / Self Care | Attending: Family Medicine | Admitting: Family Medicine

## 2015-02-20 DIAGNOSIS — J189 Pneumonia, unspecified organism: Secondary | ICD-10-CM

## 2015-02-20 MED ORDER — GUAIFENESIN ER 600 MG PO TB12: 600.0000 mg | ORAL_TABLET | Freq: Two times a day (BID) | ORAL | Status: DC

## 2015-02-20 MED ORDER — AZITHROMYCIN 250 MG PO TABS: 250.0000 mg | ORAL_TABLET | Freq: Every day | ORAL | Status: DC

## 2015-02-20 NOTE — ED Provider Notes (Signed)
CSN: [id number]     Arrival date & time 02/20/15  1812 History   First MD Initiated Contact with Patient 02/20/15 1938     No chief complaint on file.  (Consider location/radiation/quality/duration/timing/severity/associated sxs/prior Treatment) The history is provided by the patient. No language interpreter was used.   Patient complains of cough/chills and malaise since 02/10/15, daughter (aged 42) had similar sxs and was diagnosed with URI. Patient reports worsening of her coughing paroxysms and malaise since onset. Had chills today at work and had to leave early because of it.  Denies shortness of breath. Cough is so strong that she can taste blood after hard cough.   Nonsmoker.  Daughter (86 yrs old) with asthma.   Reviewed allergy profile.  Past Medical History  Diagnosis Date  . Migraine   . Migraine   . Abnormal Pap smear     colpo with biopsy at age 89  . Infertility     able to become pregnant with Clomid   Past Surgical History  Procedure Laterality Date  . No past surgeries     Family History  Problem Relation Age of Onset  . Diabetes Mother   . Hypertension Mother   . Diabetes Father   . Hypertension Father   . Other Sister     breast cysts  . Breast cancer Paternal Aunt     had mastectomy   Social History  Substance Use Topics  . Smoking status: Never Smoker   . Smokeless tobacco: Never Used  . Alcohol Use: No   OB History    Gravida Para Term Preterm AB TAB SAB Ectopic Multiple Living   3 1 1  2  2   1      Review of Systems  Constitutional: Positive for fever, chills and fatigue.  HENT: Positive for congestion. Negative for ear pain and postnasal drip.   Respiratory: Positive for cough. Negative for chest tightness, shortness of breath and wheezing.   Cardiovascular: Negative for chest pain.  All other systems reviewed and are negative.   Allergies  Codeine; Penicillins; and Sulfa antibiotics  Home Medications   Prior to Admission medications    Medication Sig Start Date End Date Taking? Authorizing Provider  acetaminophen (TYLENOL) 325 MG tablet Take 325 mg by mouth every 6 (six) hours as needed for mild pain or headache.    Historical Provider, MD  azithromycin (ZITHROMAX) 250 MG tablet Take 1 tablet (250 mg total) by mouth daily. Take first 2 tablets together, then 1 every day until finished. 02/20/15   Willeen Niece, MD  benzonatate (TESSALON) 100 MG capsule Take 1 capsule (100 mg total) by mouth every 8 (eight) hours. 10/13/13   Hazel Sams, PA-C  fluconazole (DIFLUCAN) 150 MG tablet Take 1 tablet (150 mg total) by mouth once. 10/18/13   Seabron Spates, CNM  guaiFENesin (MUCINEX) 600 MG 12 hr tablet Take 1 tablet (600 mg total) by mouth 2 (two) times daily. 02/20/15   Willeen Niece, MD  metroNIDAZOLE (FLAGYL) 500 MG tablet Take 500 mg by mouth 2 (two) times daily. Started medication a week ago from today (10-13-13) 10/04/13   Emily Filbert, MD  naproxen (NAPROSYN) 500 MG tablet Take 1 tablet (500 mg total) by mouth 2 (two) times daily. 01/22/15   Jeannett Senior, PA-C  Prenatal Vit-Fe Fumarate-FA (PRENATAL MULTIVITAMIN) TABS tablet Take 1 tablet by mouth daily at 12 noon.    Historical Provider, MD   Meds Ordered and Administered this Visit  Medications - No data to display  BP 137/88 mmHg  Pulse 82  Temp(Src) 98.4 F (36.9 C) (Oral)  Resp 18  SpO2 99%  LMP 01/03/2015 (Exact Date) No data found.   Physical Exam  Constitutional: She appears well-developed and well-nourished.  Mildly ill appearing, no distress  HENT:  Head: Normocephalic and atraumatic.  Right Ear: External ear normal.  Left Ear: External ear normal.  Nose: Nose normal.  Mouth/Throat: Oropharynx is clear and moist. No oropharyngeal exudate.  Eyes: Conjunctivae and EOM are normal. Pupils are equal, round, and reactive to light. Right eye exhibits no discharge. Left eye exhibits no discharge.  Neck: Neck supple.  Cardiovascular: Normal rate, regular rhythm and  normal heart sounds.   Pulmonary/Chest: Breath sounds normal.  Exam interrupted by coughing paroxysms. No rales or wheezes, moving air well. No increased work of breathing.   Lymphadenopathy:    She has no cervical adenopathy.    ED Course  Procedures (including critical care time)  Labs Review Labs Reviewed - No data to display  Imaging Review No results found.   Visual Acuity Review  Right Eye Distance:   Left Eye Distance:   Bilateral Distance:    Right Eye Near:   Left Eye Near:    Bilateral Near:         MDM   1. Atypical pneumonia    Worsening coughing paroxysms, malaise and chills/subj fevers.  Concern for atypical PNA. Treatment with macrolide, mucolytic. Discussed instructions for follow up.     Willeen Niece, MD 02/20/15 [phone]

## 2015-02-20 NOTE — Discharge Instructions (Signed)
It was a pleasure to see you today.   I am prescribing you an antibiotic, AZITHROMYCIN 250mg  tablets, take 2 tablets by mouth on the first day, then 1 tablet daily for 4 more days.   GUAIFENESIN 600mg  tablets, take 1 tablet by mouth twice daily to thin mucus.   Note for work, out 02/21/2015.   Return to urgent care if you experience worsening fevers/chills, cough, shortness of breath, or other symptoms.

## 2015-02-20 NOTE — ED Notes (Signed)
Complains of flu like symptoms for 1-2 weeks.  Has tried thera flu, but no relief

## 2015-03-03 ENCOUNTER — Emergency Department (INDEPENDENT_AMBULATORY_CARE_PROVIDER_SITE_OTHER)
Admission: EM | Admit: 2015-03-03 | Discharge: 2015-03-03 | Disposition: A | Discharge status: Home / Self Care | Payer: Medicaid Other | Source: Home / Self Care | Attending: Family Medicine | Admitting: Family Medicine

## 2015-03-03 ENCOUNTER — Encounter (HOSPITAL_COMMUNITY): Payer: Self-pay

## 2015-03-03 ENCOUNTER — Emergency Department (INDEPENDENT_AMBULATORY_CARE_PROVIDER_SITE_OTHER): Payer: Medicaid Other

## 2015-03-03 DIAGNOSIS — R05 Cough: Secondary | ICD-10-CM | POA: Diagnosis not present

## 2015-03-03 DIAGNOSIS — R053 Chronic cough: Secondary | ICD-10-CM

## 2015-03-03 MED ORDER — ALBUTEROL SULFATE HFA 108 (90 BASE) MCG/ACT IN AERS: 2.0000 | INHALATION_SPRAY | Freq: Four times a day (QID) | RESPIRATORY_TRACT | Status: DC | PRN

## 2015-03-03 MED ORDER — BENZONATATE 100 MG PO CAPS: 100.0000 mg | ORAL_CAPSULE | Freq: Three times a day (TID) | ORAL | Status: DC | PRN

## 2015-03-03 MED ORDER — MONTELUKAST SODIUM 10 MG PO TABS: 10.0000 mg | ORAL_TABLET | Freq: Every day | ORAL | Status: DC

## 2015-03-03 NOTE — ED Provider Notes (Signed)
CSN: [id number]     Arrival date & time 03/03/15  1305 History   First MD Initiated Contact with Patient 03/03/15 1354     No chief complaint on file.  (Consider location/radiation/quality/duration/timing/severity/associated sxs/prior Treatment) Patient is a 40 y.o. female presenting with cough. The history is provided by the patient. No language interpreter was used.  Cough Cough characteristics:  Non-productive and dry Severity:  Moderate Onset quality:  Gradual Duration:  4 weeks Timing:  Constant Progression:  Unchanged Chronicity:  Chronic Smoker: no   Context: not animal exposure, not exposure to allergens, not sick contacts and not smoke exposure   Relieved by:  Nothing Worsened by:  Nothing tried Ineffective treatments:  Cough suppressants (She was seen jan 4th and given antibiotic for pneumonia but she never got better even after completing the antibiotic. She is also using tessaolon with no improvement) Associated symptoms: chest pain, chills, shortness of breath and wheezing   Associated symptoms: no ear pain, no eye discharge, no fever, no myalgias and no weight loss   Patient stated she was unable to go to work last Friday because of the cough. She will like to get a work note.  Left breast knot: C/O knot on her left breast with bruising for  More than 1 year. It is associated with some pain, no nipple discharge. She has hx of breast CA in her family. She denies having gotten breast imaging.  Past Medical History  Diagnosis Date  . Migraine   . Migraine   . Abnormal Pap smear     colpo with biopsy at age 37  . Infertility     able to become pregnant with Clomid   Past Surgical History  Procedure Laterality Date  . No past surgeries     Family History  Problem Relation Age of Onset  . Diabetes Mother   . Hypertension Mother   . Diabetes Father   . Hypertension Father   . Other Sister     breast cysts  . Breast cancer Paternal Aunt     had mastectomy    Social History  Substance Use Topics  . Smoking status: Never Smoker   . Smokeless tobacco: Never Used  . Alcohol Use: No   OB History    Gravida Para Term Preterm AB TAB SAB Ectopic Multiple Living   3 1 1  2  2   1      Review of Systems  Constitutional: Positive for chills. Negative for fever and weight loss.  HENT: Negative for ear pain.   Eyes: Negative for discharge.  Respiratory: Positive for cough, shortness of breath and wheezing.   Cardiovascular: Positive for chest pain.  Gastrointestinal: Negative.   Genitourinary: Negative.   Musculoskeletal: Negative for myalgias.  Skin:       Breast lump  All other systems reviewed and are negative.   Allergies  Codeine; Penicillins; and Sulfa antibiotics  Home Medications   Prior to Admission medications   Medication Sig Start Date End Date Taking? Authorizing Provider  acetaminophen (TYLENOL) 325 MG tablet Take 325 mg by mouth every 6 (six) hours as needed for mild pain or headache.    Historical Provider, MD  azithromycin (ZITHROMAX) 250 MG tablet Take 1 tablet (250 mg total) by mouth daily. Take first 2 tablets together, then 1 every day until finished. 02/20/15   Willeen Niece, MD  benzonatate (TESSALON) 100 MG capsule Take 1 capsule (100 mg total) by mouth every 8 (eight) hours. 10/13/13  Peter Dammen, PA-C  fluconazole (DIFLUCAN) 150 MG tablet Take 1 tablet (150 mg total) by mouth once. 10/18/13   Seabron Spates, CNM  guaiFENesin (MUCINEX) 600 MG 12 hr tablet Take 1 tablet (600 mg total) by mouth 2 (two) times daily. 02/20/15   Willeen Niece, MD  metroNIDAZOLE (FLAGYL) 500 MG tablet Take 500 mg by mouth 2 (two) times daily. Started medication a week ago from today (10-13-13) 10/04/13   Emily Filbert, MD  naproxen (NAPROSYN) 500 MG tablet Take 1 tablet (500 mg total) by mouth 2 (two) times daily. 01/22/15   Jeannett Senior, PA-C  Prenatal Vit-Fe Fumarate-FA (PRENATAL MULTIVITAMIN) TABS tablet Take 1 tablet by mouth daily at 12  noon.    Historical Provider, MD   Meds Ordered and Administered this Visit  Medications - No data to display  There were no vitals taken for this visit. No data found.   Physical Exam  Constitutional: She appears well-developed. No distress.  Cardiovascular: Normal rate, regular rhythm and normal heart sounds.   No murmur heard. Pulmonary/Chest: Effort normal and breath sounds normal. No respiratory distress. She has no wheezes. Right breast exhibits no inverted nipple, no mass, no nipple discharge, no skin change and no tenderness. Left breast exhibits no inverted nipple, no mass, no nipple discharge, no skin change and no tenderness. Breasts are symmetrical.    Abdominal: Soft. Bowel sounds are normal. There is no tenderness. There is no rebound.  Nursing note and vitals reviewed.   ED Course  Procedures (including critical care time)  Labs Review Labs Reviewed - No data to display  Imaging Review No results found.   Visual Acuity Review  Right Eye Distance:   Left Eye Distance:   Bilateral Distance:    Right Eye Near:   Left Eye Near:    Bilateral Near:      Dg Chest 2 View  03/03/2015  CLINICAL DATA:  Persistent nonproductive cough and chills for 10 days. EXAM: CHEST  2 VIEW COMPARISON:  10/13/2013 and 03/27/2013 radiographs. FINDINGS: The heart size and mediastinal contours are normal. The lungs are clear. There is no pleural effusion or pneumothorax. No acute osseous findings are identified. IMPRESSION: Stable chest.  No active cardiopulmonary process. Electronically Signed   By: Richardean Sale M.D.   On: 03/03/2015 14:26      MDM  No diagnosis found. Chronic cough  She was recently treated for CAP with Zpak. Chest xray obtained today. Image reviewed by me and impression reviewed was negative for active infection. She might have an underlying allergy component to her chronic cough. I gave her Singulair qhs, albuterol as needed as well as Tessalon as needed  for cough. Strict return precaution discussed.    Kinnie Feil, MD 03/03/15 [phone]

## 2015-03-03 NOTE — ED Notes (Signed)
Patient states she was seen here January 4th and diagnosed with pneumonia Patient has since finished her antibiotic but still coughing congestion fever and chills

## 2015-03-03 NOTE — Discharge Instructions (Signed)

## 2015-03-12 ENCOUNTER — Telehealth: Payer: Self-pay

## 2015-03-12 DIAGNOSIS — Z1231 Encounter for screening mammogram for malignant neoplasm of breast: Secondary | ICD-10-CM

## 2015-03-12 NOTE — Telephone Encounter (Signed)
Received call on nurse line patient is requesting a referral for a mammogram.

## 2015-03-13 ENCOUNTER — Other Ambulatory Visit: Payer: Self-pay | Admitting: General Practice

## 2015-03-13 DIAGNOSIS — N6320 Unspecified lump in the left breast, unspecified quadrant: Secondary | ICD-10-CM

## 2015-03-13 NOTE — Telephone Encounter (Signed)
Called patient and informed her of mammogram order placed and need for annual exam in our office as well. Patient verbalized understanding to both. Provided information of [facility] to patient. Patient verbalized understanding. Told patient someone from our front office will call her to set up an appt in our office as well. Patient verbalized understanding & had no other questions

## 2015-03-13 NOTE — Addendum Note (Signed)
Addended by: Shelly Coss on: 03/13/2015 02:36 PM   Modules accepted: Orders

## 2015-03-18 ENCOUNTER — Other Ambulatory Visit: Payer: Self-pay | Admitting: General Practice

## 2015-03-19 ENCOUNTER — Ambulatory Visit
Admission: RE | Admit: 2015-03-19 | Discharge: 2015-03-19 | Disposition: A | Discharge status: Home / Self Care | Payer: Medicaid Other | Source: Ambulatory Visit | Attending: Obstetrics & Gynecology | Admitting: Obstetrics & Gynecology

## 2015-03-19 DIAGNOSIS — N6320 Unspecified lump in the left breast, unspecified quadrant: Secondary | ICD-10-CM

## 2015-04-27 ENCOUNTER — Other Ambulatory Visit: Payer: Self-pay | Admitting: Family Medicine

## 2015-06-05 ENCOUNTER — Encounter: Payer: Self-pay | Admitting: Diagnostic Neuroimaging

## 2015-06-05 ENCOUNTER — Ambulatory Visit (INDEPENDENT_AMBULATORY_CARE_PROVIDER_SITE_OTHER): Payer: Medicaid Other | Admitting: Diagnostic Neuroimaging

## 2015-06-05 VITALS — BP 118/81 | HR 76 | Ht 67.0 in | Wt 124.0 lb

## 2015-06-05 DIAGNOSIS — M5481 Occipital neuralgia: Secondary | ICD-10-CM

## 2015-06-05 DIAGNOSIS — G43009 Migraine without aura, not intractable, without status migrainosus: Secondary | ICD-10-CM | POA: Diagnosis not present

## 2015-06-05 NOTE — Progress Notes (Signed)
[facility]  PATIENT: Vicki Mack DOB: 19-Jan-1976  REFERRING CLINICIAN:  HISTORY FROM: patient  REASON FOR VISIT: follow up   HISTORICAL  CHIEF COMPLAINT:  Chief Complaint  Patient presents with  . Migraine    rm 7, "migraines 2-3 x a week, goes front to back of head, feels like pins in back of head/neck; Tylenol helps but it comes back; began 6-7 mos ago"  . Follow-up    last OV 09/2012    HISTORY OF PRESENT ILLNESS:   UPDATE 06/05/15 ([facility]): Here to re-establish care. Having HA ~ 2-3 per week; sharp pins and needles. Tylenol helps. Now seeing psychiatry for bipolar (abilify, lamictal, remeron).   UPDATE 07/12/13 ([facility]): Since last visit, headaches are not improved. She has continued to take Amitriptyline nightly without benefit. Sumatriptan did not relieve the headaches. She reports that headaches come either in the morning hours or the evening, lasting 1-2 hours at at a time. Sometimes has more than one per day. Does not identify with any triggers. Seems more concerned with right knee pain after a fall and cramping in her right toes. She has not had recent eye exam.   UPDATE 09/26/12: Since last visit patient had one no-show appointment. Then was lost to followup. Patient continues that 6 severe headaches per month, mainly right-sided throbbing with redness in the right eye. Headaches can last a few hours at a time. Patient is off of all medications except over-the-counter Tylenol. In the past we have tried Topamax and gabapentin without relief. We also tried Cymbalta without relief.   UPDATE 07/31/10: Still with headaches. On higher gabapentin, but no relief. Went to ER and given tramadol, but got nausea. Not yet back to Dr. Katy Fitch. Now with depression because she feels taht her headaches are keeping her from getting a job.   UPDATE 07/16/10: Still with headaches, numbness and blurred vision. Saw Dr. Katy Fitch who suspected high risk for glaucoma (increased  cup-disc ratio, right worse than left). She will see him back in Aug 2012. Gabapentin is helping her HA.   PRIOR HPI: 40 year old right-handed female with history of palpitations and panic attacks 10 years ago, here for evaluation of headaches, vision changes and numbness x 3 weeks. Patient reports intermittent episodes of bilateral occipital electrical sensation radiating to her bitemporal regions. This lasts for 15 minutes at a time. During these episodes her vision in both eyes blacks out. She did not lose consciousness or fall down. Her vision loss last for 5 minutes at a time. These episodes are occurring 1-2 times every 2 days. She has no nausea or vomiting with these episodes. In addition over the past 3 weeks she is fallen down 3 times. She feels as though her legs give out underneath her without warning. She tends to feel more weakness on her right arm and right leg. In addition her left toes feel numb.     REVIEW OF SYSTEMS: Full 14 system review of systems performed and negative with exception of: confusion memory loss headache numbness intermittent suicidal thoughts (not currently) chest pain loss of vision ear pain reduced appetite.    ALLERGIES: Allergies  Allergen Reactions  . Codeine Rash  . Penicillins Rash  . Sulfa Antibiotics Rash    HOME MEDICATIONS: Outpatient Prescriptions Prior to Visit  Medication Sig Dispense Refill  . acetaminophen (TYLENOL) 325 MG tablet Take 325 mg by mouth every 6 (six) hours as needed for mild pain or headache.    . albuterol (PROVENTIL  HFA;VENTOLIN HFA) 108 (90 Base) MCG/ACT inhaler Inhale 2 puffs into the lungs every 6 (six) hours as needed for wheezing or shortness of breath. 3.7 g 0  . guaiFENesin (MUCINEX) 600 MG 12 hr tablet Take 1 tablet (600 mg total) by mouth 2 (two) times daily. 20 tablet 0  . montelukast (SINGULAIR) 10 MG tablet Take 1 tablet (10 mg total) by mouth at bedtime. 30 tablet 0  . naproxen (NAPROSYN) 500 MG tablet Take 1  tablet (500 mg total) by mouth 2 (two) times daily. 30 tablet 0  . Prenatal Vit-Fe Fumarate-FA (PRENATAL MULTIVITAMIN) TABS tablet Take 1 tablet by mouth daily at 12 noon.    Marland Kitchen azithromycin (ZITHROMAX) 250 MG tablet Take 1 tablet (250 mg total) by mouth daily. Take first 2 tablets together, then 1 every day until finished. 6 tablet 0  . benzonatate (TESSALON) 100 MG capsule Take 1 capsule (100 mg total) by mouth 3 (three) times daily as needed for cough. 21 capsule 0  . fluconazole (DIFLUCAN) 150 MG tablet Take 1 tablet (150 mg total) by mouth once. 1 tablet 0  . metroNIDAZOLE (FLAGYL) 500 MG tablet Take 500 mg by mouth 2 (two) times daily. Started medication a week ago from today (10-13-13)     No facility-administered medications prior to visit.    PAST MEDICAL HISTORY: Past Medical History  Diagnosis Date  . Migraine   . Migraine   . Abnormal Pap smear     colpo with biopsy at age 82  . Infertility     able to become pregnant with Clomid  . Bipolar 1 disorder ([place])   . PTSD (post-traumatic stress disorder)     seeing neuropsychologist  . Low iron     PAST SURGICAL HISTORY: Past Surgical History  Procedure Laterality Date  . No past surgeries      FAMILY HISTORY: Family History  Problem Relation Age of Onset  . Diabetes Mother   . Hypertension Mother   . Diabetes Father   . Hypertension Father   . Alzheimer's disease Father   . Other Sister     breast cysts  . Breast cancer Paternal Aunt     had mastectomy  . Alzheimer's disease Paternal Aunt     SOCIAL HISTORY:  Social History   Social History  . Marital Status: Legally Separated    Spouse Name: N/A  . Number of Children: 1  . Years of Education: College   Occupational History  .      N/a   Social History Main Topics  . Smoking status: Never Smoker   . Smokeless tobacco: Never Used  . Alcohol Use: No  . Drug Use: No  . Sexual Activity: Yes    Birth Control/ Protection: None   Other Topics Concern    . Not on file   Social History Narrative   Patient lives at home with family.   Caffeine Use: 1 soda weekly     PHYSICAL EXAM  GENERAL EXAM/CONSTITUTIONAL: Vitals:  Filed Vitals:   06/05/15 1458  BP: 118/81  Pulse: 76  Height: 5\' 7"  (1.702 m)  Weight: 124 lb (56.246 kg)     Body mass index is 19.42 kg/(m^2).  No exam data present  Patient is in no distress; well developed, nourished and groomed; neck is supple  CARDIOVASCULAR:  Examination of carotid arteries is normal; no carotid bruits  Regular rate and rhythm, no murmurs  Examination of peripheral vascular system by observation and palpation is normal  EYES:  Ophthalmoscopic exam of optic discs and posterior segments is normal; no papilledema or hemorrhages  MUSCULOSKELETAL:  Gait, strength, tone, movements noted in Neurologic exam below  NEUROLOGIC: MENTAL STATUS:  No flowsheet data found.  awake, alert, oriented to person, place and time  recent and remote memory intact  normal attention and concentration  language fluent, comprehension intact, naming intact,   fund of knowledge appropriate  CRANIAL NERVE:   2nd - no papilledema on fundoscopic exam  2nd, 3rd, 4th, 6th - pupils equal and reactive to light, visual fields full to confrontation, extraocular muscles intact, no nystagmus  5th - facial sensation symmetric  7th - facial strength symmetric  8th - hearing intact  9th - palate elevates symmetrically, uvula midline  11th - shoulder shrug symmetric  12th - tongue protrusion midline  MOTOR:   normal bulk and tone, full strength in the BUE, BLE  SENSORY:   normal and symmetric to light touch, pinprick, temperature, vibration  COORDINATION:   finger-nose-finger, fine finger movements normal  REFLEXES:   deep tendon reflexes present and symmetric  GAIT/STATION:   narrow based gait; able to walk on toes, heels and tandem; romberg is negative    DIAGNOSTIC DATA  (LABS, IMAGING, TESTING) - I reviewed patient records, labs, notes, testing and imaging myself where available.  Lab Results  Component Value Date   WBC 8.3 07/14/2013   HGB 11.2* 07/14/2013   HCT 34.7* 07/14/2013   MCV 78.0 07/14/2013   PLT 295 07/14/2013      Component Value Date/Time   NA 139 03/27/2013 1445   K 4.0 03/27/2013 1445   CL 103 03/27/2013 1445   CO2 23 03/27/2013 1445   GLUCOSE 112* 03/27/2013 1445   BUN 7 03/27/2013 1445   CREATININE 0.98 03/27/2013 1445   CALCIUM 8.2* 03/27/2013 1445   PROT 5.7* 03/27/2013 1445   ALBUMIN 3.0* 03/27/2013 1445   AST 31 03/27/2013 1445   ALT 14 03/27/2013 1445   ALKPHOS 44 03/27/2013 1445   BILITOT <0.2* 03/27/2013 1445   GFRNONAA 72* 03/27/2013 1445   GFRAA 83* 03/27/2013 1445   No results found for: CHOL, HDL, LDLCALC, LDLDIRECT, TRIG, CHOLHDL No results found for: HGBA1C No results found for: VITAMINB12 No results found for: TSH  08/15/11 MRI BRAIN - normal    ASSESSMENT AND PLAN  40 y.o. year old female here with chronic headaches, some migraine features, some occipital neuralgia features. Patient has tried and failed gabapentin, topamax, cymbalta, propranolol, amitriptyline in the past.   Dx:   Migraine without aura and without status migrainosus, not intractable  Bilateral occipital neuralgia    PLAN:  - continue current medications (will hold off on other preventatives due to polypharmacy, bipolar disorder, and failed meds in past) - tylenol and ibuprofen as needed - non-medication strategies reviewed for headache control (nutrition, exercise, relaxation techniques)  Return in about 3 months (around 09/04/2015).    Penni Bombard, MD [id number], [id number] PM Certified in Neurology, Neurophysiology and Neuroimaging  [facility] [address] [place], [place] [address] [phone]

## 2015-06-05 NOTE — Patient Instructions (Signed)
Thank you for coming to see [place] at [facility]. I hope we have been able to provide you high quality care today.  You may receive a patient satisfaction survey over the next few weeks. We would appreciate your feedback and comments so that we may continue to improve ourselves and the health of our patients.  - continue current medications - tylenol and ibuprofen as needed - non-medication strategies reviewed for headache control (nutrition, exercise, relaxation techniques)    ~~~~~~~~~~~~~~~~~~~~~~~~~~~~~~~~~~~~~~~~~~~~~~~~~~~~~~~~~~~~~~~~~  DR. PENUMALLI'S GUIDE TO HAPPY AND HEALTHY LIVING These are some of my general health and wellness recommendations. Some of them may apply to you better than others. Please use common sense as you try these suggestions and feel free to ask me any questions.   ACTIVITY/FITNESS Mental, social, emotional and physical stimulation are very important for brain and body health. Try learning a new activity (arts, music, language, sports, games).  Keep moving your body to the best of your abilities. You can do this at home, inside or outside, the park, community center, gym or anywhere you like. Consider a physical therapist or personal trainer to get started. Consider the app Sworkit. Fitness trackers such as smart-watches, smart-phones or Fitbits can help as well.   NUTRITION Eat more plants: colorful vegetables, nuts, seeds and berries.  Eat less sugar, salt, preservatives and processed foods.  Avoid toxins such as cigarettes and alcohol.  Drink water when you are thirsty. Warm water with a slice of lemon is an excellent morning drink to start the day.  Consider these websites for more information The Nutrition [employer] (https://www.henry-hernandez.biz/) [employer] Nutrition (WindowBlog.ch)   RELAXATION Consider practicing mindfulness meditation or other relaxation techniques such as deep  breathing, prayer, yoga, tai chi, massage. See website mindful.org or the apps Headspace or Calm to help get started.   SLEEP Try to get at least 7-8+ hours sleep per day. Regular exercise and reduced caffeine will help you sleep better. Practice good sleep hygeine techniques. See website sleep.org for more information.   PLANNING Prepare estate planning, living will, healthcare POA documents. Sometimes this is best planned with the help of an attorney. Theconversationproject.org and agingwithdignity.org are excellent resources.

## 2015-06-13 ENCOUNTER — Ambulatory Visit (INDEPENDENT_AMBULATORY_CARE_PROVIDER_SITE_OTHER): Payer: Medicaid Other | Admitting: Family Medicine

## 2015-06-13 ENCOUNTER — Encounter: Payer: Self-pay | Admitting: Family Medicine

## 2015-06-13 VITALS — BP 119/79 | HR 76 | Ht 67.0 in | Wt 124.0 lb

## 2015-06-13 DIAGNOSIS — Z0000 Encounter for general adult medical examination without abnormal findings: Secondary | ICD-10-CM

## 2015-06-13 DIAGNOSIS — B373 Candidiasis of vulva and vagina: Secondary | ICD-10-CM | POA: Diagnosis not present

## 2015-06-13 DIAGNOSIS — Z01419 Encounter for gynecological examination (general) (routine) without abnormal findings: Secondary | ICD-10-CM

## 2015-06-13 MED ORDER — MISOPROSTOL 200 MCG PO TABS: 600.0000 ug | ORAL_TABLET | Freq: Once | ORAL | Status: DC

## 2015-06-13 MED ORDER — FLUCONAZOLE 150 MG PO TABS: 150.0000 mg | ORAL_TABLET | ORAL | Status: DC

## 2015-06-13 NOTE — Progress Notes (Signed)
  Subjective:     Vicki Mack is a 40 y.o. female and is here for a comprehensive physical exam. The patient reports problems - vaginal itching with clumpy white discharge.  Not on contraception.  Periods every other month.  No intramenstrual spotting or bleeding.  Social History   Social History  . Marital Status: Legally Separated    Spouse Name: N/A  . Number of Children: 1  . Years of Education: College   Occupational History  .      N/a   Social History Main Topics  . Smoking status: Never Smoker   . Smokeless tobacco: Never Used  . Alcohol Use: No  . Drug Use: No  . Sexual Activity: Yes    Birth Control/ Protection: None   Other Topics Concern  . Not on file   Social History Narrative   Patient lives at home with family.   Caffeine Use: 1 soda weekly   Health Maintenance  Topic Date Due  . TETANUS/TDAP  05/02/1992  . INFLUENZA VACCINE  09/17/2015  . PAP SMEAR  10/04/2016  . HIV Screening  Completed    The following portions of the patient's history were reviewed and updated as appropriate: allergies, current medications, past family history, past medical history, past social history, past surgical history and problem list.  Review of Systems Pertinent items are noted in HPI.   Objective:    BP 119/79 mmHg  Pulse 76  Ht 5\' 7"  (1.702 m)  Wt 124 lb (56.246 kg)  BMI 19.42 kg/m2  LMP 04/28/2015 (Exact Date)  Breastfeeding? No General appearance: alert, cooperative and no distress Head: Normocephalic, without obvious abnormality, atraumatic Eyes: conjunctivae/corneas clear. PERRL, EOM's intact. Fundi benign. Ears: normal TM's and external ear canals both ears Neck: no adenopathy, no carotid bruit, no JVD, supple, symmetrical, trachea midline and thyroid not enlarged, symmetric, no tenderness/mass/nodules Back: symmetric, no curvature. ROM normal. No CVA tenderness. Lungs: clear to auscultation bilaterally Heart: regular rate and rhythm, S1, S2  normal, no murmur, click, rub or gallop Abdomen: soft, non-tender; bowel sounds normal; no masses,  no organomegaly Pelvic: cervix normal in appearance, external genitalia normal, no adnexal masses or tenderness, no cervical motion tenderness, rectovaginal septum normal and discharge typical of yeast vaginitis Extremities: extremities normal, atraumatic, no cyanosis or edema Pulses: 2+ and symmetric Skin: Skin color, texture, turgor normal. No rashes or lesions Lymph nodes: Cervical, supraclavicular, and axillary nodes normal. Neurologic: Alert and oriented X 3, normal strength and tone. Normal symmetric reflexes. Normal coordination and gait    Assessment:    Healthy female exam. Yeast vaginitis      Plan:    Diflucan prescribed. Discussed contraception - pt would like IUD.  Will schedule for insertion with cytotec night before. See After Visit Summary for Counseling Recommendations

## 2015-06-13 NOTE — Patient Instructions (Signed)
Levonorgestrel intrauterine device (IUD) What is this medicine? LEVONORGESTREL IUD (LEE voe nor jes trel) is a contraceptive (birth control) device. The device is placed inside the uterus by a healthcare professional. It is used to prevent pregnancy and can also be used to treat heavy bleeding that occurs during your period. Depending on the device, it can be used for 3 to 5 years. This medicine may be used for other purposes; ask your health care provider or pharmacist if you have questions. What should I tell my health care provider before I take this medicine? They need to know if you have any of these conditions: -abnormal Pap smear -cancer of the breast, uterus, or cervix -diabetes -endometritis -genital or pelvic infection now or in the past -have more than one sexual partner or your partner has more than one partner -heart disease -history of an ectopic or tubal pregnancy -immune system problems -IUD in place -liver disease or tumor -problems with blood clots or take blood-thinners -use intravenous drugs -uterus of unusual shape -vaginal bleeding that has not been explained -an unusual or allergic reaction to levonorgestrel, other hormones, silicone, or polyethylene, medicines, foods, dyes, or preservatives -pregnant or trying to get pregnant -breast-feeding How should I use this medicine? This device is placed inside the uterus by a health care professional. Talk to your pediatrician regarding the use of this medicine in children. Special care may be needed. Overdosage: If you think you have taken too much of this medicine contact a poison control center or emergency room at once. NOTE: This medicine is only for you. Do not share this medicine with others. What if I miss a dose? This does not apply. What may interact with this medicine? Do not take this medicine with any of the following medications: -amprenavir -bosentan -fosamprenavir This medicine may also interact with  the following medications: -aprepitant -barbiturate medicines for inducing sleep or treating seizures -bexarotene -griseofulvin -medicines to treat seizures like carbamazepine, ethotoin, felbamate, oxcarbazepine, phenytoin, topiramate -modafinil -pioglitazone -rifabutin -rifampin -rifapentine -some medicines to treat HIV infection like atazanavir, indinavir, lopinavir, nelfinavir, tipranavir, ritonavir -St. John's wort -warfarin This list may not describe all possible interactions. Give your health care provider a list of all the medicines, herbs, non-prescription drugs, or dietary supplements you use. Also tell them if you smoke, drink alcohol, or use illegal drugs. Some items may interact with your medicine. What should I watch for while using this medicine? Visit your doctor or health care professional for regular check ups. See your doctor if you or your partner has sexual contact with others, becomes HIV positive, or gets a sexual transmitted disease. This product does not protect you against HIV infection (AIDS) or other sexually transmitted diseases. You can check the placement of the IUD yourself by reaching up to the top of your vagina with clean fingers to feel the threads. Do not pull on the threads. It is a good habit to check placement after each menstrual period. Call your doctor right away if you feel more of the IUD than just the threads or if you cannot feel the threads at all. The IUD may come out by itself. You may become pregnant if the device comes out. If you notice that the IUD has come out use a backup birth control method like condoms and call your health care provider. Using tampons will not change the position of the IUD and are okay to use during your period. What side effects may I notice from receiving this medicine?   Side effects that you should report to your doctor or health care professional as soon as possible: -allergic reactions like skin rash, itching or  hives, swelling of the face, lips, or tongue -fever, flu-like symptoms -genital sores -high blood pressure -no menstrual period for 6 weeks during use -pain, swelling, warmth in the leg -pelvic pain or tenderness -severe or sudden headache -signs of pregnancy -stomach cramping -sudden shortness of breath -trouble with balance, talking, or walking -unusual vaginal bleeding, discharge -yellowing of the eyes or skin Side effects that usually do not require medical attention (report to your doctor or health care professional if they continue or are bothersome): -acne -breast pain -change in sex drive or performance -changes in weight -cramping, dizziness, or faintness while the device is being inserted -headache -irregular menstrual bleeding within first 3 to 6 months of use -nausea This list may not describe all possible side effects. Call your doctor for medical advice about side effects. You may report side effects to FDA at 1-800-FDA-1088. Where should I keep my medicine? This does not apply. NOTE: This sheet is a summary. It may not cover all possible information. If you have questions about this medicine, talk to your doctor, pharmacist, or health care provider.    2016, Elsevier/Gold Standard. (2011-03-05 13:54:04)  

## 2015-06-22 ENCOUNTER — Encounter (HOSPITAL_COMMUNITY): Payer: Self-pay | Admitting: *Deleted

## 2015-06-22 ENCOUNTER — Emergency Department (HOSPITAL_COMMUNITY)
Admission: EM | Admit: 2015-06-22 | Discharge: 2015-06-22 | Disposition: A | Discharge status: Home / Self Care | Payer: Medicaid Other | Attending: Emergency Medicine | Admitting: Emergency Medicine

## 2015-06-22 DIAGNOSIS — Z79899 Other long term (current) drug therapy: Secondary | ICD-10-CM | POA: Diagnosis not present

## 2015-06-22 DIAGNOSIS — Z8742 Personal history of other diseases of the female genital tract: Secondary | ICD-10-CM | POA: Insufficient documentation

## 2015-06-22 DIAGNOSIS — H5712 Ocular pain, left eye: Secondary | ICD-10-CM | POA: Diagnosis present

## 2015-06-22 DIAGNOSIS — F319 Bipolar disorder, unspecified: Secondary | ICD-10-CM | POA: Diagnosis not present

## 2015-06-22 DIAGNOSIS — G43909 Migraine, unspecified, not intractable, without status migrainosus: Secondary | ICD-10-CM | POA: Diagnosis not present

## 2015-06-22 DIAGNOSIS — Z880 Allergy status to penicillin: Secondary | ICD-10-CM | POA: Insufficient documentation

## 2015-06-22 DIAGNOSIS — Z862 Personal history of diseases of the blood and blood-forming organs and certain disorders involving the immune mechanism: Secondary | ICD-10-CM | POA: Insufficient documentation

## 2015-06-22 DIAGNOSIS — Z77098 Contact with and (suspected) exposure to other hazardous, chiefly nonmedicinal, chemicals: Secondary | ICD-10-CM | POA: Diagnosis not present

## 2015-06-22 DIAGNOSIS — F4310 Post-traumatic stress disorder, unspecified: Secondary | ICD-10-CM | POA: Insufficient documentation

## 2015-06-22 MED ORDER — FLUORESCEIN SODIUM 1 MG OP STRP
1.0000 | ORAL_STRIP | Freq: Once | OPHTHALMIC | Status: AC
  Administered 2015-06-22: 1 via OPHTHALMIC
  Filled 2015-06-22: qty 1

## 2015-06-22 MED ORDER — TETRACAINE HCL 0.5 % OP SOLN
1.0000 [drp] | Freq: Once | OPHTHALMIC | Status: AC
  Administered 2015-06-22: 1 [drp] via OPHTHALMIC
  Filled 2015-06-22: qty 2

## 2015-06-22 NOTE — Discharge Instructions (Signed)
Artificial Tears eye ointment What is this medicine? ARTIFICIAL TEARS (ahr tuh FISH uhl teerz) soothes irritation and discomfort caused by dry eyes. This medicine may be used for other purposes; ask your health care provider or pharmacist if you have questions. What should I tell my health care provider before I take this medicine? -change in vision -eye infection or trauma -wear contact lenses -an unusual or allergic reaction to artificial tears, other medicines, foods, dyes, or preservatives -pregnant or trying to get pregnant -breast-feeding How should I use this medicine? This medicine is only for use in the eye. Do not take by mouth. Follow the directions on the label. Wash hands before and after use. Pull down the lower lid of the affected eye and apply a small amount of ointment, roughly one fourth inch, to the inside of the eyelid. Close the eye gently for a few moments to allow contact with the eye. Wipe away any residue with a clean tissue. Use your medicine at regular intervals. Do not use your medicine more often than directed. Talk to your pediatrician regarding the use of this medicine in children. While this medicine may be used in children as young as 6 years for selected conditions, precautions do apply. Overdosage: If you think you have taken too much of this medicine contact a poison control center or emergency room at once. NOTE: This medicine is only for you. Do not share this medicine with others. What if I miss a dose? If you miss a dose, use it as soon as you can. If it is almost time for your next dose, use only that dose. Do not use double or extra doses. What may interact with this medicine? Interactions are not expected. If you are also using eye drops of any type, use the drops roughly 10 minutes before application of the eye ointment so that the eye ointment does not interfere with the action of the drops. This list may not describe all possible interactions. Give  your health care provider a list of all the medicines, herbs, non-prescription drugs, or dietary supplements you use. Also tell them if you smoke, drink alcohol, or use illegal drugs. Some items may interact with your medicine. What should I watch for while using this medicine? If you experience eye pain, changes in vision, continued redness or irritation of the eye, or if your eye condition gets worse or lasts longer than 72 hours, discontinue use and consult your health care professional. To avoid contamination of this product, do not touch the tip of the container to any surface. Do not share this medicine with others. If the product changes color do not use. If you wear contact lenses, you should remove them before putting the ointment in your eyes. Wait at least 15 minutes after putting the ointment in your eyes before putting your contact lenses back in. What side effects may I notice from receiving this medicine? Side effects that you should report to your doctor or health care professional as soon as possible: -allergic reactions like skin rash, itching or hives, swelling of the face, lips, or tongue -change in vision -eye irritation or redness that gets worse or lasts more than 72 hours -eye pain Side effects that usually do not require medical attention (report to your doctor or health care professional if they continue or are bothersome): -temporary stinging or blurred vision when applying the eye drops This list may not describe all possible side effects. Call your doctor for medical advice about  side effects. You may report side effects to FDA at 1-800-FDA-1088. Where should I keep my medicine? Keep out of the reach of children. Store at room temperature between 15 and 30 degrees C (59 and 86 degrees F). Do not freeze. Throw away any unused medicine after the expiration date. Once the product is opened, most experts recommend discarding the product after 30 days. NOTE: This sheet is a  summary. It may not cover all possible information. If you have questions about this medicine, talk to your doctor, pharmacist, or health care provider.    2016, Elsevier/Gold Standard. (2007-08-05 14:24:35) Eye Foreign Body A foreign body refers to any object on the surface of the eye or in the eyeball that should not be there. A foreign body may be a small speck of dirt or dust, a hair or eyelash, a splinter, or any other object.  SIGNS AND SYMPTOMS Symptoms depend on what the foreign body is and where it is in the eye. The most common locations are:   On the inner surface of the upper or lower eyelids or on the covering of the white part of the eye (conjunctiva). Symptoms in this location are:  Pain and irritation, especially when blinking.  The feeling that something is in the eye.  On the surface of the clear covering on the front of the eye (cornea). Symptoms in this location include:  Pain and irritation.   Small "rust rings" around a metallic foreign body.  The feeling that something is in the eye.   Inside the eyeball. Foreign bodies inside the eye may cause:   Great pain.   Immediate loss of vision.   Distortion of the pupil. DIAGNOSIS  Foreign bodies are found during an exam by an eye specialist. Those on the eyelids, conjunctiva, or cornea are usually (but not always) easily found. When a foreign body is inside the eyeball, a cloudiness of the lens (cataract) may form almost right away. This makes it hard for an eye specialist to find the foreign body. Tests may be needed, including ultrasound testing, X-rays, and CT scans. TREATMENT   Foreign bodies on the eyelids, conjunctiva, or cornea are often removed easily and painlessly.  Rust in the cornea may require the use of a drill-like instrument to remove the rust.  If the foreign body has caused a scratch or a rubbing or scraping (abrasion) of the cornea, this may be treated with antibiotic drops or ointment. A  pressure patch may be put over your eye.  If the foreign body is inside your eyeball, surgery is needed right away. This is a medical emergency. Foreign bodies inside the eye threaten vision. A person may even lose his or her eye. HOME CARE INSTRUCTIONS   Take medicines only as directed by your health care provider. Use eye drops or ointment as directed.  If no eye patch was applied:  Keep your eye closed as much as possible.  Do not rub your eye.  Wear dark glasses as needed to protect your eyes from bright light.  Do not wear contact lenses until your eye feels normal again, or as instructed by your health care provider.  Wear a protective eye covering if there is a risk of eye injury. This is important when working with high-speed tools.  If your eye is patched:  Follow your health care provider's instructions for when to remove the patch.  Do notdrive or operate machinery if your eye is patched. Your ability to judge distances  is impaired.  Keep all follow-up visits as directed by your health care provider. This is important. SEEK MEDICAL CARE IF:   You have increased pain in your eye.  Your vision gets worse.   You have problems with your eye patch.   You have fluid (discharge) coming from your injured eye.   You have redness and swelling around your affected eye.  MAKE SURE YOU:   Understand these instructions.  Will watch your condition.  Will get help right away if you are not doing well or get worse.   This information is not intended to replace advice given to you by your health care provider. Make sure you discuss any questions you have with your health care provider.   Document Released: 02/02/2005 Document Revised: 02/23/2014 Document Reviewed: 06/30/2012 Elsevier Interactive Patient Education [employer].

## 2015-06-22 NOTE — ED Notes (Signed)
Declined W/C at D/C and was escorted to lobby by RN. 

## 2015-06-22 NOTE — ED Notes (Signed)
Pt reports hair dye splashed in her LT eye. Pt rinsed the lt eye with water.

## 2015-06-22 NOTE — ED Provider Notes (Signed)
CSN: [id number]     Arrival date & time 06/22/15  1603 History  By signing my name below, I, Hansel Feinstein, attest that this documentation has been prepared under the direction and in the presence of Waynetta Pean, PA-C. Electronically Signed: Hansel Feinstein, ED Scribe. 06/22/2015. 4:37 PM.    Chief Complaint  Patient presents with  . Eye Pain   The history is provided by the patient. No language interpreter was used.   HPI Comments: Vicki Mack is a 40 y.o. female who presents to the Emergency Department complaining of a foreign body sensation to the left eye onset at 2:30PM s/p chemical exposure with associated left eye pain. Pt states that she was using a home perm when her left eye was splashed. She reports that she then flushed her eye with cool water as directed on the label before seeking treatment. No worsening or alleviating factors noted. Pt wears glasses, but no contacts. She denies visual disturbance, fever, abdominal pain, sore throat, difficulty swallowing, SOB.   Past Medical History  Diagnosis Date  . Migraine   . Migraine   . Abnormal Pap smear     colpo with biopsy at age 63  . Infertility     able to become pregnant with Clomid  . Bipolar 1 disorder ([place])   . PTSD (post-traumatic stress disorder)     seeing neuropsychologist  . Low iron    Past Surgical History  Procedure Laterality Date  . No past surgeries     Family History  Problem Relation Age of Onset  . Diabetes Mother   . Hypertension Mother   . Diabetes Father   . Hypertension Father   . Alzheimer's disease Father   . Other Sister     breast cysts  . Breast cancer Paternal Aunt     had mastectomy  . Alzheimer's disease Paternal Aunt    Social History  Substance Use Topics  . Smoking status: Never Smoker   . Smokeless tobacco: Never Used  . Alcohol Use: No   OB History    Gravida Para Term Preterm AB TAB SAB Ectopic Multiple Living   3 1 1  2  2   1      Review of Systems   Constitutional: Negative for fever and chills.  HENT: Negative for sore throat and trouble swallowing.   Eyes: Positive for pain (left eye). Negative for visual disturbance.       +foreign body sensation to left eye  Respiratory: Negative for shortness of breath.   Gastrointestinal: Negative for abdominal pain.   Allergies  Codeine; Penicillins; and Sulfa antibiotics  Home Medications   Prior to Admission medications   Medication Sig Start Date End Date Taking? Authorizing Provider  acetaminophen (TYLENOL) 325 MG tablet Take 325 mg by mouth every 6 (six) hours as needed for mild pain or headache. Reported on 06/13/2015    Historical Provider, MD  ARIPiprazole (ABILIFY) 2 MG tablet Take 2 mg by mouth daily. At bedtime    Historical Provider, MD  fluconazole (DIFLUCAN) 150 MG tablet Take 1 tablet (150 mg total) by mouth every 3 (three) days. 06/13/15   Truett Mainland, DO  lamoTRIgine (LAMICTAL) 25 MG tablet Take 25 mg by mouth daily. At bedtime    Historical Provider, MD  mirtazapine (REMERON) 15 MG tablet Take 15 mg by mouth at bedtime.    Historical Provider, MD  misoprostol (CYTOTEC) 200 MCG tablet Take 3 tablets (600 mcg total) by mouth once. 06/13/15  Truett Mainland, DO  Prenatal Vit-Fe Fumarate-FA (PRENATAL MULTIVITAMIN) TABS tablet Take 1 tablet by mouth daily at 12 noon. Reported on 06/13/2015    Historical Provider, MD   BP 120/76 mmHg  Pulse 84  Temp(Src) 98.5 F (36.9 C) (Oral)  Resp 18  SpO2 100%  LMP 04/28/2015 (Exact Date) Physical Exam  Constitutional: She appears well-developed and well-nourished. No distress.  HENT:  Head: Normocephalic and atraumatic.  Eyes: Pupils are equal, round, and reactive to light. Right eye exhibits no discharge. Left eye exhibits no discharge. No foreign body present in the left eye. Left conjunctiva is injected.  Slit lamp exam:      The left eye shows no corneal abrasion, no corneal flare, no corneal ulcer, no foreign body, no hyphema,  no hypopyon and no fluorescein uptake.  Left conjunctiva mildly injected.   Right eye pH: 7.5  Left eye pH: 7.5   Pulmonary/Chest: Effort normal. No respiratory distress.  Neurological: She is alert. Coordination normal.  Skin: No rash noted. She is not diaphoretic.  Psychiatric: She has a normal mood and affect. Her behavior is normal.  Nursing note and vitals reviewed.    Visual Acuity  Right Eye Distance: 20/70 Left Eye Distance: 20/40 Bilateral Distance: 20/40  Right Eye Near:   Left Eye Near:    Bilateral Near:     ED Course  Procedures (including critical care time) DIAGNOSTIC STUDIES: Oxygen Saturation is 100% on RA, normal by my interpretation.    COORDINATION OF CARE: 4:26 PM Discussed treatment plan with pt at bedside which includes ophthalmic f/u and pt agreed to plan. Advised pt to utilize saline eye drops for irritation.     MDM   Meds given in ED:  Medications  fluorescein ophthalmic strip 1 strip (1 strip Both Eyes Given 06/22/15 1641)  tetracaine (PONTOCAINE) 0.5 % ophthalmic solution 1 drop (1 drop Both Eyes Given 06/22/15 1641)    New Prescriptions   No medications on file    Final diagnoses:  Chemical exposure of eye   This  is a 40 y.o. female who presents to the Emergency Department complaining of a foreign body sensation to the left eye onset at 2:30PM s/p chemical exposure with associated left eye pain. Pt states that she was using a home perm when her left eye was splashed. She reports that she then flushed her eye with cool water as directed on the label before seeking treatment. No worsening or alleviating factors noted. Pt wears glasses, but no contacts. On exam patient is afebrile and nontoxic-appearing. She has very mild left eye conjunctival injection. EOMs are intact. Vision is grossly intact. Patient has better visual acuity in her left eye than her right eye. The pH of her bilateral eyes is 7.5. Her left eye was anesthetized with tetracaine  and stained with forcing. No corneal abrasions or ulcers. No evidence of foreign body. I advised patient could use normal saline eyedrops and if her symptoms persist she should follow-up with ophthalmologist Dr. Posey Pronto. I advised the patient to follow-up with their primary care provider this week. I advised the patient to return to the emergency department with new or worsening symptoms or new concerns. The patient verbalized understanding and agreement with plan.    I personally performed the services described in this documentation, which was scribed in my presence. The recorded information has been reviewed and is accurate.      Waynetta Pean, PA-C 06/22/15 [place], MD 06/23/15 [phone]

## 2015-07-19 ENCOUNTER — Ambulatory Visit (INDEPENDENT_AMBULATORY_CARE_PROVIDER_SITE_OTHER): Payer: Medicaid Other | Admitting: Family Medicine

## 2015-07-19 ENCOUNTER — Encounter: Payer: Self-pay | Admitting: Family Medicine

## 2015-07-19 VITALS — BP 141/85 | HR 73 | Wt 119.4 lb

## 2015-07-19 DIAGNOSIS — Z30430 Encounter for insertion of intrauterine contraceptive device: Secondary | ICD-10-CM | POA: Diagnosis present

## 2015-07-19 DIAGNOSIS — Z3202 Encounter for pregnancy test, result negative: Secondary | ICD-10-CM

## 2015-07-19 LAB — POCT PREGNANCY, URINE: Preg Test, Ur: NEGATIVE

## 2015-07-19 NOTE — Progress Notes (Signed)
  IUD Procedure Note Patient identified, informed consent performed, signed copy in chart, time out was performed.  Urine pregnancy test negative.  Speculum placed in the vagina.  Cervix visualized.  Cleaned with Betadine x 2.  Grasped anteriorly with a single tooth tenaculum.  Uterus sounded to 7 cm.  Liletta IUD placed per manufacturer's recommendations.  Strings trimmed to 3 cm. Tenaculum was removed, good hemostasis noted.  Patient tolerated procedure well.   Patient given post procedure instructions and Liletta care card with expiration date.  Patient is asked to check IUD strings periodically and follow up in 4-6 weeks for IUD check.

## 2015-08-12 ENCOUNTER — Ambulatory Visit (INDEPENDENT_AMBULATORY_CARE_PROVIDER_SITE_OTHER): Payer: Medicaid Other | Admitting: Family Medicine

## 2015-08-12 ENCOUNTER — Encounter: Payer: Self-pay | Admitting: Family Medicine

## 2015-08-12 VITALS — BP 123/90 | HR 83 | Ht 67.0 in | Wt 123.0 lb

## 2015-08-12 DIAGNOSIS — Z30431 Encounter for routine checking of intrauterine contraceptive device: Secondary | ICD-10-CM

## 2015-08-12 NOTE — Progress Notes (Signed)
   Subjective:   Patient Name: Vicki Mack, female   DOB: Sep 23, 1975, 40 y.o.  MRN: [id number]  HPI Patient here for an IUD check.  She had the [place] IUD placed 1 month ago.  She reports mild spotting and occasional cramping, improved with ibuprofen.   Review of Systems  Constitutional: Negative for fever and chills.  Gastrointestinal: Negative for abdominal pain.  Genitourinary: Negative for vaginal discharge, vaginal pain, pelvic pain and dyspareunia.       Objective:   Physical Exam  Constitutional: She appears well-developed and well-nourished.  HENT:  Head: Normocephalic and atraumatic.  Abdominal: Soft. There is no tenderness. There is no guarding.  Genitourinary: There is no rash, tenderness or lesion on the right labia. There is no rash, tenderness or lesion on the left labia. No erythema or tenderness in the vagina. No foreign body around the vagina. No signs of injury around the vagina. No vaginal discharge found.    Skin: Skin is warm and dry.  Psychiatric: She has a normal mood and affect. Her behavior is normal. Judgment and thought content normal.      Assessment & Plan:  1. IUD check up IUD in place.  Spotting should stop at end of next month.  If continues, pt to call - would do 1 month of progesterone to control.  Pt to call with any other problems.  Recheck in 1 year.

## 2015-08-15 ENCOUNTER — Encounter: Payer: Self-pay | Admitting: Family Medicine

## 2015-08-19 ENCOUNTER — Telehealth: Payer: Self-pay | Admitting: General Practice

## 2015-08-19 DIAGNOSIS — N921 Excessive and frequent menstruation with irregular cycle: Secondary | ICD-10-CM

## 2015-08-19 DIAGNOSIS — Z975 Presence of (intrauterine) contraceptive device: Principal | ICD-10-CM

## 2015-08-19 MED ORDER — MEDROXYPROGESTERONE ACETATE 10 MG PO TABS: 20.0000 mg | ORAL_TABLET | Freq: Every day | ORAL | Status: DC

## 2015-08-19 NOTE — Telephone Encounter (Signed)
Patient called and left message on nurse line stating she is a patient of Dr Nehemiah Settle and had an IUD inserted 6/2. Patient states she is still bleeding and he had previously mentioned giving her a medication to stop the bleeding. Patient is requesting this be called in. Per Dr Roselie Awkward, may send in Provera 20mg  daily x 1 month. Called patient, no answer- left message on voicemail that we are trying to reach you to return your phone call, we have received your message and sent a prescription to your pharmacy for you to go by and pick up. You may call us back at the clinics if you have questions.

## 2015-09-03 ENCOUNTER — Ambulatory Visit: Payer: Medicaid Other | Admitting: Sports Medicine

## 2015-09-05 ENCOUNTER — Encounter: Payer: Self-pay | Admitting: Family Medicine

## 2015-09-10 ENCOUNTER — Ambulatory Visit: Payer: Medicaid Other | Admitting: Diagnostic Neuroimaging

## 2015-09-11 ENCOUNTER — Telehealth: Payer: Self-pay | Admitting: *Deleted

## 2015-09-11 NOTE — Telephone Encounter (Signed)
Received message left on nurse voicemail by patient on 09/10/15 at 1449.  Patient states she is still bleeding and would like to have her IUD removed.  Requests a return call [phone].

## 2015-09-11 NOTE — Telephone Encounter (Signed)
Patient has an upcoming appointment on 09/24/2015 will follow up with her concerns at that time.

## 2015-09-13 NOTE — Telephone Encounter (Signed)
I called Vicki Mack and she reports she has had her IUd about 1 month - soon to be 2 months. States she was bleeding heavy, but is not now. We discussed as her body adjusts to the iud can have irregular bleeding / spotting but usually lessens and can discuss at her upcoming appointment. She voices understanding.

## 2015-09-24 ENCOUNTER — Ambulatory Visit: Payer: Medicaid Other | Admitting: Diagnostic Neuroimaging

## 2015-09-28 ENCOUNTER — Other Ambulatory Visit: Payer: Self-pay | Admitting: Obstetrics & Gynecology

## 2015-09-28 DIAGNOSIS — Z975 Presence of (intrauterine) contraceptive device: Principal | ICD-10-CM

## 2015-09-28 DIAGNOSIS — N921 Excessive and frequent menstruation with irregular cycle: Secondary | ICD-10-CM

## 2015-09-30 ENCOUNTER — Encounter: Payer: Self-pay | Admitting: Sports Medicine

## 2015-09-30 ENCOUNTER — Ambulatory Visit (INDEPENDENT_AMBULATORY_CARE_PROVIDER_SITE_OTHER): Payer: Medicaid Other

## 2015-09-30 ENCOUNTER — Ambulatory Visit (INDEPENDENT_AMBULATORY_CARE_PROVIDER_SITE_OTHER): Payer: Medicaid Other | Admitting: Sports Medicine

## 2015-09-30 VITALS — BP 110/66 | HR 77

## 2015-09-30 DIAGNOSIS — M79671 Pain in right foot: Secondary | ICD-10-CM

## 2015-09-30 DIAGNOSIS — M2040 Other hammer toe(s) (acquired), unspecified foot: Secondary | ICD-10-CM | POA: Diagnosis not present

## 2015-09-30 DIAGNOSIS — L84 Corns and callosities: Secondary | ICD-10-CM | POA: Diagnosis not present

## 2015-09-30 DIAGNOSIS — M216X9 Other acquired deformities of unspecified foot: Secondary | ICD-10-CM

## 2015-09-30 DIAGNOSIS — M79672 Pain in left foot: Secondary | ICD-10-CM

## 2015-09-30 NOTE — Progress Notes (Signed)
Subjective: Vicki Mack is a 40 y.o. female patient who presents to office for evaluation of bilateral foot pain secondary to callus skin. Patient complains of pain at the lesion present at the ball of both feet. Patient has tried creams and pads with no relief in symptoms. Patient denies any other pedal complaints.   Patient Active Problem List   Diagnosis Date Noted  . Migraine headache without aura 08/27/2011  . SAB (spontaneous abortion) 08/27/2011    Current Outpatient Prescriptions on File Prior to Visit  Medication Sig Dispense Refill  . acetaminophen (TYLENOL) 325 MG tablet Take 325 mg by mouth every 6 (six) hours as needed for mild pain or headache. Reported on 08/12/2015    . ARIPiprazole (ABILIFY) 2 MG tablet Take 2 mg by mouth daily. At bedtime    . fluconazole (DIFLUCAN) 150 MG tablet Take 1 tablet (150 mg total) by mouth every 3 (three) days. (Patient not taking: Reported on 07/19/2015) 2 tablet 0  . lamoTRIgine (LAMICTAL) 25 MG tablet Take 25 mg by mouth daily. At bedtime    . medroxyPROGESTERone (PROVERA) 10 MG tablet Take 2 tablets (20 mg total) by mouth daily. 60 tablet 0  . mirtazapine (REMERON) 15 MG tablet Take 15 mg by mouth at bedtime.    . misoprostol (CYTOTEC) 200 MCG tablet Take 3 tablets (600 mcg total) by mouth once. (Patient not taking: Reported on 07/19/2015) 3 tablet 0  . Prenatal Vit-Fe Fumarate-FA (PRENATAL MULTIVITAMIN) TABS tablet Take 1 tablet by mouth daily at 12 noon. Reported on 08/12/2015     No current facility-administered medications on file prior to visit.     Allergies  Allergen Reactions  . Codeine Rash  . Penicillins Rash  . Sulfa Antibiotics Rash    Objective:  General: Alert and oriented x3 in no acute distress  Dermatology: Keratotic lesion present sub met 2 bilateral with skin lines transversing the lesions, pain is present with direct pressure to the lesions with a central nucleated core noted, no webspace macerations, no  ecchymosis bilateral, all nails x 10 are well manicured.  Vascular: Dorsalis Pedis and Posterior Tibial pedal pulses 2/4, Capillary Fill Time 3 seconds, + pedal hair growth bilateral, no edema bilateral lower extremities, Temperature gradient within normal limits.  Neurology: Johney Maine sensation intact via light touch bilateral.  Musculoskeletal: Mild tenderness with palpation at the keratotic lesion sites bilateral, Muscular strength 5/5 in all groups without pain or limitation on range of motion. Hammertoe boney deformity noted bilateral with fat pad atrophy.  Assessment and Plan: Problem List Items Addressed This Visit    None    Visit Diagnoses    Foot pain, bilateral    -  Primary   Relevant Orders   DG Foot 2 Views Right   DG Foot 2 Views Left   Callus of foot       Hammertoe, unspecified laterality       Plantar fat pad atrophy, unspecified laterality         -Complete examination performed -Discussed treatment options -Parred keratoic lesions using a chisel blade; treated the area withSalinocaine covered with moleskin -Encouraged daily skin emollients -Encouraged use of pumice stone -Advised good supportive shoes and inserts -Recommend patient to consider hammertoe surgery in future -Patient to return to office as needed or sooner if condition worsens.  Landis Martins, DPM

## 2015-09-30 NOTE — Progress Notes (Signed)
   Subjective:    Patient ID: Vicki Mack, female    DOB: [birth year], 40 y.o.   MRN: [id number]  HPI    Review of Systems  Neurological: Positive for headaches.  All other systems reviewed and are negative.      Objective:   Physical Exam        Assessment & Plan:

## 2015-10-04 ENCOUNTER — Ambulatory Visit: Payer: Medicaid Other | Admitting: Family Medicine

## 2015-10-16 ENCOUNTER — Emergency Department (HOSPITAL_COMMUNITY)
Admission: EM | Admit: 2015-10-16 | Discharge: 2015-10-16 | Disposition: A | Discharge status: Home / Self Care | Payer: Medicaid Other | Attending: Emergency Medicine | Admitting: Emergency Medicine

## 2015-10-16 ENCOUNTER — Encounter (HOSPITAL_COMMUNITY): Payer: Self-pay

## 2015-10-16 ENCOUNTER — Emergency Department (HOSPITAL_COMMUNITY): Payer: Medicaid Other

## 2015-10-16 DIAGNOSIS — J069 Acute upper respiratory infection, unspecified: Secondary | ICD-10-CM | POA: Insufficient documentation

## 2015-10-16 DIAGNOSIS — Z79899 Other long term (current) drug therapy: Secondary | ICD-10-CM | POA: Insufficient documentation

## 2015-10-16 DIAGNOSIS — R05 Cough: Secondary | ICD-10-CM | POA: Diagnosis present

## 2015-10-16 LAB — RAPID STREP SCREEN (MED CTR MEBANE ONLY): STREPTOCOCCUS, GROUP A SCREEN (DIRECT): NEGATIVE

## 2015-10-16 MED ORDER — BENZONATATE 100 MG PO CAPS: 100.0000 mg | ORAL_CAPSULE | Freq: Three times a day (TID) | ORAL | 0 refills | Status: DC

## 2015-10-16 NOTE — ED Triage Notes (Signed)
Per Ptt, Pt started to have sore throat, dry cough, and running nose since yesterday. Took Theraflu with no relief. Reports chills, but denies any known fevers.

## 2015-10-16 NOTE — ED Notes (Signed)
Pt is in stable condition upon d/c and ambulates from ED. 

## 2015-10-16 NOTE — Discharge Instructions (Signed)
There were no abnormalities on your imaging or labs. Your symptoms are consistent with a viral illness. Viruses do not require antibiotics. Treatment is symptomatic care. Drink plenty of fluids and get plenty of rest. You should be drinking at least a liter of water an hour to stay hydrated. Ibuprofen, Naproxen, or Tylenol for pain or fever. Tessalon for cough. Warm liquids or Chloraseptic spray may help soothe the sore throat.

## 2015-10-16 NOTE — ED Provider Notes (Signed)
[facility] DEPT Provider Note   CSN: [id number] Arrival date & time: 10/16/15  1017  By signing my name below, I, Jeanell Sparrow, attest that this documentation has been prepared under the direction and in the presence of non-physician practitioner, Arlean Hopping, PA-C. Electronically Signed: Jeanell Sparrow, Scribe. 10/16/2015. 11:57 AM.  History   Chief Complaint Chief Complaint  Patient presents with  . Cough   The history is provided by the patient. No language interpreter was used.   HPI Comments: Vicki Mack is a 40 y.o. female who presents to the Emergency Department complaining of an intermittent moderate non-productive cough that started yesterday. She states she has associated sore throat, rhinorrhea, and chills. She states she took theraflu without relief. She denies any fever, vomiting, shortness of breath, or any other complaints.   Past Medical History:  Diagnosis Date  . Abnormal Pap smear    colpo with biopsy at age 26  . Bipolar 1 disorder ([place])   . Infertility    able to become pregnant with Clomid  . Low iron   . Migraine   . Migraine   . PTSD (post-traumatic stress disorder)    seeing neuropsychologist    Patient Active Problem List   Diagnosis Date Noted  . Migraine headache without aura 08/27/2011  . SAB (spontaneous abortion) 08/27/2011    Past Surgical History:  Procedure Laterality Date  . NO PAST SURGERIES      OB History    Gravida Para Term Preterm AB Living   3 1 1   2 1    SAB TAB Ectopic Multiple Live Births   2       1       Home Medications    Prior to Admission medications   Medication Sig Start Date End Date Taking? Authorizing Provider  acetaminophen (TYLENOL) 325 MG tablet Take 325 mg by mouth every 6 (six) hours as needed for mild pain or headache. Reported on 08/12/2015    Historical Provider, MD  ARIPiprazole (ABILIFY) 2 MG tablet Take 2 mg by mouth daily. At bedtime    Historical Provider, MD  benzonatate  (TESSALON) 100 MG capsule Take 1 capsule (100 mg total) by mouth every 8 (eight) hours. 10/16/15   Abrea Henle C Sostenes Kauffmann, PA-C  fluconazole (DIFLUCAN) 150 MG tablet Take 1 tablet (150 mg total) by mouth every 3 (three) days. Patient not taking: Reported on 07/19/2015 06/13/15   Truett Mainland, DO  lamoTRIgine (LAMICTAL) 25 MG tablet Take 25 mg by mouth daily. At bedtime    Historical Provider, MD  medroxyPROGESTERone (PROVERA) 10 MG tablet TAKE 2 TABLETS (20 MG TOTAL) BY MOUTH DAILY. 10/09/15   Woodroe Mode, MD  mirtazapine (REMERON) 15 MG tablet Take 15 mg by mouth at bedtime.    Historical Provider, MD  misoprostol (CYTOTEC) 200 MCG tablet Take 3 tablets (600 mcg total) by mouth once. Patient not taking: Reported on 07/19/2015 06/13/15   Truett Mainland, DO  Prenatal Vit-Fe Fumarate-FA (PRENATAL MULTIVITAMIN) TABS tablet Take 1 tablet by mouth daily at 12 noon. Reported on 08/12/2015    Historical Provider, MD    Family History Family History  Problem Relation Age of Onset  . Diabetes Mother   . Hypertension Mother   . Diabetes Father   . Hypertension Father   . Alzheimer's disease Father   . Other Sister     breast cysts  . Breast cancer Paternal Aunt     had mastectomy  . Alzheimer's disease  Paternal Aunt     Social History Social History  Substance Use Topics  . Smoking status: Never Smoker  . Smokeless tobacco: Never Used  . Alcohol use No     Allergies   Codeine; Penicillins; and Sulfa antibiotics   Review of Systems Review of Systems  Constitutional: Positive for chills. Negative for fever.  HENT: Positive for rhinorrhea and sore throat.   Respiratory: Positive for cough.   Gastrointestinal: Negative for vomiting.  All other systems reviewed and are negative.    Physical Exam Updated Vital Signs BP 119/86 (BP Location: Left Arm)   Pulse 88   Temp 98.2 F (36.8 C) (Oral)   Resp 16   Ht 5\' 7"  (1.702 m)   Wt 123 lb (55.8 kg)   SpO2 99%   BMI 19.26 kg/m   Physical  Exam  Constitutional: She appears well-developed and well-nourished. No distress.  HENT:  Head: Normocephalic and atraumatic.  Erythema to the bilateral posterior pharynx.  Eyes: Conjunctivae are normal.  Neck: Neck supple.  Cardiovascular: Normal rate, regular rhythm, normal heart sounds and intact distal pulses.   Pulmonary/Chest: Effort normal and breath sounds normal. No respiratory distress. She has no wheezes. She has no rales.  Abdominal: There is no guarding.  Musculoskeletal: She exhibits no edema or tenderness.  Lymphadenopathy:    She has no cervical adenopathy.  Neurological: She is alert.  Skin: Skin is warm and dry. She is not diaphoretic.  Psychiatric: She has a normal mood and affect. Her behavior is normal.  Nursing note and vitals reviewed.    ED Treatments / Results  DIAGNOSTIC STUDIES: Oxygen Saturation is 99% on RA, normal by my interpretation.    COORDINATION OF CARE: 12:01 AM- Pt advised of plan for treatment and pt agrees.  Labs (all labs ordered are listed, but only abnormal results are displayed) Labs Reviewed  RAPID STREP SCREEN (NOT AT [facility])  CULTURE, GROUP A STREP [facility])    EKG  EKG Interpretation None       Radiology Dg Chest 2 View  Result Date: 10/16/2015 CLINICAL DATA:  Cough and shortness of breath for 1 week. EXAM: CHEST  2 VIEW COMPARISON:  03/03/2015 and 10/13/2013 FINDINGS: The heart size and mediastinal contours are within normal limits. Mild pleural-parenchymal scarring again seen at left lung base. No evidence of pulmonary infiltrate or pleural effusion. IMPRESSION: No active cardiopulmonary disease. Electronically Signed   By: Earle Gell M.D.   On: 10/16/2015 11:52    Procedures Procedures (including critical care time)  Medications Ordered in ED Medications - No data to display   Initial Impression / Assessment and Plan / ED Course  I have reviewed the triage vital signs and the nursing notes.  Pertinent labs &  imaging results that were available during my care of the patient were reviewed by me and considered in my medical decision making (see chart for details).  Clinical Course    Vicki Mack presents with symptoms of a URI, most likely viral. Strep test and chest x-ray negative. The patient was given instructions for home care as well as return precautions. Patient voices understanding of these instructions, accepts the plan, and is comfortable with discharge.  Final Clinical Impressions(s) / ED Diagnoses   Final diagnoses:  URI (upper respiratory infection)    New Prescriptions New Prescriptions   BENZONATATE (TESSALON) 100 MG CAPSULE    Take 1 capsule (100 mg total) by mouth every 8 (eight) hours.   I personally performed  the services described in this documentation, which was scribed in my presence. The recorded information has been reviewed and is accurate.    Lorayne Bender, PA-C 10/16/15 1206    Veryl Speak, MD 10/16/15 1504

## 2015-10-18 LAB — CULTURE, GROUP A STREP (THRC)

## 2015-11-25 ENCOUNTER — Encounter: Payer: Self-pay | Admitting: Family Medicine

## 2015-11-25 NOTE — Telephone Encounter (Signed)
I called Vicki Mack. We discussed she had the IUD inserted in June of this year- at first she could feel the strings , but can't now and is also having pain on her left side- she states her pain is not severe but rates it at an 8. We discussed that as iud adjusts to her body the strings soften and curl up - so she may not be able to feel them- of course without an exam we don't know if the iud is there or has moved.  We discussed the pain may or may not be related to her iud but if she has severe pain she should go to [facility] which is open 24/7.  We discussed I could have registars call her with appt - which would probably be next week and she could  Cancel if not needed.  She would like registars to call her with appt and she understands if she has severe pain to go to [facility].

## 2015-12-06 ENCOUNTER — Ambulatory Visit: Payer: Medicaid Other | Admitting: Family Medicine

## 2015-12-17 ENCOUNTER — Emergency Department (HOSPITAL_COMMUNITY)
Admission: EM | Admit: 2015-12-17 | Discharge: 2015-12-17 | Disposition: A | Discharge status: Home / Self Care | Payer: Medicaid Other | Attending: Emergency Medicine | Admitting: Emergency Medicine

## 2015-12-17 ENCOUNTER — Emergency Department (HOSPITAL_COMMUNITY): Payer: Medicaid Other

## 2015-12-17 DIAGNOSIS — R51 Headache: Secondary | ICD-10-CM | POA: Insufficient documentation

## 2015-12-17 DIAGNOSIS — R202 Paresthesia of skin: Secondary | ICD-10-CM | POA: Diagnosis not present

## 2015-12-17 DIAGNOSIS — R519 Headache, unspecified: Secondary | ICD-10-CM

## 2015-12-17 DIAGNOSIS — Z79899 Other long term (current) drug therapy: Secondary | ICD-10-CM | POA: Insufficient documentation

## 2015-12-17 DIAGNOSIS — Z5181 Encounter for therapeutic drug level monitoring: Secondary | ICD-10-CM | POA: Diagnosis not present

## 2015-12-17 DIAGNOSIS — I10 Essential (primary) hypertension: Secondary | ICD-10-CM | POA: Insufficient documentation

## 2015-12-17 LAB — COMPREHENSIVE METABOLIC PANEL
ALT: 11 U/L — ABNORMAL LOW (ref 14–54)
ANION GAP: 7 (ref 5–15)
AST: 22 U/L (ref 15–41)
Albumin: 3.9 g/dL (ref 3.5–5.0)
Alkaline Phosphatase: 58 U/L (ref 38–126)
BILIRUBIN TOTAL: 0.1 mg/dL — AB (ref 0.3–1.2)
BUN: 6 mg/dL (ref 6–20)
CHLORIDE: 107 mmol/L (ref 101–111)
CO2: 25 mmol/L (ref 22–32)
Calcium: 9.3 mg/dL (ref 8.9–10.3)
Creatinine, Ser: 0.91 mg/dL (ref 0.44–1.00)
GFR calc Af Amer: >60 mL/min (ref >60)
Glucose, Bld: 80 mg/dL (ref 65–99)
Potassium: 4.9 mmol/L (ref 3.5–5.1)
Sodium: 139 mmol/L (ref 135–145)
TOTAL PROTEIN: 6.8 g/dL (ref 6.5–8.1)

## 2015-12-17 LAB — URINALYSIS, ROUTINE W REFLEX MICROSCOPIC
Bilirubin Urine: NEGATIVE
GLUCOSE, UA: NEGATIVE mg/dL
HGB URINE DIPSTICK: NEGATIVE
Ketones, ur: NEGATIVE mg/dL
LEUKOCYTES UA: NEGATIVE
Nitrite: NEGATIVE
PH: 7.5 (ref 5.0–8.0)
PROTEIN: NEGATIVE mg/dL
SPECIFIC GRAVITY, URINE: 1.008 (ref 1.005–1.030)

## 2015-12-17 LAB — RAPID URINE DRUG SCREEN, HOSP PERFORMED
Amphetamines: NOT DETECTED
BARBITURATES: NOT DETECTED
BENZODIAZEPINES: NOT DETECTED
COCAINE: NOT DETECTED
OPIATES: NOT DETECTED
Tetrahydrocannabinol: NOT DETECTED

## 2015-12-17 LAB — CBC
HEMATOCRIT: 36 % (ref 36.0–46.0)
Hemoglobin: 11.1 g/dL — ABNORMAL LOW (ref 12.0–15.0)
MCH: 23.6 pg — AB (ref 26.0–34.0)
MCHC: 30.8 g/dL (ref 30.0–36.0)
MCV: 76.6 fL — ABNORMAL LOW (ref 78.0–100.0)
Platelets: 229 10*3/uL (ref 150–400)
RBC: 4.7 MIL/uL (ref 3.87–5.11)
RDW: 17.8 % — AB (ref 11.5–15.5)
WBC: 6.5 10*3/uL (ref 4.0–10.5)

## 2015-12-17 LAB — I-STAT CHEM 8, ED
BUN: 6 mg/dL (ref 6–20)
Calcium, Ion: 1.16 mmol/L (ref 1.15–1.40)
Chloride: 105 mmol/L (ref 101–111)
Creatinine, Ser: 0.9 mg/dL (ref 0.44–1.00)
Glucose, Bld: 73 mg/dL (ref 65–99)
HEMATOCRIT: 38 % (ref 36.0–46.0)
HEMOGLOBIN: 12.9 g/dL (ref 12.0–15.0)
POTASSIUM: 4.9 mmol/L (ref 3.5–5.1)
Sodium: 140 mmol/L (ref 135–145)
TCO2: 24 mmol/L (ref 0–100)

## 2015-12-17 LAB — PROTIME-INR
INR: 0.98
Prothrombin Time: 13 seconds (ref 11.4–15.2)

## 2015-12-17 LAB — DIFFERENTIAL
BASOS ABS: 0.1 10*3/uL (ref 0.0–0.1)
BASOS PCT: 1 %
EOS ABS: 0.3 10*3/uL (ref 0.0–0.7)
EOS PCT: 5 %
LYMPHS ABS: 2.1 10*3/uL (ref 0.7–4.0)
Lymphocytes Relative: 32 %
MONOS PCT: 7 %
Monocytes Absolute: 0.4 10*3/uL (ref 0.1–1.0)
NEUTROS PCT: 55 %
Neutro Abs: 3.6 10*3/uL (ref 1.7–7.7)

## 2015-12-17 LAB — I-STAT TROPONIN, ED: TROPONIN I, POC: 0 ng/mL (ref 0.00–0.08)

## 2015-12-17 LAB — ETHANOL

## 2015-12-17 LAB — APTT: APTT: 27 s (ref 24–36)

## 2015-12-17 LAB — POC URINE PREG, ED: Preg Test, Ur: NEGATIVE

## 2015-12-17 MED ORDER — METOCLOPRAMIDE HCL 5 MG/ML IJ SOLN
10.0000 mg | Freq: Once | INTRAMUSCULAR | Status: AC
  Administered 2015-12-17: 10 mg via INTRAVENOUS
  Filled 2015-12-17: qty 2

## 2015-12-17 MED ORDER — DIPHENHYDRAMINE HCL 50 MG/ML IJ SOLN
25.0000 mg | Freq: Once | INTRAMUSCULAR | Status: AC
Start: 2015-12-17 — End: 2015-12-17
  Administered 2015-12-17: 25 mg via INTRAVENOUS
  Filled 2015-12-17: qty 1

## 2015-12-17 MED ORDER — BUTALBITAL-APAP-CAFFEINE 50-325-40 MG PO TABS: 1.0000 | ORAL_TABLET | Freq: Four times a day (QID) | ORAL | 0 refills | Status: DC | PRN

## 2015-12-17 NOTE — ED Notes (Addendum)
Pt returned from CT °

## 2015-12-17 NOTE — ED Provider Notes (Signed)
[facility] DEPT Provider Note   CSN: [id number] Arrival date & time: 12/17/15  [id number]     History   Chief Complaint Chief Complaint  Patient presents with  . Hypertension  . Headache    HPI Vicki Mack is a 40 y.o. female.  HPI  40 year old female presents for evaluation of a headache and hypertension. She has had a headache intermittently for the past 2 or 3 months. It is right-sided, starts in her for head and then radiates to her occiput. Patient states that the headache was very bad yesterday and she went and saw her neuropsychiatrist. She was told that her blood pressure was [id number] systolic and that she needed to come to the ER. She states she went shopping instead. While shopping she knows that her left arm, face, and leg were numb. This is associated with a particularly bad headache. The headache seemed to go away but then this morning it came back. She states she has a primary doctor has never been told she has high blood pressure. She also has single for neurology and told she has "bad headaches". She has never had any scans or MRIs. There is no weakness but she is currently having the numbness that came on yesterday. Occasionally this numbness will occur with the headaches. No neck pain or stiffness. No fevers.  Past Medical History:  Diagnosis Date  . Abnormal Pap smear    colpo with biopsy at age 58  . Bipolar 1 disorder ([place])   . Infertility    able to become pregnant with Clomid  . Low iron   . Migraine   . Migraine   . PTSD (post-traumatic stress disorder)    seeing neuropsychologist    Patient Active Problem List   Diagnosis Date Noted  . Migraine headache without aura 08/27/2011  . SAB (spontaneous abortion) 08/27/2011    Past Surgical History:  Procedure Laterality Date  . NO PAST SURGERIES      OB History    Gravida Para Term Preterm AB Living   3 1 1   2 1    SAB TAB Ectopic Multiple Live Births   2       1       Home Medications      Prior to Admission medications   Medication Sig Start Date End Date Taking? Authorizing Provider  acetaminophen (TYLENOL) 325 MG tablet Take 325 mg by mouth every 6 (six) hours as needed for mild pain or headache. Reported on 08/12/2015   Yes Historical Provider, MD  lamoTRIgine (LAMICTAL) 25 MG tablet Take 25 mg by mouth daily. At bedtime   Yes Historical Provider, MD  medroxyPROGESTERone (PROVERA) 10 MG tablet TAKE 2 TABLETS (20 MG TOTAL) BY MOUTH DAILY. 10/09/15  Yes Woodroe Mode, MD  mirtazapine (REMERON) 15 MG tablet Take 15 mg by mouth at bedtime.   Yes Historical Provider, MD  Prenatal Vit-Fe Fumarate-FA (PRENATAL MULTIVITAMIN) TABS tablet Take 1 tablet by mouth daily at 12 noon. Reported on 08/12/2015   Yes Historical Provider, MD  benzonatate (TESSALON) 100 MG capsule Take 1 capsule (100 mg total) by mouth every 8 (eight) hours. Patient not taking: Reported on 12/17/2015 10/16/15   Shawn C Joy, PA-C  butalbital-acetaminophen-caffeine (FIORICET, ESGIC) 50-325-40 MG tablet Take 1-2 tablets by mouth every 6 (six) hours as needed for headache. 12/17/15 12/16/16  Sherwood Gambler, MD  fluconazole (DIFLUCAN) 150 MG tablet Take 1 tablet (150 mg total) by mouth every 3 (three) days. Patient not taking:  Reported on 12/17/2015 06/13/15   Tanna Savoy Stinson, DO  misoprostol (CYTOTEC) 200 MCG tablet Take 3 tablets (600 mcg total) by mouth once. Patient not taking: Reported on 12/17/2015 06/13/15   Truett Mainland, DO    Family History Family History  Problem Relation Age of Onset  . Diabetes Mother   . Hypertension Mother   . Diabetes Father   . Hypertension Father   . Alzheimer's disease Father   . Other Sister     breast cysts  . Breast cancer Paternal Aunt     had mastectomy  . Alzheimer's disease Paternal Aunt     Social History Social History  Substance Use Topics  . Smoking status: Never Smoker  . Smokeless tobacco: Never Used  . Alcohol use No     Allergies   Codeine;  Penicillins; and Sulfa antibiotics   Review of Systems Review of Systems  Eyes: Positive for photophobia (mild).  Gastrointestinal: Negative for nausea and vomiting.  Musculoskeletal: Negative for neck pain and neck stiffness.  Neurological: Positive for numbness and headaches. Negative for dizziness and weakness.  All other systems reviewed and are negative.    Physical Exam Updated Vital Signs BP 116/79   Pulse 69   Temp 97.7 F (36.5 C)   Resp 17   LMP 12/02/2015   SpO2 100%   Physical Exam  Constitutional: She is oriented to person, place, and time. She appears well-developed and well-nourished.  HENT:  Head: Normocephalic and atraumatic.  Right Ear: External ear normal.  Left Ear: External ear normal.  Nose: Nose normal.  Mouth/Throat: Oropharynx is clear and moist.  Eyes: EOM are normal. Pupils are equal, round, and reactive to light. Right eye exhibits no discharge. Left eye exhibits no discharge.  Neck: Neck supple.  Cardiovascular: Normal rate, regular rhythm and normal heart sounds.   Pulmonary/Chest: Effort normal and breath sounds normal.  Abdominal: Soft. There is no tenderness.  Neurological: She is alert and oriented to person, place, and time.  CN 3-12 grossly intact. 5/5 strength in all 4 extremities. Subjectively decreased sensation to left face (seems to cross midline) and to her entire left arm and leg. She can feel me touching but reports decreased sensation. Normal finger to nose.   Skin: Skin is warm and dry.  Nursing note and vitals reviewed.    ED Treatments / Results  Labs (all labs ordered are listed, but only abnormal results are displayed) Labs Reviewed  CBC - Abnormal; Notable for the following:       Result Value   Hemoglobin 11.1 (*)    MCV 76.6 (*)    MCH 23.6 (*)    RDW 17.8 (*)    All other components within normal limits  COMPREHENSIVE METABOLIC PANEL - Abnormal; Notable for the following:    ALT 11 (*)    Total Bilirubin 0.1  (*)    All other components within normal limits  ETHANOL  PROTIME-INR  APTT  DIFFERENTIAL  RAPID URINE DRUG SCREEN, HOSP PERFORMED  URINALYSIS, ROUTINE W REFLEX MICROSCOPIC (NOT AT [facility])  I-STAT CHEM 8, ED  I-STAT TROPOININ, ED  POC URINE PREG, ED    EKG  EKG Interpretation None       Radiology Ct Head Wo Contrast  Result Date: 12/17/2015 CLINICAL DATA:  Right-sided headache for 2 3 months. Off and on left-sided weakness. EXAM: CT HEAD WITHOUT CONTRAST TECHNIQUE: Contiguous axial images were obtained from the base of the skull through the vertex without intravenous  contrast. COMPARISON:  05/05/2010 FINDINGS: Brain: No acute intracranial abnormality. Specifically, no hemorrhage, hydrocephalus, mass lesion, acute infarction, or significant intracranial injury. Vascular: No hyperdense vessel or unexpected calcification. Skull: Normal. Negative for fracture or focal lesion. Sinuses/Orbits: Visualized paranasal sinuses and mastoids clear. Orbital soft tissues unremarkable. Other: None IMPRESSION: No acute intracranial abnormality. Electronically Signed   By: Rolm Baptise M.D.   On: 12/17/2015 09:38    Procedures Procedures (including critical care time)  Medications Ordered in ED Medications  metoCLOPramide (REGLAN) injection 10 mg (10 mg Intravenous Given 12/17/15 0834)  diphenhydrAMINE (BENADRYL) injection 25 mg (25 mg Intravenous Given 12/17/15 0834)     Initial Impression / Assessment and Plan / ED Course  I have reviewed the triage vital signs and the nursing notes.  Pertinent labs & imaging results that were available during my care of the patient were reviewed by me and considered in my medical decision making (see chart for details).  Clinical Course  Comment By Time  Given her numbness will get CT. No weakness. Onset yesterday, not c/w Code stroke. Probably more likely a complicated migraine. Will treat headache, get labs, CT Sherwood Gambler, MD 10/31 [phone]    I  believe the patient is having a complicated migraine. Her headache is now gone after treatment. Her numbness is very minimal according to her. Given this has been coming and going multiple times my suspicion for stroke is low. There is no obvious mass on the CT. I discussed possible MRI imaging but she would rather go see her neurologist and get this as an outpatient if needed. I think this is reasonable. Continue NSAIDs and Fioricet at home for headache. Discussed return precautions.  Final Clinical Impressions(s) / ED Diagnoses   Final diagnoses:  Right-sided headache  Paresthesia of left arm and leg    New Prescriptions New Prescriptions   BUTALBITAL-ACETAMINOPHEN-CAFFEINE (FIORICET, ESGIC) 50-325-40 MG TABLET    Take 1-2 tablets by mouth every 6 (six) hours as needed for headache.     Sherwood Gambler, MD 12/17/15 1050

## 2015-12-17 NOTE — ED Triage Notes (Signed)
Pt states she has been having headaches and went to neuro MD yesterday and was told she had high BP. Pt was told to come to ED but went shopping instead. Pt does not have hx of hypertension.

## 2015-12-17 NOTE — ED Notes (Signed)
Pt unable to urinate at this time.  

## 2015-12-31 ENCOUNTER — Encounter: Payer: Self-pay | Admitting: Sports Medicine

## 2015-12-31 ENCOUNTER — Ambulatory Visit (INDEPENDENT_AMBULATORY_CARE_PROVIDER_SITE_OTHER): Payer: Medicaid Other

## 2015-12-31 ENCOUNTER — Ambulatory Visit (INDEPENDENT_AMBULATORY_CARE_PROVIDER_SITE_OTHER): Payer: Medicaid Other | Admitting: Sports Medicine

## 2015-12-31 DIAGNOSIS — L84 Corns and callosities: Secondary | ICD-10-CM

## 2015-12-31 DIAGNOSIS — M79673 Pain in unspecified foot: Secondary | ICD-10-CM

## 2015-12-31 DIAGNOSIS — M2042 Other hammer toe(s) (acquired), left foot: Secondary | ICD-10-CM | POA: Diagnosis not present

## 2015-12-31 DIAGNOSIS — M2041 Other hammer toe(s) (acquired), right foot: Secondary | ICD-10-CM | POA: Diagnosis not present

## 2015-12-31 DIAGNOSIS — M2040 Other hammer toe(s) (acquired), unspecified foot: Secondary | ICD-10-CM

## 2015-12-31 DIAGNOSIS — M216X9 Other acquired deformities of unspecified foot: Secondary | ICD-10-CM | POA: Diagnosis not present

## 2015-12-31 DIAGNOSIS — Z01818 Encounter for other preprocedural examination: Secondary | ICD-10-CM

## 2015-12-31 NOTE — Patient Instructions (Signed)
Pre-Operative Instructions  Congratulations, you have decided to take an important step to improving your quality of life.  You can be assured that the doctors of [facility] will be with you every step of the way.  1. Plan to be at the surgery center/hospital at least 1 (one) hour prior to your scheduled time unless otherwise directed by the surgical center/hospital staff.  You must have a responsible adult accompany you, remain during the surgery and drive you home.  Make sure you have directions to the surgical center/hospital and know how to get there on time. 2. For hospital based surgery you will need to obtain a history and physical form from your family physician within 1 month prior to the date of surgery- we will give you a form for you primary physician.  3. We make every effort to accommodate the date you request for surgery.  There are however, times where surgery dates or times have to be moved.  We will contact you as soon as possible if a change in schedule is required.   4. No Aspirin/Ibuprofen for one week before surgery.  If you are on aspirin, any non-steroidal anti-inflammatory medications (Mobic, Aleve, Ibuprofen) you should stop taking it 7 days prior to your surgery.  You make take Tylenol  For pain prior to surgery.  5. Medications- If you are taking daily heart and blood pressure medications, seizure, reflux, allergy, asthma, anxiety, pain or diabetes medications, make sure the surgery center/hospital is aware before the day of surgery so they may notify you which medications to take or avoid the day of surgery. 6. No food or drink after midnight the night before surgery unless directed otherwise by surgical center/hospital staff. 7. No alcoholic beverages 24 hours prior to surgery.  No smoking 24 hours prior to or 24 hours after surgery. 8. Wear loose pants or shorts- loose enough to fit over bandages, boots, and casts. 9. No slip on shoes, sneakers are best. 10. Bring  your boot with you to the surgery center/hospital.  Also bring crutches or a walker if your physician has prescribed it for you.  If you do not have this equipment, it will be provided for you after surgery. 11. If you have not been contracted by the surgery center/hospital by the day before your surgery, call to confirm the date and time of your surgery. 12. Leave-time from work may vary depending on the type of surgery you have.  Appropriate arrangements should be made prior to surgery with your employer. 13. Prescriptions will be provided immediately following surgery by your doctor.  Have these filled as soon as possible after surgery and take the medication as directed. 14. Remove nail polish on the operative foot. 15. Wash the night before surgery.  The night before surgery wash the foot and leg well with the antibacterial soap provided and water paying special attention to beneath the toenails and in between the toes.  Rinse thoroughly with water and dry well with a towel.  Perform this wash unless told not to do so by your physician.  Enclosed: 1 Ice pack (please put in freezer the night before surgery)   1 Hibiclens skin cleaner   Pre-op Instructions  If you have any questions regarding the instructions, do not hesitate to call our office.  [place]: [address] [place], [place] [address] [phone]  [place]: [address] [place], [place] [address] [phone]  [place]: [address]  [place], [place] [address] [phone]   Dr.   Norman Regal DPM, Dr. Matthew Wagoner DPM, Dr. M. Todd Hyatt DPM, Dr. Kalecia Hartney DPM 

## 2015-12-31 NOTE — Progress Notes (Signed)
Subjective: Vicki Mack is a 40 y.o. female patient who presents to office for evaluation of bilateral foot pain secondary to callus skin and hammertoes and want to discuss surgery. Patient states trimming helped but callus is back. Patient denies any other pedal complaints.   Patient Active Problem List   Diagnosis Date Noted  . Migraine headache without aura 08/27/2011  . SAB (spontaneous abortion) 08/27/2011    Current Outpatient Prescriptions on File Prior to Visit  Medication Sig Dispense Refill  . acetaminophen (TYLENOL) 325 MG tablet Take 325 mg by mouth every 6 (six) hours as needed for mild pain or headache. Reported on 08/12/2015    . benzonatate (TESSALON) 100 MG capsule Take 1 capsule (100 mg total) by mouth every 8 (eight) hours. (Patient not taking: Reported on 12/17/2015) 21 capsule 0  . butalbital-acetaminophen-caffeine (FIORICET, ESGIC) 50-325-40 MG tablet Take 1-2 tablets by mouth every 6 (six) hours as needed for headache. 20 tablet 0  . fluconazole (DIFLUCAN) 150 MG tablet Take 1 tablet (150 mg total) by mouth every 3 (three) days. (Patient not taking: Reported on 12/17/2015) 2 tablet 0  . lamoTRIgine (LAMICTAL) 25 MG tablet Take 25 mg by mouth daily. At bedtime    . medroxyPROGESTERone (PROVERA) 10 MG tablet TAKE 2 TABLETS (20 MG TOTAL) BY MOUTH DAILY. 60 tablet 0  . mirtazapine (REMERON) 15 MG tablet Take 15 mg by mouth at bedtime.    . misoprostol (CYTOTEC) 200 MCG tablet Take 3 tablets (600 mcg total) by mouth once. (Patient not taking: Reported on 12/17/2015) 3 tablet 0  . Prenatal Vit-Fe Fumarate-FA (PRENATAL MULTIVITAMIN) TABS tablet Take 1 tablet by mouth daily at 12 noon. Reported on 08/12/2015     No current facility-administered medications on file prior to visit.     Allergies  Allergen Reactions  . Codeine Rash  . Penicillins Rash  . Sulfa Antibiotics Rash    Objective:  General: Alert and oriented x3 in no acute distress  Dermatology:  Keratotic lesion present sub met 2 bilateral with skin lines transversing the lesions, pain is present with direct pressure to the lesions with a central nucleated core noted, no webspace macerations, no ecchymosis bilateral, all nails x 10 are well manicured.  Vascular: Dorsalis Pedis and Posterior Tibial pedal pulses 2/4, Capillary Fill Time 3 seconds, + pedal hair growth bilateral, no edema bilateral lower extremities, Temperature gradient within normal limits.  Neurology: Johney Maine sensation intact via light touch bilateral.  Musculoskeletal: Mild tenderness with palpation at the keratotic lesion sites bilateral, Muscular strength 5/5 in all groups without pain or limitation on range of motion. Hammertoe boney deformity noted bilateral with fat pad atrophy and prominent met head.  Xray left and right foot- normal osseous mineralization, hammertoe, plantarflexed 2nd met, no other acute findings   Assessment and Plan: Problem List Items Addressed This Visit    None    Visit Diagnoses    Pain of foot, unspecified laterality    -  Primary   Relevant Orders   DG Foot 2 Views Left   DG Foot 2 Views Right   Callus of foot       Hammer toes of both feet       Prominent metatarsal head, unspecified laterality       Preoperative examination         -Complete examination performed -Discussed treatment options --Patient opt for surgical management. Consent obtained for Left hammertoe repair, exostectomy, excision of keratosis. Pre and Post op course explained.  Risks, benefits, alternatives explained. No guarantees given or implied. Surgical booking slip submitted and provided patient with Surgical packet and info for [facility].  -To dispense CAM Walker at crutches at surgery center to use post op -For now to callus, Encouraged daily skin emollients -Encouraged continue with use of pumice stone -Advised good supportive shoes and inserts -Advised patient to get FMLA paperwork to office to be completed  for maximum time off to recover from surgery of 46month -Patient to return to office after surgery.   TLandis Martins DPM

## 2016-01-01 ENCOUNTER — Telehealth: Payer: Self-pay | Admitting: *Deleted

## 2016-01-01 NOTE — Telephone Encounter (Signed)
"  I'm calling to schedule an appointment for my surgery."

## 2016-01-03 NOTE — Telephone Encounter (Signed)
"  I'm giving you a call back.  Can you give me a call back?"

## 2016-01-03 NOTE — Telephone Encounter (Signed)
I am returning your call.  How can I help you?  "I want to schedule my surgery."  Do you have a date in mind.  "I was going to do it before Christmas but I decided to do it afterwards.  Can we schedule it for January 8th?"  That date is available.  "What time will I need to be there and I have some paperwork that needs to be filled out.  Do I bring it to you?"  You can bring paperwork here for Vicki Mack and someone from the surgical center will call the Friday before with the arrival time.

## 2016-02-04 ENCOUNTER — Telehealth: Payer: Self-pay | Admitting: *Deleted

## 2016-02-04 NOTE — Telephone Encounter (Signed)
"  I had scheduled to have foot surgery on January 8.  I need to reschedule that, could you give me a call back?"

## 2016-02-18 ENCOUNTER — Other Ambulatory Visit: Payer: Self-pay | Admitting: Family Medicine

## 2016-02-18 DIAGNOSIS — Z1231 Encounter for screening mammogram for malignant neoplasm of breast: Secondary | ICD-10-CM

## 2016-02-18 NOTE — Telephone Encounter (Signed)
"  I want to reschedule my surgery from January 8 to May 7.  I want to wait until the cold weather is gone."  I will get it rescheduled.

## 2016-03-23 ENCOUNTER — Ambulatory Visit
Admission: RE | Admit: 2016-03-23 | Discharge: 2016-03-23 | Disposition: A | Discharge status: Home / Self Care | Payer: Medicaid Other | Source: Ambulatory Visit | Attending: Family Medicine | Admitting: Family Medicine

## 2016-03-23 DIAGNOSIS — Z1231 Encounter for screening mammogram for malignant neoplasm of breast: Secondary | ICD-10-CM

## 2016-03-24 ENCOUNTER — Other Ambulatory Visit: Payer: Self-pay | Admitting: Family Medicine

## 2016-03-24 ENCOUNTER — Ambulatory Visit (HOSPITAL_COMMUNITY)
Admission: EM | Admit: 2016-03-24 | Discharge: 2016-03-24 | Disposition: A | Discharge status: Home / Self Care | Payer: Medicaid Other | Attending: Family Medicine | Admitting: Family Medicine

## 2016-03-24 ENCOUNTER — Ambulatory Visit (INDEPENDENT_AMBULATORY_CARE_PROVIDER_SITE_OTHER): Payer: Medicaid Other

## 2016-03-24 ENCOUNTER — Encounter: Payer: Self-pay | Admitting: Emergency Medicine

## 2016-03-24 DIAGNOSIS — R1013 Epigastric pain: Secondary | ICD-10-CM | POA: Diagnosis not present

## 2016-03-24 DIAGNOSIS — N6489 Other specified disorders of breast: Secondary | ICD-10-CM

## 2016-03-24 MED ORDER — OMEPRAZOLE 20 MG PO CPDR: 20.0000 mg | DELAYED_RELEASE_CAPSULE | Freq: Every day | ORAL | 1 refills | Status: DC

## 2016-03-24 MED ORDER — GI COCKTAIL ~~LOC~~
30.0000 mL | Freq: Once | ORAL | Status: AC
  Administered 2016-03-24: 30 mL via ORAL

## 2016-03-24 MED ORDER — GI COCKTAIL ~~LOC~~
ORAL | Status: AC
Start: 2016-03-24 — End: 2016-03-24
  Filled 2016-03-24: qty 30

## 2016-03-24 NOTE — Discharge Instructions (Signed)
If your pain worsens or is not improving in 24 hours, I will you to go directly to the emergency room or back to this department.

## 2016-03-24 NOTE — ED Triage Notes (Signed)
C/o stomach ulcers States she can taste blood in her mouth  States bowel movements are normal Burning pain in stomach

## 2016-03-24 NOTE — ED Provider Notes (Signed)
[facility]    CSN: [id number] Arrival date & time: 03/24/16  1008     History   Chief Complaint Chief Complaint  Patient presents with  . stomach ulcers    HPI Vicki Mack is a 41 y.o. female.   This a 41 year old woman who comes in with progressive epigastric pain over the last 4-5 days. She's had no nausea or vomiting. She also denies diarrhea. She does have quite a bit of bloating and the pain is definitely in the epigastrium. She says that she's been having normal bowel movements and passing gas. She seen no melena or blood per rectum. She's had no fever.  Patient does have an IUD in place and last menstrual period is happening currently.      Past Medical History:  Diagnosis Date  . Abnormal Pap smear    colpo with biopsy at age 38  . Bipolar 1 disorder ([place])   . Infertility    able to become pregnant with Clomid  . Low iron   . Migraine   . Migraine   . PTSD (post-traumatic stress disorder)    seeing neuropsychologist    Patient Active Problem List   Diagnosis Date Noted  . Migraine headache without aura 08/27/2011  . SAB (spontaneous abortion) 08/27/2011    Past Surgical History:  Procedure Laterality Date  . NO PAST SURGERIES      OB History    Gravida Para Term Preterm AB Living   3 1 1   2 1    SAB TAB Ectopic Multiple Live Births   2       1       Home Medications    Prior to Admission medications   Medication Sig Start Date End Date Taking? Authorizing Provider  acetaminophen (TYLENOL) 325 MG tablet Take 325 mg by mouth every 6 (six) hours as needed for mild pain or headache. Reported on 08/12/2015    Historical Provider, MD  benzonatate (TESSALON) 100 MG capsule Take 1 capsule (100 mg total) by mouth every 8 (eight) hours. 10/16/15   Shawn C Joy, PA-C  butalbital-acetaminophen-caffeine (FIORICET, ESGIC) 50-325-40 MG tablet Take 1-2 tablets by mouth every 6 (six) hours as needed for headache. 12/17/15 12/16/16  Sherwood Gambler, MD  fluconazole (DIFLUCAN) 150 MG tablet Take 1 tablet (150 mg total) by mouth every 3 (three) days. 06/13/15   Truett Mainland, DO  lamoTRIgine (LAMICTAL) 25 MG tablet Take 25 mg by mouth daily. At bedtime    Historical Provider, MD  medroxyPROGESTERone (PROVERA) 10 MG tablet TAKE 2 TABLETS (20 MG TOTAL) BY MOUTH DAILY. 10/09/15   Woodroe Mode, MD  mirtazapine (REMERON) 15 MG tablet Take 15 mg by mouth at bedtime.    Historical Provider, MD  misoprostol (CYTOTEC) 200 MCG tablet Take 3 tablets (600 mcg total) by mouth once. 06/13/15   Truett Mainland, DO  omeprazole (PRILOSEC) 20 MG capsule Take 1 capsule (20 mg total) by mouth daily. 03/24/16   Robyn Haber, MD  Prenatal Vit-Fe Fumarate-FA (PRENATAL MULTIVITAMIN) TABS tablet Take 1 tablet by mouth daily at 12 noon. Reported on 08/12/2015    Historical Provider, MD    Family History Family History  Problem Relation Age of Onset  . Diabetes Mother   . Hypertension Mother   . Diabetes Father   . Hypertension Father   . Alzheimer's disease Father   . Other Sister     breast cysts  . Breast cancer Paternal Aunt  had mastectomy  . Alzheimer's disease Paternal Aunt     Social History Social History  Substance Use Topics  . Smoking status: Never Smoker  . Smokeless tobacco: Never Used  . Alcohol use No     Allergies   Codeine; Penicillins; and Sulfa antibiotics   Review of Systems Review of Systems  Constitutional: Negative.   HENT: Negative.   Eyes: Negative.   Respiratory: Negative.   Cardiovascular: Negative.   Gastrointestinal: Positive for abdominal pain. Negative for constipation, diarrhea, nausea and vomiting.  Genitourinary: Negative.   Musculoskeletal: Negative.   Neurological: Negative.   Psychiatric/Behavioral: Negative.      Physical Exam Triage Vital Signs ED Triage Vitals [03/24/16 1041]  Enc Vitals Group     BP 113/69     Pulse Rate 80     Resp 16     Temp 98.1 F (36.7 C)     Temp  Source Oral     SpO2 100 %     Weight      Height      Head Circumference      Peak Flow      Pain Score      Pain Loc      Pain Edu?      Excl. in [place]?    No data found.   Updated Vital Signs BP 113/69 (BP Location: Right Arm)   Pulse 80   Temp 98.1 F (36.7 C) (Oral)   Resp 16   LMP 03/20/2016   SpO2 100%    Physical Exam  Constitutional: She is oriented to person, place, and time. She appears well-developed and well-nourished. She appears distressed.  HENT:  Head: Normocephalic.  Right Ear: External ear normal.  Left Ear: External ear normal.  Mouth/Throat: Oropharynx is clear and moist.  Neck: Normal range of motion. Neck supple.  Pulmonary/Chest: Effort normal.  Abdominal: Soft. She exhibits distension. She exhibits no mass. There is tenderness. There is no rebound and no guarding.  High pitched bowel sounds Patient's holding her epigastrium, and palpation of the epigastrium causes increased pain  Musculoskeletal: Normal range of motion.  Neurological: She is alert and oriented to person, place, and time.  Skin: Skin is warm and dry.  Psychiatric: She has a normal mood and affect. Her behavior is normal.  Nursing note and vitals reviewed.    UC Treatments / Results  Labs (all labs ordered are listed, but only abnormal results are displayed) Labs Reviewed - No data to display  EKG  EKG Interpretation None       Radiology Dg Abd Acute W/chest  Result Date: 03/24/2016 CLINICAL DATA:  Three day history of burning abdominal pain. EXAM: DG ABDOMEN ACUTE W/ 1V CHEST COMPARISON:  Chest x-ray 10/16/2015 FINDINGS: The lungs are clear wiithout focal pneumonia, edema, pneumothorax or pleural effusion. The cardiopericardial silhouette is within normal limits for size. The visualized bony structures of the thorax are intact. Upright film shows no evidence for intraperitoneal free air. There is no evidence for gaseous bowel dilation to suggest obstruction. Visualized  bony anatomy is unremarkable. IUD projects over the upper anatomic pelvis. IMPRESSION: Negative abdominal radiographs.  No acute cardiopulmonary disease. Electronically Signed   By: Misty Stanley M.D.   On: 03/24/2016 11:20   Mm Digital Screening Bilateral  Result Date: 03/23/2016 CLINICAL DATA:  Screening. EXAM: DIGITAL SCREENING BILATERAL MAMMOGRAM WITH CAD COMPARISON:  Previous exam(s). ACR Breast Density Category b: There are scattered areas of fibroglandular density. FINDINGS: In the right  breast, a possible asymmetry warrants further evaluation. In the left breast, no findings suspicious for malignancy. Images were processed with CAD. IMPRESSION: Further evaluation is suggested for possible asymmetry in the right breast. RECOMMENDATION: Diagnostic mammogram and possibly ultrasound of the right breast. (Code:[id number]-R-46M) The patient will be contacted regarding the findings, and additional imaging will be scheduled. BI-RADS CATEGORY  0: Incomplete. Need additional imaging evaluation and/or prior mammograms for comparison. Electronically Signed   By: Margarette Canada M.D.   On: 03/23/2016 13:25    Procedures Procedures (including critical care time)  Medications Ordered in UC Medications  gi cocktail (Maalox,Lidocaine,Donnatal) (30 mLs Oral Given 03/24/16 1107)     Initial Impression / Assessment and Plan / UC Course  I have reviewed the triage vital signs and the nursing notes.  Pertinent labs & imaging results that were available during my care of the patient were reviewed by me and considered in my medical decision making (see chart for details).  Patient did get some relief with the GI cocktail but she still has some cramps.  Final Clinical Impressions(s) / UC Diagnoses   Final diagnoses:  Acute epigastric pain  Patient seems well enough and her exam is not striking enough to want further investigation at this time. I've asked her to come back if she's not improving in next 24 hours  New  Prescriptions New Prescriptions   OMEPRAZOLE (PRILOSEC) 20 MG CAPSULE    Take 1 capsule (20 mg total) by mouth daily.     Robyn Haber, MD 03/24/16 [phone]

## 2016-03-25 ENCOUNTER — Other Ambulatory Visit: Payer: Self-pay | Admitting: Family Medicine

## 2016-03-25 DIAGNOSIS — R928 Other abnormal and inconclusive findings on diagnostic imaging of breast: Secondary | ICD-10-CM

## 2016-03-26 ENCOUNTER — Encounter (HOSPITAL_COMMUNITY): Payer: Self-pay | Admitting: Emergency Medicine

## 2016-03-26 ENCOUNTER — Emergency Department (HOSPITAL_COMMUNITY)
Admission: EM | Admit: 2016-03-26 | Discharge: 2016-03-26 | Disposition: A | Discharge status: Home / Self Care | Payer: Medicaid Other | Attending: Emergency Medicine | Admitting: Emergency Medicine

## 2016-03-26 DIAGNOSIS — R1013 Epigastric pain: Secondary | ICD-10-CM | POA: Diagnosis present

## 2016-03-26 LAB — COMPREHENSIVE METABOLIC PANEL
ALBUMIN: 4 g/dL (ref 3.5–5.0)
ALT: 11 U/L — AB (ref 14–54)
AST: 20 U/L (ref 15–41)
Alkaline Phosphatase: 62 U/L (ref 38–126)
Anion gap: 8 (ref 5–15)
BUN: 8 mg/dL (ref 6–20)
CHLORIDE: 105 mmol/L (ref 101–111)
CO2: 24 mmol/L (ref 22–32)
CREATININE: 0.76 mg/dL (ref 0.44–1.00)
Calcium: 9.1 mg/dL (ref 8.9–10.3)
GFR calc Af Amer: >60 mL/min (ref >60)
GLUCOSE: 82 mg/dL (ref 65–99)
POTASSIUM: 4 mmol/L (ref 3.5–5.1)
SODIUM: 137 mmol/L (ref 135–145)
Total Bilirubin: 0.6 mg/dL (ref 0.3–1.2)
Total Protein: 6.9 g/dL (ref 6.5–8.1)

## 2016-03-26 LAB — URINALYSIS, ROUTINE W REFLEX MICROSCOPIC
Bilirubin Urine: NEGATIVE
GLUCOSE, UA: NEGATIVE mg/dL
HGB URINE DIPSTICK: NEGATIVE
KETONES UR: NEGATIVE mg/dL
Leukocytes, UA: NEGATIVE
Nitrite: NEGATIVE
PH: 5 (ref 5.0–8.0)
PROTEIN: NEGATIVE mg/dL
Specific Gravity, Urine: 1.023 (ref 1.005–1.030)

## 2016-03-26 LAB — CBC
HEMATOCRIT: 36.6 % (ref 36.0–46.0)
Hemoglobin: 11.4 g/dL — ABNORMAL LOW (ref 12.0–15.0)
MCH: 24.4 pg — AB (ref 26.0–34.0)
MCHC: 31.1 g/dL (ref 30.0–36.0)
MCV: 78.4 fL (ref 78.0–100.0)
PLATELETS: 248 10*3/uL (ref 150–400)
RBC: 4.67 MIL/uL (ref 3.87–5.11)
RDW: 17.5 % — AB (ref 11.5–15.5)
WBC: 7.7 10*3/uL (ref 4.0–10.5)

## 2016-03-26 LAB — PREGNANCY, URINE: Preg Test, Ur: NEGATIVE

## 2016-03-26 LAB — I-STAT TROPONIN, ED: Troponin i, poc: 0 ng/mL (ref 0.00–0.08)

## 2016-03-26 LAB — LIPASE, BLOOD: LIPASE: 19 U/L (ref 11–51)

## 2016-03-26 MED ORDER — SUCRALFATE 1 GM/10ML PO SUSP: 1.0000 g | Freq: Three times a day (TID) | ORAL | 0 refills | Status: DC

## 2016-03-26 MED ORDER — GI COCKTAIL ~~LOC~~
30.0000 mL | Freq: Once | ORAL | Status: AC
  Administered 2016-03-26: 30 mL via ORAL
  Filled 2016-03-26: qty 30

## 2016-03-26 NOTE — ED Notes (Signed)
Pt stable, ambulatory, states understanding of discharge instructions 

## 2016-03-26 NOTE — ED Provider Notes (Signed)
Emergency Department Provider Note   I have reviewed the triage vital signs and the nursing notes.   HISTORY  Chief Complaint Abdominal Pain   HPI Vicki Mack is a 41 y.o. female with PMH of bipolar disorder and GERD resents to the emergent department for evaluation of epigastric discomfort for the past week. She describes the pain as a burning sensation in her epigastrium as nonradiating. She sometimes feels that she has some difficulty and nausea with swallowing but is able to tolerate both solids and liquids. No associated vomiting or diarrhea. No fevers. No radiation of pain to the chest or difficulty breathing. She was seen in urgent care and given Prilosec which she has continued taking with little relief.   Past Medical History:  Diagnosis Date  . Abnormal Pap smear    colpo with biopsy at age 87  . Bipolar 1 disorder ([place])   . Infertility    able to become pregnant with Clomid  . Low iron   . Migraine   . Migraine   . PTSD (post-traumatic stress disorder)    seeing neuropsychologist    Patient Active Problem List   Diagnosis Date Noted  . Migraine headache without aura 08/27/2011  . SAB (spontaneous abortion) 08/27/2011    Past Surgical History:  Procedure Laterality Date  . NO PAST SURGERIES      Current Outpatient Rx  . Order #: [id number] Class: Historical Med  . Order #: [id number] Class: Normal  . Order #: [id number] Class: Print    Allergies Codeine; Penicillins; and Sulfa antibiotics  Family History  Problem Relation Age of Onset  . Diabetes Mother   . Hypertension Mother   . Diabetes Father   . Hypertension Father   . Alzheimer's disease Father   . Other Sister     breast cysts  . Breast cancer Paternal Aunt     had mastectomy  . Alzheimer's disease Paternal Aunt     Social History Social History  Substance Use Topics  . Smoking status: Never Smoker  . Smokeless tobacco: Never Used  . Alcohol use No    Review of  Systems  Constitutional: No fever/chills Eyes: No visual changes. ENT: No sore throat. Cardiovascular: Denies chest pain. Respiratory: Denies shortness of breath. Gastrointestinal: Positive epigastric abdominal pain. Positive nausea, no vomiting.  No diarrhea.  No constipation. Genitourinary: Negative for dysuria. Musculoskeletal: Negative for back pain. Skin: Negative for rash. Neurological: Negative for headaches, focal weakness or numbness.  10-point ROS otherwise negative.  ____________________________________________   PHYSICAL EXAM:  VITAL SIGNS: ED Triage Vitals  Enc Vitals Group     BP 03/26/16 1326 127/83     Pulse Rate 03/26/16 1326 78     Resp 03/26/16 1326 16     Temp 03/26/16 1326 98 F (36.7 C)     Temp Source 03/26/16 1326 Oral     SpO2 03/26/16 1326 100 %     Pain Score 03/26/16 1323 10   Constitutional: Alert and oriented. Well appearing and in no acute distress. Eyes: Conjunctivae are normal. Head: Atraumatic. Nose: No congestion/rhinnorhea. Mouth/Throat: Mucous membranes are moist.  Neck: No stridor.   Cardiovascular: Normal rate, regular rhythm. Good peripheral circulation. Grossly normal heart sounds.   Respiratory: Normal respiratory effort.  No retractions. Lungs CTAB. Gastrointestinal: Soft with mild epigastric tenderness. No rebound or guarding. No distention.  Musculoskeletal: No lower extremity tenderness nor edema. No gross deformities of extremities. Neurologic:  Normal speech and language. No gross focal neurologic deficits are  appreciated.  Skin:  Skin is warm, dry and intact. No rash noted. Psychiatric: Mood and affect are normal. Speech and behavior are normal.  ____________________________________________   LABS (all labs ordered are listed, but only abnormal results are displayed)  Labs Reviewed  COMPREHENSIVE METABOLIC PANEL - Abnormal; Notable for the following:       Result Value   ALT 11 (*)    All other components within  normal limits  CBC - Abnormal; Notable for the following:    Hemoglobin 11.4 (*)    MCH 24.4 (*)    RDW 17.5 (*)    All other components within normal limits  LIPASE, BLOOD  URINALYSIS, ROUTINE W REFLEX MICROSCOPIC  PREGNANCY, URINE  I-STAT TROPOININ, ED   ____________________________________________  EKG   EKG Interpretation  Date/Time:  Thursday March 26 2016 17:13:38 EST Ventricular Rate:  76 PR Interval:    QRS Duration: 94 QT Interval:  403 QTC Calculation: 454 R Axis:   73 Text Interpretation:  Sinus rhythm No STEMI.  Confirmed by Yasira Engelson MD, Favio Moder [phone]) on 03/26/2016 5:49:07 PM       ____________________________________________  RADIOLOGY  None ____________________________________________   PROCEDURES  Procedure(s) performed:   Procedures  None ____________________________________________   INITIAL IMPRESSION / ASSESSMENT AND PLAN / ED COURSE  Pertinent labs & imaging results that were available during my care of the patient were reviewed by me and considered in my medical decision making (see chart for details).  Patient presents to the ED for evaluation of epigastric abdominal pain that is burning in quality and non-radiating. Has a history of GERD. Low suspicion for atypical ACS. Pain improved slightly with GI cocktail. Advised that she continue PPI and added Carafate for acute symptoms. Will follow with PCP and discuss GI referral for upper endoscopy.   At this time, I do not feel there is any life-threatening condition present. I have reviewed and discussed all results (EKG, imaging, lab, urine as appropriate), exam findings with patient. I have reviewed nursing notes and appropriate previous records.  I feel the patient is safe to be discharged home without further emergent workup. Discussed usual and customary return precautions. Patient and family (if present) verbalize understanding and are comfortable with this plan.  Patient will follow-up with  their primary care provider. If they do not have a primary care provider, information for follow-up has been provided to them. All questions have been answered.  ____________________________________________  FINAL CLINICAL IMPRESSION(S) / ED DIAGNOSES  Final diagnoses:  Epigastric pain     MEDICATIONS GIVEN DURING THIS VISIT:  Medications  gi cocktail (Maalox,Lidocaine,Donnatal) (30 mLs Oral Given 03/26/16 1709)     NEW OUTPATIENT MEDICATIONS STARTED DURING THIS VISIT:  Discharge Medication List as of 03/26/2016  5:50 PM    START taking these medications   Details  sucralfate (CARAFATE) 1 GM/10ML suspension Take 10 mLs (1 g total) by mouth 4 (four) times daily -  with meals and at bedtime., Starting Thu 03/26/2016, Print         Note:  This document was prepared using Dragon voice recognition software and may include unintentional dictation errors.  Nanda Quinton, MD Emergency Medicine   Margette Fast, MD 03/27/16 [phone]

## 2016-03-26 NOTE — Discharge Instructions (Signed)

## 2016-03-26 NOTE — ED Notes (Signed)
Handed out warm blankets and offered explanations for delays in lobby.   

## 2016-03-26 NOTE — ED Triage Notes (Addendum)
Dx with gastririts on 2/6 pt states stomach still hurting, states has been taking her meds  Denies vag d/c or dysuria

## 2016-03-31 ENCOUNTER — Ambulatory Visit
Admission: RE | Admit: 2016-03-31 | Discharge: 2016-03-31 | Disposition: A | Discharge status: Home / Self Care | Payer: Medicaid Other | Source: Ambulatory Visit | Attending: Family Medicine | Admitting: Family Medicine

## 2016-03-31 DIAGNOSIS — N6489 Other specified disorders of breast: Secondary | ICD-10-CM

## 2016-05-05 ENCOUNTER — Other Ambulatory Visit: Payer: Self-pay | Admitting: Family Medicine

## 2016-05-11 DIAGNOSIS — R52 Pain, unspecified: Secondary | ICD-10-CM

## 2016-06-15 ENCOUNTER — Telehealth: Payer: Self-pay | Admitting: Sports Medicine

## 2016-06-15 NOTE — Telephone Encounter (Signed)
Dr. Cannon Kettle pt would like to you to send the RX, that she was suppose to take before her surgery over to [facility] on [address]. She is scheduled for surgery 5/7.

## 2016-06-15 NOTE — Telephone Encounter (Signed)
I informed pt that she would have at least 4-6 hours of anesthesia after the surgery that would allow her to get home and settled in while a friend picked up the pain medications. Pt states understanding.

## 2016-06-15 NOTE — Telephone Encounter (Signed)
Thanks

## 2016-06-25 ENCOUNTER — Telehealth: Payer: Self-pay | Admitting: *Deleted

## 2016-06-25 NOTE — Telephone Encounter (Addendum)
"  I'd like to reschedule my surgery to June 10.  Please give me a call back."  I called and left her a message that I rescheduled surgery from 07/06/2016 to 07/27/2016.

## 2016-06-30 ENCOUNTER — Encounter: Payer: Medicaid Other | Admitting: Sports Medicine

## 2016-07-02 ENCOUNTER — Telehealth: Payer: Self-pay | Admitting: *Deleted

## 2016-07-02 NOTE — Telephone Encounter (Signed)
"  Could you give me a call back?"

## 2016-07-07 ENCOUNTER — Encounter: Payer: Medicaid Other | Admitting: Sports Medicine

## 2016-07-07 NOTE — Telephone Encounter (Signed)
I'm returning your call.  How can I help you.  "You had already helped me.  I had called about the note for jury duty.  It's the same day as my surgery.  I left a message for the lady to give me a call back.  She's supposed to be in tomorrow."

## 2016-07-08 ENCOUNTER — Telehealth: Payer: Self-pay | Admitting: Sports Medicine

## 2016-07-08 ENCOUNTER — Encounter: Payer: Self-pay | Admitting: Sports Medicine

## 2016-07-27 ENCOUNTER — Encounter: Payer: Self-pay | Admitting: Sports Medicine

## 2016-07-27 DIAGNOSIS — M2042 Other hammer toe(s) (acquired), left foot: Secondary | ICD-10-CM | POA: Diagnosis not present

## 2016-07-27 DIAGNOSIS — D492 Neoplasm of unspecified behavior of bone, soft tissue, and skin: Secondary | ICD-10-CM | POA: Diagnosis not present

## 2016-07-27 DIAGNOSIS — M21542 Acquired clubfoot, left foot: Secondary | ICD-10-CM | POA: Diagnosis not present

## 2016-07-28 ENCOUNTER — Telehealth: Payer: Self-pay | Admitting: Sports Medicine

## 2016-07-28 NOTE — Telephone Encounter (Signed)
Pt said pain med was making her sick. She didn't get the nausea med filled, shes going to get that filled and try it. I told her to call back if it does not help.

## 2016-07-28 NOTE — Telephone Encounter (Signed)
Post op check phone call, patient reports that she is doing ok and denies any pain in her Left foot; states that her foot and leg still feels numb after the block and she hasn't taken anything. I advised patient to take Tylenol or Motrin and if needed her pain medication as Rx to prevent an episode of excruciating pain; patient expressed understanding. I encouraged patient to continue with rest, ice, elevation, and no weight to left forefoot and to use crutches. Patient to follow up as scheduled next week. -Dr. Cannon Kettle

## 2016-08-04 ENCOUNTER — Ambulatory Visit (INDEPENDENT_AMBULATORY_CARE_PROVIDER_SITE_OTHER): Payer: Medicaid Other

## 2016-08-04 ENCOUNTER — Ambulatory Visit (INDEPENDENT_AMBULATORY_CARE_PROVIDER_SITE_OTHER): Payer: Self-pay | Admitting: Sports Medicine

## 2016-08-04 ENCOUNTER — Encounter: Payer: Self-pay | Admitting: Sports Medicine

## 2016-08-04 VITALS — BP 149/104 | Temp 96.4°F

## 2016-08-04 DIAGNOSIS — M216X9 Other acquired deformities of unspecified foot: Secondary | ICD-10-CM

## 2016-08-04 DIAGNOSIS — M2042 Other hammer toe(s) (acquired), left foot: Secondary | ICD-10-CM

## 2016-08-04 DIAGNOSIS — L84 Corns and callosities: Secondary | ICD-10-CM

## 2016-08-04 DIAGNOSIS — R110 Nausea: Secondary | ICD-10-CM

## 2016-08-04 DIAGNOSIS — Z9889 Other specified postprocedural states: Secondary | ICD-10-CM

## 2016-08-04 MED ORDER — ONDANSETRON HCL 4 MG PO TABS: 4.0000 mg | ORAL_TABLET | Freq: Three times a day (TID) | ORAL | 0 refills | Status: DC | PRN

## 2016-08-04 MED ORDER — TRAMADOL HCL 50 MG PO TABS: 50.0000 mg | ORAL_TABLET | Freq: Three times a day (TID) | ORAL | 0 refills | Status: DC | PRN

## 2016-08-04 NOTE — Progress Notes (Signed)
Subjective: Vicki Mack is a 41 y.o. female patient seen today in office for POV #1  (DOS 07-27-16), S/P Left met head resection, hammertoe repair 2-5, removal of callus sub met 2. Patient denies pain at surgical site, denies calf pain, denies headache, chest pain, shortness of breath, vomiting, fever, or chills. Admits nausea and difficult with Norco. No other issues noted.   Patient Active Problem List   Diagnosis Date Noted  . Migraine headache without aura 08/27/2011  . SAB (spontaneous abortion) 08/27/2011    Current Outpatient Prescriptions on File Prior to Visit  Medication Sig Dispense Refill  . Levonorgestrel (LILETTA, 52 MG, IU) 1 application by Intrauterine route once. Since June 2017    . omeprazole (PRILOSEC) 20 MG capsule Take 1 capsule (20 mg total) by mouth daily. 20 capsule 1  . sucralfate (CARAFATE) 1 GM/10ML suspension Take 10 mLs (1 g total) by mouth 4 (four) times daily -  with meals and at bedtime. 420 mL 0   No current facility-administered medications on file prior to visit.     Allergies  Allergen Reactions  . Codeine Rash  . Penicillins Rash  . Sulfa Antibiotics Rash    Objective: There were no vitals filed for this visit.  General: No acute distress, AAOx3  Left foot: Kwire at 2nd toe and Sutures intact with no gapping or dehiscence at surgical site, mild swelling to left foot, no erythema, no warmth, no drainage, no signs of infection noted, Capillary fill time <3 seconds in all digits, gross sensation present via light touch to left foot. No pain or crepitation with range of motion  left foot.  No pain with calf compression.   Post Op Xray, Left foot: Kwire in excellent alignment and position. Ostectomy sites healing. Soft tissue swelling within normal limits for post op status.   Assessment and Plan:  Problem List Items Addressed This Visit    None    Visit Diagnoses    Hammertoe of left foot    -  Primary   Relevant Orders   DG Foot  Complete Left (Completed)   Prominent metatarsal head, unspecified laterality       Callus of foot       S/P foot surgery, left       Relevant Medications   traMADol (ULTRAM) 50 MG tablet   ondansetron (ZOFRAN) 4 MG tablet   Nausea       Relevant Medications   ondansetron (ZOFRAN) 4 MG tablet     -Patient seen and evaluated -Xrays reviewed -Applied dry sterile dressing to surgical site left foot secured with ACE wrap and stockinet  -Advised patient to make sure to keep dressings clean, dry, and intact to left surgical site, removing the ACE as needed  -Advised patient to continue with CAM boot and crutches -Continue with PRN meds -Rx Tramadol for pain and Zofran for nausea  -Advised patient to limit activity to necessity only up for bathroom and food  -Advised patient to ice and elevate as necessary  -Will plan for suture removal at next office visit dorsallyat at toes, may leave plantar sutures in longer and continue with nonweightbearing with crutches and cam boot. In the meantime, patient to call office if any issues or problems arise.   Landis Martins, DPM

## 2016-08-07 NOTE — Progress Notes (Signed)
DOS 06.11.2018 Left hammertoe repair 2-5, shave down or remove prominent bone at ball of left foot at area of callus.

## 2016-08-11 ENCOUNTER — Ambulatory Visit (INDEPENDENT_AMBULATORY_CARE_PROVIDER_SITE_OTHER): Payer: Medicaid Other

## 2016-08-11 DIAGNOSIS — M216X9 Other acquired deformities of unspecified foot: Secondary | ICD-10-CM

## 2016-08-11 DIAGNOSIS — M2042 Other hammer toe(s) (acquired), left foot: Secondary | ICD-10-CM

## 2016-08-11 NOTE — Progress Notes (Signed)
Vicki Mack is a 41 y.o. female patient seen today in office for POV #1  (DOS 07-27-16), S/P Left met head resection, hammertoe repair 2-5, removal of callus sub met 2. She denies fever chills or nausea. She does complain of itching and mild post operative pain  Partial sutures removed from 2,3 toes, all sutures removed from 4,5 toes, plantar sutures left in place.   Noted well healing post operative incisions. Pin in 2nd met in place. No drainage, no redness no warmth. Swelling WNL for post operative state.   Wrapped toes with coban and dry sterile dressing, ace bandage applied. Advised to continue with ambulatory activities advised to her from Dr Cannon Kettle. Advised on keeping foot dry and clean, compression and elevation.  Re-appointed in 1 week to see Dr Cannon Kettle, she is to call with any acute symptom changes.

## 2016-08-21 ENCOUNTER — Ambulatory Visit (INDEPENDENT_AMBULATORY_CARE_PROVIDER_SITE_OTHER): Payer: Medicaid Other

## 2016-08-21 ENCOUNTER — Ambulatory Visit (INDEPENDENT_AMBULATORY_CARE_PROVIDER_SITE_OTHER): Payer: Self-pay | Admitting: Podiatry

## 2016-08-21 DIAGNOSIS — M216X9 Other acquired deformities of unspecified foot: Secondary | ICD-10-CM

## 2016-08-21 DIAGNOSIS — M2042 Other hammer toe(s) (acquired), left foot: Secondary | ICD-10-CM

## 2016-08-24 NOTE — Progress Notes (Signed)
Subjective:    Patient ID: Vicki Mack, female   DOB: 41 y.o.   MRN: [id number]   HPI patient presents stating that she's doing well with minimal discomfort and is still wearing her boot    ROS      Objective:  Physical Exam neurovascular status intact with patient having pin in place second digit left wound edges well coapted good alignment and good plantar incision site     Assessment:   Doing well post surgery      Plan:   Sterile dressing reapplied reviewed x-rays and patient will continue with immobilization and will be seen back 2 weeks or earlier if needed  X-rays indicate that the pin is in place there is been a metatarsal head resection and good digital alignment of the remaining toes is present

## 2016-09-01 ENCOUNTER — Ambulatory Visit (INDEPENDENT_AMBULATORY_CARE_PROVIDER_SITE_OTHER): Payer: Self-pay | Admitting: Sports Medicine

## 2016-09-01 ENCOUNTER — Ambulatory Visit (INDEPENDENT_AMBULATORY_CARE_PROVIDER_SITE_OTHER): Payer: Medicaid Other

## 2016-09-01 DIAGNOSIS — M2040 Other hammer toe(s) (acquired), unspecified foot: Secondary | ICD-10-CM

## 2016-09-01 DIAGNOSIS — Z9889 Other specified postprocedural states: Secondary | ICD-10-CM

## 2016-09-01 DIAGNOSIS — M216X9 Other acquired deformities of unspecified foot: Secondary | ICD-10-CM

## 2016-09-01 DIAGNOSIS — M2042 Other hammer toe(s) (acquired), left foot: Secondary | ICD-10-CM

## 2016-09-01 NOTE — Progress Notes (Signed)
Subjective: Vicki Mack is a 41 y.o. female patient seen today in office for POV #4  (DOS 07-27-16), S/P Left met head resection, hammertoe repair 2-5, removal of callus sub met 2. Patient denies pain at surgical site, denies calf pain, denies headache, chest pain, shortness of breath, vomiting, fever, or chills. No other issues noted.   Patient Active Problem List   Diagnosis Date Noted  . Migraine headache without aura 08/27/2011  . SAB (spontaneous abortion) 08/27/2011    Current Outpatient Prescriptions on File Prior to Visit  Medication Sig Dispense Refill  . docusate sodium (COLACE) 100 MG capsule Take 100 mg by mouth 2 (two) times daily as needed for mild constipation (Take 1 tablet by mouth every twelve hours as needed for constipation).    Marland Kitchen HYDROmorphone (DILAUDID) 4 MG tablet Take 4 mg by mouth every 6 (six) hours as needed for severe pain (Take 1 tablet by mouth every six hours as needed for pain).    . Levonorgestrel (LILETTA, 52 MG, IU) 1 application by Intrauterine route once. Since June 2017    . omeprazole (PRILOSEC) 20 MG capsule Take 1 capsule (20 mg total) by mouth daily. 20 capsule 1  . ondansetron (ZOFRAN) 4 MG tablet Take 1 tablet (4 mg total) by mouth every 8 (eight) hours as needed for nausea or vomiting. 20 tablet 0  . promethazine (PHENERGAN) 25 MG tablet Take 25 mg by mouth every 8 (eight) hours as needed for nausea or vomiting (Take 1 tablet by mouth every eight hours as needed for nausea).    . sucralfate (CARAFATE) 1 GM/10ML suspension Take 10 mLs (1 g total) by mouth 4 (four) times daily -  with meals and at bedtime. 420 mL 0  . traMADol (ULTRAM) 50 MG tablet Take 1 tablet (50 mg total) by mouth every 8 (eight) hours as needed. 28 tablet 0   No current facility-administered medications on file prior to visit.     Allergies  Allergen Reactions  . Codeine Rash  . Penicillins Rash  . Sulfa Antibiotics Rash    Objective: There were no vitals filed  for this visit.  General: No acute distress, AAOx3  Left foot: Kwire at 2nd toe, incisions well healed with no gapping or dehiscence at surgical site, mild swelling to left foot, no erythema, no warmth, no drainage, no signs of infection noted, Capillary fill time <3 seconds in all digits, gross sensation present via light touch to left foot. No pain or crepitation with range of motion  left foot.  No pain with calf compression.   Post Op Xray, Left foot: Kwire in excellent alignment and position. Ostectomy sites healing. Soft tissue swelling within normal limits for post op status.   Assessment and Plan:  Problem List Items Addressed This Visit    None    Visit Diagnoses    Hammertoe of left foot    -  Primary   Relevant Orders   DG Foot Complete Left   S/P foot surgery, left       Prominent metatarsal head, unspecified laterality         -Patient seen and evaluated -Xrays reviewed -Kwire removed and advised patient that she may shower as normal allowing steri-strips to fall off -Patient declined post op shoe because of $ and opt to use open toed sandal instead; patient to transition out of cam boot to open toed sandal   -Will plan for surgical consult for right hammertoe surgery at next visit  if left is continuing to do well. In the meantime, patient to call office if any issues or problems arise.   Landis Martins, DPM

## 2016-09-15 ENCOUNTER — Encounter: Payer: Medicaid Other | Admitting: Sports Medicine

## 2016-09-16 NOTE — Progress Notes (Signed)
Patient cancelled appt -Dr. Chauncey Cruel  This encounter was created in error - please disregard.

## 2016-10-06 ENCOUNTER — Ambulatory Visit: Payer: Medicaid Other | Admitting: Sports Medicine

## 2016-10-16 ENCOUNTER — Ambulatory Visit (INDEPENDENT_AMBULATORY_CARE_PROVIDER_SITE_OTHER): Payer: Medicaid Other | Admitting: Family Medicine

## 2016-10-16 ENCOUNTER — Encounter: Payer: Self-pay | Admitting: Family Medicine

## 2016-10-16 VITALS — BP 104/70 | HR 77 | Temp 98.9°F | Wt 116.0 lb

## 2016-10-16 DIAGNOSIS — Z7689 Persons encountering health services in other specified circumstances: Secondary | ICD-10-CM

## 2016-10-16 DIAGNOSIS — K5904 Chronic idiopathic constipation: Secondary | ICD-10-CM

## 2016-10-16 MED ORDER — POLYETHYLENE GLYCOL 3350 17 GM/SCOOP PO POWD: 17.0000 g | Freq: Two times a day (BID) | ORAL | 1 refills | Status: DC | PRN

## 2016-10-16 NOTE — Progress Notes (Signed)
   Subjective:    Patient ID: Vicki Mack, female    DOB: [birth year], 41 y.o.   MRN: [id number]   CC: Establishing care new PCP  HPI: Patient is 41 yo female with a past medical history significant for bipolar 1  disorder, migraines, PTSD who presents today to establish care with PCP. Patient reports that she has been doing well. She reports intermittent constipation with some bleeding at times with straining. Patient also eating a lot of ic and has had low iron in the past. Patient has been eating a lot of vegetables, fruits and drinking an herbal tea and plenty of water to help her with her constipation. Patient is seeing a therapist at [facility] on [address]. Patient sees Dr. Nehemiah Settle for her women health check up. Patient currently denies any chest pain, SOB, dizziness, headaches, abdominal pain.   Smoking status reviewed  Review of Systems  Constitutional: Negative.   HENT: Negative.   Eyes: Negative.   Respiratory: Negative.   Cardiovascular: Negative.   Gastrointestinal: Negative.   Genitourinary: Negative.   Musculoskeletal: Negative.   Skin: Negative.   Neurological: Negative.   Endo/Heme/Allergies: Negative.   Psychiatric/Behavioral: Negative.     Past Medical History:  Diagnosis Date  . Abnormal Pap smear    colpo with biopsy at age 29  . Bipolar 1 disorder ([place])   . Infertility    able to become pregnant with Clomid  . Low iron   . Migraine   . Migraine   . PTSD (post-traumatic stress disorder)    seeing neuropsychologist    Past Surgical History:  Procedure Laterality Date  . NO PAST SURGERIES       Objective:  BP 104/70   Pulse 77   Temp 98.9 F (37.2 C) (Oral)   Wt 116 lb (52.6 kg)   LMP 10/11/2016   SpO2 98%   BMI 18.17 kg/m   Vitals and nursing note reviewed  General: NAD, pleasant, able to participate in exam Cardiac: RRR, normal heart sounds, no murmurs. 2+ radial and PT pulses bilaterally Respiratory: CTAB,  normal effort, No wheezes, rales or rhonchi Abdomen: soft, nontender, nondistended, no hepatic or splenomegaly, +BS Extremities: no edema or cyanosis. WWP. Skin: warm and dry, no rashes noted Neuro: alert and oriented x4, no focal deficits Psych: Normal affect and mood   Assessment & Plan:   #New patient establishing care with PCP Patient is a 41 yo female establishing care today. Patient with no acute concerns today except for some chronic constipation. Patient has been taking her medication for GERD and denies any issues. Given intermittent rectal bleeding secondary to constipation and ice eating consistent with PICA will check CBC and iron panel. Will also check, liver, kidney and thyroid function. Patient is not due for a PAP smear, last one was in 2015 was normal. Patient will continue to follow up with therapist. --Order CBC, CMP, TSH, lipid panel and Iron panel. --Will follow up with lab results --Will see patient in 3 months or sooner as needed.   Marjie Skiff, MD [place] PGY-2

## 2016-10-16 NOTE — Patient Instructions (Addendum)
It was great seeing you today! We have addressed the following issues today   1. I will check your hemoglobin, Thyroid, kidney and liver function. I will also check your cholesterol.  2. Please make sure you follow up with your therapist.  If we did any lab work today, and the results require attention, either me or my nurse will get in touch with you. If everything is normal, you will get a letter in mail and a message via . If you don't hear from [place] in two weeks, please give [place] a call. Otherwise, we look forward to seeing you again at your next visit. If you have any questions or concerns before then, please call the clinic at [phone].  Please bring all your medications to every doctors visit  Sign up for My Chart to have easy access to your labs results, and communication with your Primary care physician. Please ask Front Desk for some assistance.   Please check-out at the front desk before leaving the clinic.    Take Care,   Dr. Andy Gauss

## 2016-10-17 LAB — TSH: TSH: 1.34 u[IU]/mL (ref 0.450–4.500)

## 2016-10-17 LAB — LIPID PANEL
CHOLESTEROL TOTAL: 201 mg/dL — AB (ref 100–199)
Chol/HDL Ratio: 3.1 ratio (ref 0.0–4.4)
HDL: 64 mg/dL (ref >39)
LDL Calculated: 118 mg/dL — ABNORMAL HIGH (ref 0–99)
TRIGLYCERIDES: 96 mg/dL (ref 0–149)
VLDL CHOLESTEROL CAL: 19 mg/dL (ref 5–40)

## 2016-10-17 LAB — CBC WITH DIFFERENTIAL/PLATELET
BASOS: 1 %
Basophils Absolute: 0.1 10*3/uL (ref 0.0–0.2)
EOS (ABSOLUTE): 0.3 10*3/uL (ref 0.0–0.4)
Eos: 4 %
Hematocrit: 40.2 % (ref 34.0–46.6)
Hemoglobin: 12.7 g/dL (ref 11.1–15.9)
Immature Grans (Abs): 0 10*3/uL (ref 0.0–0.1)
Immature Granulocytes: 0 %
Lymphocytes Absolute: 2.2 10*3/uL (ref 0.7–3.1)
Lymphs: 31 %
MCH: 25.7 pg — AB (ref 26.6–33.0)
MCHC: 31.6 g/dL (ref 31.5–35.7)
MCV: 81 fL (ref 79–97)
Monocytes Absolute: 0.6 10*3/uL (ref 0.1–0.9)
Monocytes: 8 %
NEUTROS ABS: 3.9 10*3/uL (ref 1.4–7.0)
Neutrophils: 56 %
Platelets: 273 10*3/uL (ref 150–379)
RBC: 4.95 x10E6/uL (ref 3.77–5.28)
RDW: 17.7 % — ABNORMAL HIGH (ref 12.3–15.4)
WBC: 7 10*3/uL (ref 3.4–10.8)

## 2016-10-17 LAB — CMP14+EGFR
A/G RATIO: 1.5 (ref 1.2–2.2)
ALBUMIN: 4.3 g/dL (ref 3.5–5.5)
ALT: 9 IU/L (ref 0–32)
AST: 22 IU/L (ref 0–40)
Alkaline Phosphatase: 78 IU/L (ref 39–117)
BILIRUBIN TOTAL: 0.3 mg/dL (ref 0.0–1.2)
BUN / CREAT RATIO: 13 (ref 9–23)
BUN: 10 mg/dL (ref 6–24)
CO2: 26 mmol/L (ref 20–29)
Calcium: 9.1 mg/dL (ref 8.7–10.2)
Chloride: 102 mmol/L (ref 96–106)
Creatinine, Ser: 0.79 mg/dL (ref 0.57–1.00)
GFR calc non Af Amer: 92 mL/min/{1.73_m2} (ref >59)
GFR, EST AFRICAN AMERICAN: 106 mL/min/{1.73_m2} (ref >59)
GLOBULIN, TOTAL: 2.8 g/dL (ref 1.5–4.5)
Glucose: 83 mg/dL (ref 65–99)
POTASSIUM: 4.4 mmol/L (ref 3.5–5.2)
SODIUM: 139 mmol/L (ref 134–144)
TOTAL PROTEIN: 7.1 g/dL (ref 6.0–8.5)

## 2016-10-17 LAB — IRON AND TIBC
Iron Saturation: 11 % — ABNORMAL LOW (ref 15–55)
Iron: 42 ug/dL (ref 27–159)
Total Iron Binding Capacity: 390 ug/dL (ref 250–450)
UIBC: 348 ug/dL (ref 131–425)

## 2016-11-04 NOTE — Telephone Encounter (Signed)
No additional notes

## 2016-11-10 ENCOUNTER — Ambulatory Visit (INDEPENDENT_AMBULATORY_CARE_PROVIDER_SITE_OTHER): Payer: Medicaid Other | Admitting: Sports Medicine

## 2016-11-10 ENCOUNTER — Encounter: Payer: Self-pay | Admitting: Sports Medicine

## 2016-11-10 DIAGNOSIS — L84 Corns and callosities: Secondary | ICD-10-CM

## 2016-11-10 DIAGNOSIS — M2041 Other hammer toe(s) (acquired), right foot: Secondary | ICD-10-CM

## 2016-11-10 DIAGNOSIS — M216X9 Other acquired deformities of unspecified foot: Secondary | ICD-10-CM

## 2016-11-10 DIAGNOSIS — M79671 Pain in right foot: Secondary | ICD-10-CM | POA: Diagnosis not present

## 2016-11-10 NOTE — Progress Notes (Signed)
Subjective: Vicki Mack is a 41 y.o. female patient seen today in office for POV #5  (DOS 07-27-16), S/P Left met head resection, hammertoe repair 2-5, removal of callus sub met 2. Patient denies pain at surgical site, states that the area is doing good. At 2nd toe sometimes at the tip it feels weird, denies calf pain, denies headache, chest pain, shortness of breath, vomiting, fever, or chills. Patient desires now to start the planning process to have right foot done. No other issues noted.   Reports that her father has passed away from metastatic cancer.   Patient Active Problem List   Diagnosis Date Noted  . Migraine headache without aura 08/27/2011  . SAB (spontaneous abortion) 08/27/2011    Current Outpatient Prescriptions on File Prior to Visit  Medication Sig Dispense Refill  . docusate sodium (COLACE) 100 MG capsule Take 100 mg by mouth 2 (two) times daily as needed for mild constipation (Take 1 tablet by mouth every twelve hours as needed for constipation).    . Levonorgestrel (LILETTA, 52 MG, IU) 1 application by Intrauterine route once. Since June 2017    . omeprazole (PRILOSEC) 20 MG capsule Take 1 capsule (20 mg total) by mouth daily. 20 capsule 1  . ondansetron (ZOFRAN) 4 MG tablet Take 1 tablet (4 mg total) by mouth every 8 (eight) hours as needed for nausea or vomiting. 20 tablet 0  . polyethylene glycol powder (GLYCOLAX/MIRALAX) powder Take 17 g by mouth 2 (two) times daily as needed. 3350 g 1  . traMADol (ULTRAM) 50 MG tablet Take 1 tablet (50 mg total) by mouth every 8 (eight) hours as needed. 28 tablet 0   No current facility-administered medications on file prior to visit.     Allergies  Allergen Reactions  . Dilaudid [Hydromorphone Hcl]     Upset stomach  . Phenergan [Promethazine Hcl] Diarrhea  . Codeine Rash  . Penicillins Rash  . Sulfa Antibiotics Rash    Objective: There were no vitals filed for this visit.  General: No acute distress, AAOx3   Left foot: Incisions well healed, Capillary fill time <3 seconds in all digits, gross sensation present via light touch to left foot. No pain or crepitation with range of motion left foot.  No pain with calf compression.   Right foot: Callus sub met 2, no signs of infection, neurovascular intact, mild hammertoe deformity. No pain with calf compression.   Assessment and Plan:  Problem List Items Addressed This Visit    None    Visit Diagnoses    Hammertoe of right foot    -  Primary   Prominent metatarsal head, unspecified laterality       Callus of foot       Right foot pain         -Patient seen and evaluated -Left foot well healed -Previous right foot xrays reviewed -Patient opt for surgical management. Consent obtained for Right 2-5 hammertoe repair with possible kwire at 2nd toe and excision of callus and removal of prominent met head. Pre and Post op course explained. Risks, benefits, alternatives explained. No guarantees given or implied. Surgical booking slip submitted and provided patient with Surgical packet and info for [facility]. -Patient to bring CAM Walker and crutches to use post op   -Patient to follow up after surgery. In the meantime, patient to call office if any issues or problems arise.   Titorya Stover, DPM 

## 2016-11-10 NOTE — Patient Instructions (Signed)
Pre-Operative Instructions  Congratulations, you have decided to take an important step towards improving your quality of life.  You can be assured that the doctors and staff at [facility] will be with you every step of the way.  Here are some important things you should know:  1. Plan to be at the surgery center/hospital at least 1 (one) hour prior to your scheduled time, unless otherwise directed by the surgical center/hospital staff.  You must have a responsible adult accompany you, remain during the surgery and drive you home.  Make sure you have directions to the surgical center/hospital to ensure you arrive on time. 2. If you are having surgery at [facility] or [facility] hospitals, you will need a copy of your medical history and physical form from your family physician within one month prior to the date of surgery. We will give you a form for your primary physician to complete.  3. We make every effort to accommodate the date you request for surgery.  However, there are times where surgery dates or times have to be moved.  We will contact you as soon as possible if a change in schedule is required.   4. No aspirin/ibuprofen for one week before surgery.  If you are on aspirin, any non-steroidal anti-inflammatory medications (Mobic, Aleve, Ibuprofen) should not be taken seven (7) days prior to your surgery.  You make take Tylenol for pain prior to surgery.  5. Medications - If you are taking daily heart and blood pressure medications, seizure, reflux, allergy, asthma, anxiety, pain or diabetes medications, make sure you notify the surgery center/hospital before the day of surgery so they can tell you which medications you should take or avoid the day of surgery. 6. No food or drink after midnight the night before surgery unless directed otherwise by surgical center/hospital staff. 7. No alcoholic beverages 24-hours prior to surgery.  No smoking 24-hours prior or 24-hours after  surgery. 8. Wear loose pants or shorts. They should be loose enough to fit over bandages, boots, and casts. 9. Don't wear slip-on shoes. Sneakers are preferred. 10. Bring your boot with you to the surgery center/hospital.  Also bring crutches or a walker if your physician has prescribed it for you.  If you do not have this equipment, it will be provided for you after surgery. 11. If you have not been contacted by the surgery center/hospital by the day before your surgery, call to confirm the date and time of your surgery. 12. Leave-time from work may vary depending on the type of surgery you have.  Appropriate arrangements should be made prior to surgery with your employer. 13. Prescriptions will be provided immediately following surgery by your doctor.  Fill these as soon as possible after surgery and take the medication as directed. Pain medications will not be refilled on weekends and must be approved by the doctor. 14. Remove nail polish on the operative foot and avoid getting pedicures prior to surgery. 15. Wash the night before surgery.  The night before surgery wash the foot and leg well with water and the antibacterial soap provided. Be sure to pay special attention to beneath the toenails and in between the toes.  Wash for at least three (3) minutes. Rinse thoroughly with water and dry well with a towel.  Perform this wash unless told not to do so by your physician.  Enclosed: 1 Ice pack (please put in freezer the night before surgery)   1 Hibiclens skin cleaner     Pre-op instructions  If you have any questions regarding the instructions, please do not hesitate to call our office.  [place]: [address], [place], [place] [address] -- [phone]  [place]: [address] [place], [place] [address] -- [phone]  [place]: [address]  [place], [place] [address] -- [phone]  [place]: [address], [address], [place],  [address] -- [phone]  Website:  [email or web address] 

## 2016-12-14 ENCOUNTER — Encounter: Payer: Self-pay | Admitting: Sports Medicine

## 2016-12-14 DIAGNOSIS — M2041 Other hammer toe(s) (acquired), right foot: Secondary | ICD-10-CM | POA: Diagnosis not present

## 2016-12-14 DIAGNOSIS — D492 Neoplasm of unspecified behavior of bone, soft tissue, and skin: Secondary | ICD-10-CM | POA: Diagnosis not present

## 2016-12-14 DIAGNOSIS — M21541 Acquired clubfoot, right foot: Secondary | ICD-10-CM | POA: Diagnosis not present

## 2016-12-15 ENCOUNTER — Telehealth: Payer: Self-pay | Admitting: Sports Medicine

## 2016-12-15 NOTE — Telephone Encounter (Signed)
Pt states the numbness is wearing off, she took a pain pill. I instructed pt to rest, don't be on the surgery foot or have below the heart or dangling more than 15 minutes per hour, remain in the boot at all times, keep dressing clean and dry. I told pt to take the pain medication as directed and not let the pain get ahead of the medication, I told pt she could also take OTC ibuprofen as directed on the package if she tolerated it. Pt states she has tylenol by the bed, and I told her not to take the tylenol it was in the pain medication, but she could take the ibuprofen in between the doses of the pain medication. Pt states understanding.

## 2016-12-15 NOTE — Telephone Encounter (Signed)
Post op check made to patient. The phone was answered but no one said anything. I will have my office staff call patient later today to check on her to see how she is doing after surgery. Thanks Dr. Cannon Kettle

## 2016-12-15 NOTE — Telephone Encounter (Signed)
Vicki Mack stated she would call the pt and check on her. I printed the message and gave to [facility].

## 2016-12-16 NOTE — Progress Notes (Signed)
DOS 10.29.18 Rt 2-5 hammertoe repair, removal of callus sub met 2 and prominent bone w kwire at 2nd toe

## 2016-12-22 ENCOUNTER — Ambulatory Visit (INDEPENDENT_AMBULATORY_CARE_PROVIDER_SITE_OTHER): Payer: Medicaid Other

## 2016-12-22 ENCOUNTER — Encounter: Payer: Self-pay | Admitting: Sports Medicine

## 2016-12-22 ENCOUNTER — Ambulatory Visit (INDEPENDENT_AMBULATORY_CARE_PROVIDER_SITE_OTHER): Payer: Medicaid Other | Admitting: Sports Medicine

## 2016-12-22 DIAGNOSIS — L84 Corns and callosities: Secondary | ICD-10-CM

## 2016-12-22 DIAGNOSIS — M2041 Other hammer toe(s) (acquired), right foot: Secondary | ICD-10-CM | POA: Diagnosis not present

## 2016-12-22 DIAGNOSIS — Z9889 Other specified postprocedural states: Secondary | ICD-10-CM

## 2016-12-22 DIAGNOSIS — M79671 Pain in right foot: Secondary | ICD-10-CM

## 2016-12-22 DIAGNOSIS — M216X9 Other acquired deformities of unspecified foot: Secondary | ICD-10-CM

## 2016-12-22 MED ORDER — OXYCODONE-ACETAMINOPHEN 7.5-325 MG PO TABS: 1.0000 | ORAL_TABLET | Freq: Four times a day (QID) | ORAL | 0 refills | Status: DC | PRN

## 2016-12-22 MED ORDER — DIPHENHYDRAMINE HCL 50 MG PO TABS: 50.0000 mg | ORAL_TABLET | Freq: Every evening | ORAL | 0 refills | Status: DC | PRN

## 2016-12-22 NOTE — Progress Notes (Signed)
Subjective: Vicki Mack is a 41 y.o. female patient seen today in office for POV #1  ([id number]), S/P Right met head resection, hammertoe repair 2-5, removal of callus sub met 2. Patient admits pain at surgical site 9/10, denies calf pain, denies headache, chest pain, shortness of breath, vomiting, fever, or chills. Admits she fell and went to ED where they did not find anything wrong. No other issues noted.   Patient Active Problem List   Diagnosis Date Noted  . Migraine headache without aura 08/27/2011  . SAB (spontaneous abortion) 08/27/2011    Current Outpatient Medications on File Prior to Visit  Medication Sig Dispense Refill  . docusate sodium (COLACE) 100 MG capsule Take 100 mg by mouth 2 (two) times daily as needed for mild constipation (Take 1 tablet by mouth every twelve hours as needed for constipation).    . Levonorgestrel (LILETTA, 52 MG, IU) 1 application by Intrauterine route once. Since June 2017    . omeprazole (PRILOSEC) 20 MG capsule Take 1 capsule (20 mg total) by mouth daily. 20 capsule 1  . ondansetron (ZOFRAN) 4 MG tablet Take 1 tablet (4 mg total) by mouth every 8 (eight) hours as needed for nausea or vomiting. 20 tablet 0  . polyethylene glycol powder (GLYCOLAX/MIRALAX) powder Take 17 g by mouth 2 (two) times daily as needed. 3350 g 1  . traMADol (ULTRAM) 50 MG tablet Take 1 tablet (50 mg total) by mouth every 8 (eight) hours as needed. 28 tablet 0   No current facility-administered medications on file prior to visit.     Allergies  Allergen Reactions  . Dilaudid [Hydromorphone Hcl]     Upset stomach  . Phenergan [Promethazine Hcl] Diarrhea  . Codeine Rash  . Penicillins Rash  . Sulfa Antibiotics Rash    Objective: There were no vitals filed for this visit.  General: No acute distress, AAOx3  Right foot: Kwire at 2nd toe and Sutures intact with no gapping or dehiscence at surgical site, mild swelling to right foot, no erythema, no warmth, no  drainage, no signs of infection noted, Capillary fill time <3 seconds in all digits, gross sensation present via light touch to right foot. No pain or crepitation with range of motion  right foot.  No pain with calf compression.   Post Op Xray, Right foot: Kwire in excellent alignment and position. Ostectomy sites healing. Soft tissue swelling within normal limits for post op status.   Assessment and Plan:  Problem List Items Addressed This Visit    None    Visit Diagnoses    Hammertoe of right foot    -  Primary   Relevant Orders   DG Foot Complete Right (Completed)   S/P foot surgery, right       Prominent metatarsal head, unspecified laterality       Callus of foot       Right foot pain         -Patient seen and evaluated -Xrays reviewed -Applied dry sterile dressing to surgical site right foot secured with ACE wrap and stockinet  -Advised patient to make sure to keep dressings clean, dry, and intact to left surgical site, removing the ACE as needed  -Advised patient to continue with CAM boot and crutches -Continue with PRN meds -Rx Pecocet for pain and Benadryl for itchiness  -Advised patient to limit activity to necessity only up for bathroom and food  -Advised patient to ice and elevate as necessary  -Will plan for   suture removal at next office visit dorsally at at toes, may leave plantar sutures in longer and continue with nonweightbearing with crutches and cam boot. In the meantime, patient to call office if any issues or problems arise.   Landis Martins, DPM

## 2016-12-29 ENCOUNTER — Ambulatory Visit (INDEPENDENT_AMBULATORY_CARE_PROVIDER_SITE_OTHER): Payer: Medicaid Other | Admitting: Sports Medicine

## 2016-12-29 ENCOUNTER — Other Ambulatory Visit: Payer: Self-pay | Admitting: Sports Medicine

## 2016-12-29 ENCOUNTER — Ambulatory Visit (INDEPENDENT_AMBULATORY_CARE_PROVIDER_SITE_OTHER): Payer: Medicaid Other

## 2016-12-29 ENCOUNTER — Encounter: Payer: Self-pay | Admitting: Sports Medicine

## 2016-12-29 DIAGNOSIS — M2041 Other hammer toe(s) (acquired), right foot: Secondary | ICD-10-CM

## 2016-12-29 DIAGNOSIS — M216X9 Other acquired deformities of unspecified foot: Secondary | ICD-10-CM

## 2016-12-29 DIAGNOSIS — L84 Corns and callosities: Secondary | ICD-10-CM

## 2016-12-29 DIAGNOSIS — M79671 Pain in right foot: Secondary | ICD-10-CM

## 2016-12-29 DIAGNOSIS — Z9889 Other specified postprocedural states: Secondary | ICD-10-CM

## 2016-12-29 MED ORDER — OXYCODONE-ACETAMINOPHEN 10-325 MG PO TABS: 1.0000 | ORAL_TABLET | Freq: Three times a day (TID) | ORAL | 0 refills | Status: DC | PRN

## 2016-12-29 NOTE — Progress Notes (Signed)
Subjective: Vicki Mack is a 41 y.o. female patient seen today in office for POV #2  ([id number]), S/P Right met head resection, hammertoe repair 2-5, removal of callus sub met 2. Patient admits pain at surgical site that's throbbing in nature and hasn't eased up since surgery, denies calf pain, denies headache, chest pain, shortness of breath, vomiting, fever, or chills. No other issues noted.   Patient Active Problem List   Diagnosis Date Noted  . Migraine headache without aura 08/27/2011  . SAB (spontaneous abortion) 08/27/2011    Current Outpatient Medications on File Prior to Visit  Medication Sig Dispense Refill  . diphenhydrAMINE (BENADRYL) 50 MG tablet Take 1 tablet (50 mg total) at bedtime as needed by mouth for itching. 30 tablet 0  . docusate sodium (COLACE) 100 MG capsule Take 100 mg by mouth 2 (two) times daily as needed for mild constipation (Take 1 tablet by mouth every twelve hours as needed for constipation).    . Levonorgestrel (LILETTA, 52 MG, IU) 1 application by Intrauterine route once. Since June 2017    . omeprazole (PRILOSEC) 20 MG capsule Take 1 capsule (20 mg total) by mouth daily. 20 capsule 1  . ondansetron (ZOFRAN) 4 MG tablet Take 1 tablet (4 mg total) by mouth every 8 (eight) hours as needed for nausea or vomiting. 20 tablet 0  . oxyCODONE-acetaminophen (PERCOCET) 7.5-325 MG tablet Take 1 tablet every 6 (six) hours as needed by mouth for severe pain. 21 tablet 0  . polyethylene glycol powder (GLYCOLAX/MIRALAX) powder Take 17 g by mouth 2 (two) times daily as needed. 3350 g 1  . traMADol (ULTRAM) 50 MG tablet Take 1 tablet (50 mg total) by mouth every 8 (eight) hours as needed. 28 tablet 0   No current facility-administered medications on file prior to visit.     Allergies  Allergen Reactions  . Dilaudid [Hydromorphone Hcl]     Upset stomach  . Phenergan [Promethazine Hcl] Diarrhea  . Codeine Rash  . Penicillins Rash  . Sulfa Antibiotics Rash     Objective: There were no vitals filed for this visit.  General: No acute distress, AAOx3  Right foot: Kwire at 2nd toe and Sutures intact with no gapping or dehiscence at surgical site, mild swelling to right foot, no erythema, no warmth, no drainage, no signs of infection noted, Capillary fill time <3 seconds in all digits, gross sensation present via light touch to right foot. No pain or crepitation with range of motion  right foot.  No pain with calf compression.   Post Op Xray, Right foot: Kwire in excellent alignment and position. Ostectomy sites healing. Soft tissue swelling within normal limits for post op status. No acute changes from last visit.   Assessment and Plan:  Problem List Items Addressed This Visit    None    Visit Diagnoses    S/P foot surgery, right    -  Primary   Hammertoe of right foot       Prominent metatarsal head, unspecified laterality       Callus of foot       Right foot pain         -Patient seen and evaluated -Xrays reviewed  -Every other suture was removed. Kept Kwire in place since no acute issues on xray  -Applied dry sterile dressing to surgical site right foot secured with ACE wrap and stockinet  -Advised patient to make sure to keep dressings clean, dry, and intact to left surgical  site, removing the ACE as needed  -Advised patient to continue with CAM boot and crutches -Continue with PRN meds -Refilled Pecocet for pain, increased dose to 10/337m  -Advised patient to limit activity to necessity only up for bathroom and food  -Advised patient to ice and elevate as necessary  -Will plan for finishing suture removal at next office visit, patient to call office if any issues or problems arise.   TLandis Martins DPM

## 2017-01-12 ENCOUNTER — Ambulatory Visit (INDEPENDENT_AMBULATORY_CARE_PROVIDER_SITE_OTHER): Payer: Medicaid Other | Admitting: Sports Medicine

## 2017-01-12 ENCOUNTER — Encounter: Payer: Self-pay | Admitting: Sports Medicine

## 2017-01-12 VITALS — BP 139/96 | HR 88 | Resp 16

## 2017-01-12 DIAGNOSIS — M2041 Other hammer toe(s) (acquired), right foot: Secondary | ICD-10-CM

## 2017-01-12 DIAGNOSIS — M216X9 Other acquired deformities of unspecified foot: Secondary | ICD-10-CM

## 2017-01-12 DIAGNOSIS — M79671 Pain in right foot: Secondary | ICD-10-CM

## 2017-01-12 DIAGNOSIS — L84 Corns and callosities: Secondary | ICD-10-CM

## 2017-01-12 DIAGNOSIS — Z9889 Other specified postprocedural states: Secondary | ICD-10-CM

## 2017-01-12 MED ORDER — OXYCODONE-ACETAMINOPHEN 10-325 MG PO TABS: 1.0000 | ORAL_TABLET | Freq: Three times a day (TID) | ORAL | 0 refills | Status: DC | PRN

## 2017-01-12 NOTE — Progress Notes (Signed)
Subjective: Vicki Mack is a 41 y.o. female patient seen today in office for POV #3  ([id number]), S/P Right met head resection, hammertoe repair 2-5, removal of callus sub met 2. Patient admits pain at surgical site that's better since last visit now 3/10, denies calf pain, denies headache, chest pain, shortness of breath, vomiting, fever, or chills. No other issues noted.   Patient Active Problem List   Diagnosis Date Noted  . Migraine headache without aura 08/27/2011  . SAB (spontaneous abortion) 08/27/2011    Current Outpatient Medications on File Prior to Visit  Medication Sig Dispense Refill  . diphenhydrAMINE (BENADRYL) 50 MG tablet Take 1 tablet (50 mg total) at bedtime as needed by mouth for itching. 30 tablet 0  . docusate sodium (COLACE) 100 MG capsule Take 100 mg by mouth 2 (two) times daily as needed for mild constipation (Take 1 tablet by mouth every twelve hours as needed for constipation).    . Levonorgestrel (LILETTA, 52 MG, IU) 1 application by Intrauterine route once. Since June 2017    . omeprazole (PRILOSEC) 20 MG capsule Take 1 capsule (20 mg total) by mouth daily. 20 capsule 1  . ondansetron (ZOFRAN) 4 MG tablet Take 1 tablet (4 mg total) by mouth every 8 (eight) hours as needed for nausea or vomiting. 20 tablet 0  . polyethylene glycol powder (GLYCOLAX/MIRALAX) powder Take 17 g by mouth 2 (two) times daily as needed. 3350 g 1  . traMADol (ULTRAM) 50 MG tablet Take 1 tablet (50 mg total) by mouth every 8 (eight) hours as needed. 28 tablet 0   No current facility-administered medications on file prior to visit.     Allergies  Allergen Reactions  . Dilaudid [Hydromorphone Hcl]     Upset stomach  . Phenergan [Promethazine Hcl] Diarrhea  . Codeine Rash  . Penicillins Rash  . Sulfa Antibiotics Rash    Objective: There were no vitals filed for this visit.  General: No acute distress, AAOx3   Right foot: Kwire at 2nd toe and Remaining sutures intact  with no gapping or dehiscence at surgical site, mild swelling to right foot, no erythema, no warmth, no drainage, no signs of infection noted, Capillary fill time <3 seconds in all digits, gross sensation present via light touch to right foot. Mild tenderness to surgical site.  No pain with calf compression.   Assessment and Plan:  Problem List Items Addressed This Visit    None    Visit Diagnoses    S/P foot surgery, right    -  Primary   Hammertoe of right foot       Prominent metatarsal head, unspecified laterality       Callus of foot       Right foot pain         -Patient seen and evaluated -Every other suture was removed plantarly and all sutures were removed dorsally.Kwire to remain in place until next visit.  -Applied dry sterile dressing to surgical site right foot secured with ACE wrap and stockinet  -Advised patient to make sure to keep dressings clean, dry, and intact to left surgical site, removing the ACE as needed  -Advised patient to continue with CAM boot and crutches -Continue with PRN meds -Refilled Percocet 10/389m  -Advised patient to ice and elevate as necessary  -Will plan for finishing suture removal, k-wire removal and xrays at next office visit, patient to call office if any issues or problems arise.   TLandis Martins DPM

## 2017-01-26 ENCOUNTER — Ambulatory Visit (INDEPENDENT_AMBULATORY_CARE_PROVIDER_SITE_OTHER): Payer: Medicaid Other

## 2017-01-26 ENCOUNTER — Ambulatory Visit (INDEPENDENT_AMBULATORY_CARE_PROVIDER_SITE_OTHER): Payer: Medicaid Other | Admitting: Sports Medicine

## 2017-01-26 ENCOUNTER — Encounter: Payer: Self-pay | Admitting: Sports Medicine

## 2017-01-26 DIAGNOSIS — L84 Corns and callosities: Secondary | ICD-10-CM

## 2017-01-26 DIAGNOSIS — Z9889 Other specified postprocedural states: Secondary | ICD-10-CM

## 2017-01-26 DIAGNOSIS — M2041 Other hammer toe(s) (acquired), right foot: Secondary | ICD-10-CM

## 2017-01-26 DIAGNOSIS — M79671 Pain in right foot: Secondary | ICD-10-CM

## 2017-01-26 DIAGNOSIS — M216X9 Other acquired deformities of unspecified foot: Secondary | ICD-10-CM

## 2017-01-26 MED ORDER — OXYCODONE-ACETAMINOPHEN 10-325 MG PO TABS: 1.0000 | ORAL_TABLET | Freq: Three times a day (TID) | ORAL | 0 refills | Status: DC | PRN

## 2017-01-26 NOTE — Progress Notes (Signed)
Subjective: Vicki Mack is a 41 y.o. female patient seen today in office for POV #4  ([id number]), S/P Right met head resection, hammertoe repair 2-5, removal of callus sub met 2. Patient admits pain at surgical site that's getting better, patient is in CAM boot and is ready for PIN to be removed, taking Tylenol as needed, denies calf pain, denies headache, chest pain, shortness of breath, vomiting, fever, or chills. No other issues noted.   Patient Active Problem List   Diagnosis Date Noted  . Migraine headache without aura 08/27/2011  . SAB (spontaneous abortion) 08/27/2011    Current Outpatient Medications on File Prior to Visit  Medication Sig Dispense Refill  . diphenhydrAMINE (BENADRYL) 50 MG tablet Take 1 tablet (50 mg total) at bedtime as needed by mouth for itching. 30 tablet 0  . docusate sodium (COLACE) 100 MG capsule Take 100 mg by mouth 2 (two) times daily as needed for mild constipation (Take 1 tablet by mouth every twelve hours as needed for constipation).    . Levonorgestrel (LILETTA, 52 MG, IU) 1 application by Intrauterine route once. Since June 2017    . omeprazole (PRILOSEC) 20 MG capsule Take 1 capsule (20 mg total) by mouth daily. 20 capsule 1  . ondansetron (ZOFRAN) 4 MG tablet Take 1 tablet (4 mg total) by mouth every 8 (eight) hours as needed for nausea or vomiting. 20 tablet 0  . polyethylene glycol powder (GLYCOLAX/MIRALAX) powder Take 17 g by mouth 2 (two) times daily as needed. 3350 g 1  . traMADol (ULTRAM) 50 MG tablet Take 1 tablet (50 mg total) by mouth every 8 (eight) hours as needed. 28 tablet 0   No current facility-administered medications on file prior to visit.     Allergies  Allergen Reactions  . Dilaudid [Hydromorphone Hcl]     Upset stomach  . Phenergan [Promethazine Hcl] Diarrhea  . Codeine Rash  . Penicillins Rash  . Sulfa Antibiotics Rash    Objective: There were no vitals filed for this visit.  General: No acute distress,  AAOx3   Right foot: Kwire at 2nd toe and Remaining sutures intact with no gapping or dehiscence at surgical site, mild swelling to right foot, no erythema, no warmth, no drainage, no signs of infection noted, Capillary fill time <3 seconds in all digits, gross sensation present via light touch to right foot. Mild tenderness to surgical site.  No pain with calf compression.   Xray: Consistent with post op status Kwire in place, no acute other findings  Assessment and Plan:  Problem List Items Addressed This Visit    None    Visit Diagnoses    Hammertoe of right foot    -  Primary   Relevant Orders   DG Foot Complete Right   S/P foot surgery, right       Prominent metatarsal head, unspecified laterality       Callus of foot       Right foot pain         -Patient seen and evaluated -Xrays reviewed  -Remaining sutures and kwire was removed  -Recommend patient to allow steristrips to fall off on their own at 2nd toe  -Advised patient to continue with CAM boot for the next week and then slowly transition to normal shoe as tolerated  -Continue with PRN meds -Refilled Percocet 10/380m  -Advised patient to ice and elevate as necessary  -Will plan for post op check at next office visit, patient to call  office if any issues or problems arise.   Landis Martins, DPM

## 2017-02-23 ENCOUNTER — Encounter: Payer: Self-pay | Admitting: Sports Medicine

## 2017-02-23 ENCOUNTER — Ambulatory Visit (INDEPENDENT_AMBULATORY_CARE_PROVIDER_SITE_OTHER): Payer: Medicaid Other | Admitting: Sports Medicine

## 2017-02-23 DIAGNOSIS — M216X9 Other acquired deformities of unspecified foot: Secondary | ICD-10-CM

## 2017-02-23 DIAGNOSIS — M79671 Pain in right foot: Secondary | ICD-10-CM

## 2017-02-23 DIAGNOSIS — M2041 Other hammer toe(s) (acquired), right foot: Secondary | ICD-10-CM

## 2017-02-23 DIAGNOSIS — L84 Corns and callosities: Secondary | ICD-10-CM

## 2017-02-23 DIAGNOSIS — Z9889 Other specified postprocedural states: Secondary | ICD-10-CM

## 2017-02-23 MED ORDER — DOCUSATE SODIUM 100 MG PO CAPS: 100.0000 mg | ORAL_CAPSULE | Freq: Two times a day (BID) | ORAL | 0 refills | Status: DC | PRN

## 2017-02-23 MED ORDER — OXYCODONE-ACETAMINOPHEN 10-325 MG PO TABS: 1.0000 | ORAL_TABLET | Freq: Three times a day (TID) | ORAL | 0 refills | Status: DC | PRN

## 2017-02-23 NOTE — Progress Notes (Signed)
Subjective: Vicki Mack is a 42 y.o. female patient seen today in office for POV #5  ([id number]), S/P Right met head resection, hammertoe repair 2-5, removal of callus sub met 2. Patient admits pain at surgical site of throbbing to toes with swelling and request refill on pain medication, denies calf pain, denies headache, chest pain, shortness of breath, vomiting, fever, or chills. No other issues noted.   Patient Active Problem List   Diagnosis Date Noted  . Migraine headache without aura 08/27/2011  . SAB (spontaneous abortion) 08/27/2011    Current Outpatient Medications on File Prior to Visit  Medication Sig Dispense Refill  . diphenhydrAMINE (BENADRYL) 50 MG tablet Take 1 tablet (50 mg total) at bedtime as needed by mouth for itching. 30 tablet 0  . Levonorgestrel (LILETTA, 52 MG, IU) 1 application by Intrauterine route once. Since June 2017    . omeprazole (PRILOSEC) 20 MG capsule Take 1 capsule (20 mg total) by mouth daily. 20 capsule 1  . ondansetron (ZOFRAN) 4 MG tablet Take 1 tablet (4 mg total) by mouth every 8 (eight) hours as needed for nausea or vomiting. 20 tablet 0  . polyethylene glycol powder (GLYCOLAX/MIRALAX) powder Take 17 g by mouth 2 (two) times daily as needed. 3350 g 1  . traMADol (ULTRAM) 50 MG tablet Take 1 tablet (50 mg total) by mouth every 8 (eight) hours as needed. 28 tablet 0   No current facility-administered medications on file prior to visit.     Allergies  Allergen Reactions  . Dilaudid [Hydromorphone Hcl]     Upset stomach  . Phenergan [Promethazine Hcl] Diarrhea  . Codeine Rash  . Penicillins Rash  . Sulfa Antibiotics Rash    Objective: There were no vitals filed for this visit.  General: No acute distress, AAOx3   Right foot: Incisions well healed, mild swelling to right foot, no erythema, no warmth, no drainage, no signs of infection noted, Capillary fill time <3 seconds in all digits, gross sensation present via light touch to  right foot. Mild tenderness to surgical sites.  No pain with calf compression.   Assessment and Plan:  Problem List Items Addressed This Visit    None    Visit Diagnoses    Hammertoe of right foot    -  Primary   Prominent metatarsal head, unspecified laterality       Callus of foot       S/P foot surgery, right       Right foot pain         -Patient seen and evaluated -Recommend continue with scar creams and normal shoe as tolerated  -Continue with PRN meds -Refilled Percocet 10/319m and Colace -Advised patient to ice and elevate as necessary  -Will plan for xray and final post op check at next office visit, patient to call office if any issues or problems arise.   TLandis Martins DPM

## 2017-02-25 ENCOUNTER — Other Ambulatory Visit: Payer: Self-pay | Admitting: Family Medicine

## 2017-02-25 DIAGNOSIS — Z139 Encounter for screening, unspecified: Secondary | ICD-10-CM

## 2017-03-01 ENCOUNTER — Other Ambulatory Visit: Payer: Self-pay

## 2017-03-01 ENCOUNTER — Ambulatory Visit (HOSPITAL_COMMUNITY)
Admission: EM | Admit: 2017-03-01 | Discharge: 2017-03-01 | Disposition: A | Discharge status: Home / Self Care | Payer: Medicaid Other

## 2017-03-01 ENCOUNTER — Emergency Department (HOSPITAL_COMMUNITY): Payer: Medicaid Other

## 2017-03-01 ENCOUNTER — Emergency Department (HOSPITAL_COMMUNITY)
Admission: EM | Admit: 2017-03-01 | Discharge: 2017-03-01 | Disposition: A | Discharge status: Home / Self Care | Payer: Medicaid Other | Attending: Emergency Medicine | Admitting: Emergency Medicine

## 2017-03-01 ENCOUNTER — Encounter (HOSPITAL_COMMUNITY): Payer: Self-pay

## 2017-03-01 DIAGNOSIS — Z79899 Other long term (current) drug therapy: Secondary | ICD-10-CM | POA: Insufficient documentation

## 2017-03-01 DIAGNOSIS — G43009 Migraine without aura, not intractable, without status migrainosus: Secondary | ICD-10-CM | POA: Insufficient documentation

## 2017-03-01 DIAGNOSIS — R51 Headache: Secondary | ICD-10-CM | POA: Diagnosis present

## 2017-03-01 LAB — I-STAT BETA HCG BLOOD, ED (MC, WL, AP ONLY): I-stat hCG, quantitative: <5 m[IU]/mL (ref <5)

## 2017-03-01 MED ORDER — ONDANSETRON 4 MG PO TBDP: 4.0000 mg | ORAL_TABLET | Freq: Three times a day (TID) | ORAL | 0 refills | Status: DC | PRN

## 2017-03-01 MED ORDER — KETOROLAC TROMETHAMINE 15 MG/ML IJ SOLN
15.0000 mg | Freq: Once | INTRAMUSCULAR | Status: AC
  Administered 2017-03-01: 15 mg via INTRAVENOUS
  Filled 2017-03-01: qty 1

## 2017-03-01 MED ORDER — SODIUM CHLORIDE 0.9 % IV SOLN
INTRAVENOUS | Status: DC
  Administered 2017-03-01: 14:00:00 via INTRAVENOUS

## 2017-03-01 MED ORDER — ONDANSETRON HCL 4 MG/2ML IJ SOLN
4.0000 mg | Freq: Once | INTRAMUSCULAR | Status: AC
  Administered 2017-03-01: 4 mg via INTRAVENOUS
  Filled 2017-03-01: qty 2

## 2017-03-01 MED ORDER — SODIUM CHLORIDE 0.9 % IV BOLUS (SEPSIS)
1000.0000 mL | Freq: Once | INTRAVENOUS | Status: AC
  Administered 2017-03-01: 1000 mL via INTRAVENOUS

## 2017-03-01 NOTE — ED Provider Notes (Signed)
[facility] Provider Note   CSN: [id number] Arrival date & time: 03/01/17  1229     History   Chief Complaint Chief Complaint  Patient presents with  . Numbness  . Migraine    HPI Vicki Mack is a 42 y.o. female.  Pt presents to the ED today with right sided facial numbness and posterior headache.  Pt said she woke up this morning with these sx.  No other neurologic abnormalities.  The pt said she has had these sx in the past with a migraine, but her headache does not feel like a migraine.  The pt has seen a neurologist for migraines, but not in a few years.  No n/v.      Past Medical History:  Diagnosis Date  . Abnormal Pap smear    colpo with biopsy at age 45  . Bipolar 1 disorder ([place])   . Infertility    able to become pregnant with Clomid  . Low iron   . Migraine   . Migraine   . PTSD (post-traumatic stress disorder)    seeing neuropsychologist    Patient Active Problem List   Diagnosis Date Noted  . Migraine headache without aura 08/27/2011  . SAB (spontaneous abortion) 08/27/2011    Past Surgical History:  Procedure Laterality Date  . NO PAST SURGERIES      OB History    Gravida Para Term Preterm AB Living   3 1 1   2 1    SAB TAB Ectopic Multiple Live Births   2       1       Home Medications    Prior to Admission medications   Medication Sig Start Date End Date Taking? Authorizing Provider  diphenhydrAMINE (BENADRYL) 50 MG tablet Take 1 tablet (50 mg total) at bedtime as needed by mouth for itching. 12/22/16   Landis Martins, DPM  docusate sodium (COLACE) 100 MG capsule Take 1 capsule (100 mg total) by mouth 2 (two) times daily as needed for mild constipation (as needed for constipation). 02/23/17   Landis Martins, DPM  Levonorgestrel (LILETTA, 52 MG, IU) 1 application by Intrauterine route once. Since June 2017    [provider]  omeprazole (PRILOSEC) 20 MG capsule Take 1 capsule (20  mg total) by mouth daily. 03/24/16   Robyn Haber, MD  ondansetron (ZOFRAN ODT) 4 MG disintegrating tablet Take 1 tablet (4 mg total) by mouth every 8 (eight) hours as needed. 03/01/17   Isla Pence, MD  ondansetron (ZOFRAN) 4 MG tablet Take 1 tablet (4 mg total) by mouth every 8 (eight) hours as needed for nausea or vomiting. 08/04/16   Landis Martins, DPM  oxyCODONE-acetaminophen (PERCOCET) 10-325 MG tablet Take 1 tablet by mouth every 8 (eight) hours as needed for pain. 02/23/17   Landis Martins, DPM  polyethylene glycol powder (GLYCOLAX/MIRALAX) powder Take 17 g by mouth 2 (two) times daily as needed. 10/16/16   Diallo, Earna Coder, MD  traMADol (ULTRAM) 50 MG tablet Take 1 tablet (50 mg total) by mouth every 8 (eight) hours as needed. 08/04/16   Landis Martins, DPM    Family History Family History  Problem Relation Age of Onset  . Diabetes Mother   . Hypertension Mother   . Diabetes Father   . Hypertension Father   . Alzheimer's disease Father   . Other Sister        breast cysts  . Breast cancer Paternal Aunt  had mastectomy  . Alzheimer's disease Paternal Aunt     Social History Social History   Tobacco Use  . Smoking status: Never Smoker  . Smokeless tobacco: Never Used  Substance Use Topics  . Alcohol use: No  . Drug use: No     Allergies   Dilaudid [hydromorphone hcl]; Phenergan [promethazine hcl]; Codeine; Penicillins; and Sulfa antibiotics   Review of Systems Review of Systems  Neurological: Positive for numbness and headaches.  All other systems reviewed and are negative.    Physical Exam Updated Vital Signs BP 126/85 (BP Location: Left Arm)   Pulse 68   Temp 98.5 F (36.9 C) (Oral)   Resp 18   Ht 5\' 7"  (1.702 m)   Wt 56.7 kg (125 lb)   LMP 01/30/2017   SpO2 100%   BMI 19.58 kg/m   Physical Exam  Constitutional: She is oriented to person, place, and time. She appears well-developed and well-nourished.  HENT:  Head: Normocephalic and  atraumatic.  Right Ear: External ear normal.  Left Ear: External ear normal.  Nose: Nose normal.  Mouth/Throat: Oropharynx is clear and moist.  Eyes: Conjunctivae and EOM are normal. Pupils are equal, round, and reactive to light.  Neck: Normal range of motion. Neck supple.  Cardiovascular: Normal rate, regular rhythm, normal heart sounds and intact distal pulses.  Pulmonary/Chest: Effort normal and breath sounds normal.  Abdominal: Soft. Bowel sounds are normal.  Musculoskeletal: Normal range of motion.  Neurological: She is alert and oriented to person, place, and time.  Skin: Skin is warm and dry. Capillary refill takes less than 2 seconds.  Psychiatric: She has a normal mood and affect. Her behavior is normal. Judgment and thought content normal.  Nursing note and vitals reviewed.    ED Treatments / Results  Labs (all labs ordered are listed, but only abnormal results are displayed) Labs Reviewed  I-STAT BETA HCG BLOOD, ED ([facility], [facility], [facility] ONLY)    EKG  EKG Interpretation None       Radiology Ct Head Wo Contrast  Result Date: 03/01/2017 CLINICAL DATA:  Headache beginning 6 hours ago. Right-sided symptoms. EXAM: CT HEAD WITHOUT CONTRAST TECHNIQUE: Contiguous axial images were obtained from the base of the skull through the vertex without intravenous contrast. COMPARISON:  12/17/2015 FINDINGS: Brain: The brain shows a normal appearance without evidence of malformation, atrophy, old or acute small or large vessel infarction, mass lesion, hemorrhage, hydrocephalus or extra-axial collection. Vascular: No hyperdense vessel. No evidence of atherosclerotic calcification. Skull: Normal.  No traumatic finding.  No focal bone lesion. Sinuses/Orbits: Sinuses are clear. Orbits appear normal. Mastoids are clear. Other: None significant IMPRESSION: Normal head CT.  No abnormality seen to explain headache. Electronically Signed   By: Nelson Chimes M.D.   On: 03/01/2017 14:34     Procedures Procedures (including critical care time)  Medications Ordered in ED Medications  sodium chloride 0.9 % bolus 1,000 mL (1,000 mLs Intravenous New Bag/Given 03/01/17 1406)    And  0.9 %  sodium chloride infusion ( Intravenous New Bag/Given 03/01/17 1406)  ketorolac (TORADOL) 15 MG/ML injection 15 mg (15 mg Intravenous Given 03/01/17 1406)  ondansetron (ZOFRAN) injection 4 mg (4 mg Intravenous Given 03/01/17 1406)     Initial Impression / Assessment and Plan / ED Course  I have reviewed the triage vital signs and the nursing notes.  Pertinent labs & imaging results that were available during my care of the patient were reviewed by me and considered in my  medical decision making (see chart for details).    Pt is feeling much better. CT head ok.  Pt is stable for d/c.  She is instructed to f/u with pcp/neurology.  Return if worse.  Final Clinical Impressions(s) / ED Diagnoses   Final diagnoses:  Atypical migraine    ED Discharge Orders        Ordered    ondansetron (ZOFRAN ODT) 4 MG disintegrating tablet  Every 8 hours PRN     03/01/17 1513       Isla Pence, MD 03/01/17 1516

## 2017-03-01 NOTE — ED Triage Notes (Signed)
Complaints of feeling dizzy and light headed, right face and right arm is numb, feels pins in the back of her head.  Reports symptoms started 9:30am.  Complains of frontal headache.  No weakness in any extremity.    History of the same-2015/2016, no diagnosis.

## 2017-03-01 NOTE — ED Notes (Signed)
Discussed with dr Marcille Blanco.  Patient chose to go to ed

## 2017-03-01 NOTE — ED Triage Notes (Addendum)
Per Pt, Pt is coming from home with complaints of posterior and frontal headache and right sided facial numbness that started when she woke up this morning. Hx of Migraines. Reports similar symptoms in the past.

## 2017-03-23 ENCOUNTER — Encounter: Payer: Medicaid Other | Admitting: Sports Medicine

## 2017-03-29 ENCOUNTER — Ambulatory Visit
Admission: RE | Admit: 2017-03-29 | Discharge: 2017-03-29 | Disposition: A | Discharge status: Home / Self Care | Payer: Medicaid Other | Source: Ambulatory Visit | Attending: Family Medicine | Admitting: Family Medicine

## 2017-03-29 DIAGNOSIS — Z139 Encounter for screening, unspecified: Secondary | ICD-10-CM

## 2017-03-30 ENCOUNTER — Encounter: Payer: Medicaid Other | Admitting: Sports Medicine

## 2017-03-31 NOTE — Progress Notes (Signed)
Patient canceled appointment -Dr. Cannon Kettle This encounter was created in error - please disregard.

## 2017-04-13 ENCOUNTER — Ambulatory Visit: Payer: Medicaid Other | Admitting: Family Medicine

## 2017-07-03 ENCOUNTER — Other Ambulatory Visit: Payer: Self-pay

## 2017-07-03 ENCOUNTER — Encounter (HOSPITAL_COMMUNITY): Payer: Self-pay | Admitting: Emergency Medicine

## 2017-07-03 ENCOUNTER — Emergency Department (HOSPITAL_COMMUNITY)
Admission: EM | Admit: 2017-07-03 | Discharge: 2017-07-04 | Disposition: A | Discharge status: Home / Self Care | Payer: No Typology Code available for payment source | Attending: Emergency Medicine | Admitting: Emergency Medicine

## 2017-07-03 DIAGNOSIS — Y92410 Unspecified street and highway as the place of occurrence of the external cause: Secondary | ICD-10-CM | POA: Insufficient documentation

## 2017-07-03 DIAGNOSIS — Y9389 Activity, other specified: Secondary | ICD-10-CM | POA: Insufficient documentation

## 2017-07-03 DIAGNOSIS — Z79899 Other long term (current) drug therapy: Secondary | ICD-10-CM | POA: Diagnosis not present

## 2017-07-03 DIAGNOSIS — S299XXA Unspecified injury of thorax, initial encounter: Secondary | ICD-10-CM | POA: Diagnosis not present

## 2017-07-03 DIAGNOSIS — M5489 Other dorsalgia: Secondary | ICD-10-CM | POA: Insufficient documentation

## 2017-07-03 DIAGNOSIS — Y999 Unspecified external cause status: Secondary | ICD-10-CM | POA: Insufficient documentation

## 2017-07-03 DIAGNOSIS — S298XXA Other specified injuries of thorax, initial encounter: Secondary | ICD-10-CM
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
## 2017-07-04 MED ORDER — CYCLOBENZAPRINE HCL 10 MG PO TABS: 10.0000 mg | ORAL_TABLET | Freq: Two times a day (BID) | ORAL | 0 refills | Status: DC | PRN

## 2017-07-04 MED ORDER — IBUPROFEN 400 MG PO TABS: 400.0000 mg | ORAL_TABLET | Freq: Three times a day (TID) | ORAL | 0 refills | Status: DC | PRN

## 2017-07-04 MED ORDER — IBUPROFEN 400 MG PO TABS
400.0000 mg | ORAL_TABLET | Freq: Once | ORAL | Status: AC
  Administered 2017-07-04: 400 mg via ORAL
  Filled 2017-07-04: qty 1

## 2017-07-04 MED ORDER — HYDROCODONE-ACETAMINOPHEN 5-325 MG PO TABS
1.0000 | ORAL_TABLET | Freq: Once | ORAL | Status: AC
  Administered 2017-07-04: 1 via ORAL
  Filled 2017-07-04: qty 1
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
## 2017-08-16 ENCOUNTER — Inpatient Hospital Stay (HOSPITAL_COMMUNITY)
Admission: AD | Admit: 2017-08-16 | Discharge: 2017-08-16 | Disposition: A | Discharge status: Home / Self Care | Payer: Self-pay | Source: Ambulatory Visit | Attending: Obstetrics & Gynecology | Admitting: Obstetrics & Gynecology

## 2017-08-16 DIAGNOSIS — Z882 Allergy status to sulfonamides status: Secondary | ICD-10-CM | POA: Insufficient documentation

## 2017-08-16 DIAGNOSIS — R102 Pelvic and perineal pain: Secondary | ICD-10-CM

## 2017-08-16 DIAGNOSIS — Z975 Presence of (intrauterine) contraceptive device: Secondary | ICD-10-CM | POA: Insufficient documentation

## 2017-08-16 DIAGNOSIS — B9689 Other specified bacterial agents as the cause of diseases classified elsewhere: Secondary | ICD-10-CM

## 2017-08-16 DIAGNOSIS — Z885 Allergy status to narcotic agent status: Secondary | ICD-10-CM | POA: Insufficient documentation

## 2017-08-16 DIAGNOSIS — Z888 Allergy status to other drugs, medicaments and biological substances status: Secondary | ICD-10-CM | POA: Insufficient documentation

## 2017-08-16 DIAGNOSIS — F4310 Post-traumatic stress disorder, unspecified: Secondary | ICD-10-CM | POA: Insufficient documentation

## 2017-08-16 DIAGNOSIS — Z791 Long term (current) use of non-steroidal anti-inflammatories (NSAID): Secondary | ICD-10-CM | POA: Insufficient documentation

## 2017-08-16 DIAGNOSIS — N898 Other specified noninflammatory disorders of vagina: Secondary | ICD-10-CM

## 2017-08-16 DIAGNOSIS — N760 Acute vaginitis: Secondary | ICD-10-CM | POA: Insufficient documentation

## 2017-08-16 DIAGNOSIS — Z79899 Other long term (current) drug therapy: Secondary | ICD-10-CM | POA: Insufficient documentation

## 2017-08-16 DIAGNOSIS — F319 Bipolar disorder, unspecified: Secondary | ICD-10-CM | POA: Insufficient documentation

## 2017-08-16 DIAGNOSIS — Z880 Allergy status to penicillin: Secondary | ICD-10-CM | POA: Insufficient documentation

## 2017-08-16 DIAGNOSIS — Z79891 Long term (current) use of opiate analgesic: Secondary | ICD-10-CM | POA: Insufficient documentation

## 2017-08-16 LAB — POCT PREGNANCY, URINE: Preg Test, Ur: NEGATIVE

## 2017-08-16 LAB — URINALYSIS, ROUTINE W REFLEX MICROSCOPIC
Bilirubin Urine: NEGATIVE
Glucose, UA: NEGATIVE mg/dL
Hgb urine dipstick: NEGATIVE
Ketones, ur: NEGATIVE mg/dL
Leukocytes, UA: NEGATIVE
Nitrite: NEGATIVE
Protein, ur: NEGATIVE mg/dL
Specific Gravity, Urine: 1.005 (ref 1.005–1.030)
pH: 6 (ref 5.0–8.0)

## 2017-08-16 LAB — WET PREP, GENITAL
Sperm: NONE SEEN
Trich, Wet Prep: NONE SEEN
Yeast Wet Prep HPF POC: NONE SEEN

## 2017-08-16 MED ORDER — KETOROLAC TROMETHAMINE 60 MG/2ML IM SOLN
60.0000 mg | Freq: Once | INTRAMUSCULAR | Status: AC
  Administered 2017-08-16: 60 mg via INTRAMUSCULAR
  Filled 2017-08-16: qty 2

## 2017-08-16 MED ORDER — TINIDAZOLE 500 MG PO TABS: 2.0000 g | ORAL_TABLET | Freq: Every day | ORAL | 0 refills | Status: DC

## 2017-08-16 NOTE — MAU Note (Addendum)
States she had an IUD placed 2 years ago.  Is having abdominal cramping and bloating for the past month.  Also having pelvic pain.  States she cannot feel the strings.  Also having "cottage cheese" discharge with a foul odor and itching.  States she called the clinic to make an appointment and they said they would call her tomorrow but she can't wait due to her pain.  Took midol for the pain at 1800 without much relief.  States she usually has periods but hasn't had one since May. Has not taken a UPT.

## 2017-08-16 NOTE — MAU Provider Note (Signed)
Chief Complaint: Vaginal Discharge and Pelvic Pain   First Provider Initiated Contact with Patient 08/16/17 2206      SUBJECTIVE HPI: Vicki Mack is a 42 y.o. [id number] not currently pregnant who presents to maternity admissions reporting IUD concerns. She had her IUD placed 2 years ago, reports not being able to feel strings with self check 2 days ago. She reports last LMP was in May and she has not had a cycle since- reports getting regular periods with IUD, has had unprotected intercourse during that time but did not take a HPT. She reports new onset of pelvic pain that started last month. She reports vaginal discharge and abdominal bloating is associated with the pelvic pain. She describes pelvic pain as cramping specific to her lower right. She rates pain 6/10- has taken ibuprofen for abdominal pain with no relief. Reports vaginal discharge as "cottage cheese" discharge with a foul odor. She denies urinary symptoms, h/a, dizziness, n/v, or fever/chills.    Past Medical History:  Diagnosis Date  . Abnormal Pap smear    colpo with biopsy at age 20  . Bipolar 1 disorder ([place])   . Infertility    able to become pregnant with Clomid  . Low iron   . Migraine   . Migraine   . PTSD (post-traumatic stress disorder)    seeing neuropsychologist   Past Surgical History:  Procedure Laterality Date  . NO PAST SURGERIES     Social History   Socioeconomic History  . Marital status: Legally Separated    Spouse name: Not on file  . Number of children: 1  . Years of education: College  . Highest education level: Not on file  Occupational History    Comment: N/a  Social Needs  . Financial resource strain: Not on file  . Food insecurity:    Worry: Not on file    Inability: Not on file  . Transportation needs:    Medical: Not on file    Non-medical: Not on file  Tobacco Use  . Smoking status: Never Smoker  . Smokeless tobacco: Never Used  Substance and Sexual Activity  . Alcohol  use: No  . Drug use: No  . Sexual activity: Yes    Birth control/protection: None  Lifestyle  . Physical activity:    Days per week: Not on file    Minutes per session: Not on file  . Stress: Not on file  Relationships  . Social connections:    Talks on phone: Not on file    Gets together: Not on file    Attends religious service: Not on file    Active member of club or organization: Not on file    Attends meetings of clubs or organizations: Not on file    Relationship status: Not on file  . Intimate partner violence:    Fear of current or ex partner: Not on file    Emotionally abused: Not on file    Physically abused: Not on file    Forced sexual activity: Not on file  Other Topics Concern  . Not on file  Social History Narrative   Patient lives at home with family.   Caffeine Use: 1 soda weekly   No current facility-administered medications on file prior to encounter.    Current Outpatient Medications on File Prior to Encounter  Medication Sig Dispense Refill  . cyclobenzaprine (FLEXERIL) 10 MG tablet Take 1 tablet (10 mg total) by mouth 2 (two) times daily as needed for  muscle spasms. 20 tablet 0  . diphenhydrAMINE (BENADRYL) 50 MG tablet Take 1 tablet (50 mg total) at bedtime as needed by mouth for itching. 30 tablet 0  . docusate sodium (COLACE) 100 MG capsule Take 1 capsule (100 mg total) by mouth 2 (two) times daily as needed for mild constipation (as needed for constipation). 20 capsule 0  . ibuprofen (ADVIL,MOTRIN) 400 MG tablet Take 1 tablet (400 mg total) by mouth every 8 (eight) hours as needed for moderate pain. 21 tablet 0  . Levonorgestrel (LILETTA, 52 MG, IU) 1 application by Intrauterine route once. Since June 2017    . omeprazole (PRILOSEC) 20 MG capsule Take 1 capsule (20 mg total) by mouth daily. 20 capsule 1  . ondansetron (ZOFRAN ODT) 4 MG disintegrating tablet Take 1 tablet (4 mg total) by mouth every 8 (eight) hours as needed. 10 tablet 0  . ondansetron  (ZOFRAN) 4 MG tablet Take 1 tablet (4 mg total) by mouth every 8 (eight) hours as needed for nausea or vomiting. 20 tablet 0  . oxyCODONE-acetaminophen (PERCOCET) 10-325 MG tablet Take 1 tablet by mouth every 8 (eight) hours as needed for pain. 20 tablet 0  . polyethylene glycol powder (GLYCOLAX/MIRALAX) powder Take 17 g by mouth 2 (two) times daily as needed. 3350 g 1  . traMADol (ULTRAM) 50 MG tablet Take 1 tablet (50 mg total) by mouth every 8 (eight) hours as needed. 28 tablet 0   Allergies  Allergen Reactions  . Dilaudid [Hydromorphone Hcl]     Upset stomach  . Phenergan [Promethazine Hcl] Diarrhea  . Codeine Rash  . Penicillins Rash  . Sulfa Antibiotics Rash    ROS:  Review of Systems  Respiratory: Negative.   Cardiovascular: Negative.   Gastrointestinal: Negative for constipation, diarrhea, nausea and vomiting.       Abdominal bloating and cramping  Genitourinary: Positive for pelvic pain and vaginal discharge. Negative for difficulty urinating, dysuria, frequency, urgency and vaginal bleeding.  Musculoskeletal: Negative.    I have reviewed patient's Past Medical Hx, Surgical Hx, Family Hx, Social Hx, medications and allergies.   Physical Exam   Patient Vitals for the past 24 hrs:  BP Temp Pulse Resp SpO2 Height Weight  08/16/17 2309 140/90 - 76 17 - - -  08/16/17 2132 (!) 147/91 98.8 F (37.1 C) 68 19 99 % 5\' 7"  (1.702 m) 139 lb 8 oz (63.3 kg)   Constitutional: Well-developed, well-nourished female in no acute distress.  Cardiovascular: normal rate Respiratory: normal effort GI: Abd soft, non-tender. Pos BS x 4 MS: Extremities nontender, no edema, normal ROM Neurologic: Alert and oriented x 4.  GU: Neg CVAT.  PELVIC EXAM: Cervix pink, visually closed, without lesion, scant yellow creamy discharge with foul odor present- IUD string seen in place at 45mm tucked behind cervical os - string moved so that patient is able to feel strings, vaginal walls and external  genitalia normal Bimanual exam: Cervix 0/long/high, firm, anterior, neg CMT, uterus nontender, nonenlarged, adnexa without tenderness, enlargement, or mass  LAB RESULTS Results for orders placed or performed during the hospital encounter of 08/16/17 (from the past 24 hour(s))  Urinalysis, Routine w reflex microscopic     Status: Abnormal   Collection Time: 08/16/17  9:40 PM  Result Value Ref Range   Color, Urine STRAW (A) YELLOW   APPearance CLEAR CLEAR   Specific Gravity, Urine 1.005 1.005 - 1.030   pH 6.0 5.0 - 8.0   Glucose, UA NEGATIVE NEGATIVE mg/dL  Hgb urine dipstick NEGATIVE NEGATIVE   Bilirubin Urine NEGATIVE NEGATIVE   Ketones, ur NEGATIVE NEGATIVE mg/dL   Protein, ur NEGATIVE NEGATIVE mg/dL   Nitrite NEGATIVE NEGATIVE   Leukocytes, UA NEGATIVE NEGATIVE  Pregnancy, urine POC     Status: None   Collection Time: 08/16/17  9:47 PM  Result Value Ref Range   Preg Test, Ur NEGATIVE NEGATIVE  Wet prep, genital     Status: Abnormal   Collection Time: 08/16/17 10:12 PM  Result Value Ref Range   Yeast Wet Prep HPF POC NONE SEEN NONE SEEN   Trich, Wet Prep NONE SEEN NONE SEEN   Clue Cells Wet Prep HPF POC PRESENT (A) NONE SEEN   WBC, Wet Prep HPF POC MANY (A) NONE SEEN   Sperm NONE SEEN     MAU Management/MDM: Orders Placed This Encounter  Procedures  . Wet prep, genital  . Urinalysis, Routine w reflex microscopic  . Pregnancy, urine POC  . Discharge patient Discharge disposition: 01-Home or Self Care; Discharge patient date: 08/16/2017   Wet prep- positive for clue cells  UA- negative  UPT- negative  GC/C- pending   Meds ordered this encounter  Medications  . ketorolac (TORADOL) injection 60 mg  . tinidazole (TINDAMAX) 500 MG tablet    Sig: Take 4 tablets (2,000 mg total) by mouth daily with breakfast. For two days    Dispense:  8 tablet    Refill:  0    Order Specific Question:   Supervising Provider    Answer:   Woodroe Mode [[id number]]   Treatments in MAU  included Toradol 60mg  IM for pelvic pain- patient reports pain is relieved with medication treatment. Discussed results with patient and POC. Pt discharged. Rx for Tindamax 2 day treatment for BV.   ASSESSMENT 1. BV (bacterial vaginosis)   2. Vaginal discharge   3. IUD (intrauterine device) in place   4. Pelvic pain     PLAN Discharge home Follow up as scheduled with PCP  Discussed reasons to return to MAU  Rx for Tindamax   Follow-up Information    Diallo, Earna Coder, MD Follow up.   Specialty:  Family Medicine Why:  Follow up with PCP for worsening symptoms and annual examination  Contact information: [address] [place] [address] [phone]           Allergies as of 08/16/2017      Reactions   Dilaudid [hydromorphone Hcl]    Upset stomach   Phenergan [promethazine Hcl] Diarrhea   Codeine Rash   Penicillins Rash   Sulfa Antibiotics Rash      Medication List    TAKE these medications   cyclobenzaprine 10 MG tablet Commonly known as:  FLEXERIL Take 1 tablet (10 mg total) by mouth 2 (two) times daily as needed for muscle spasms.   diphenhydrAMINE 50 MG tablet Commonly known as:  BENADRYL Take 1 tablet (50 mg total) at bedtime as needed by mouth for itching.   docusate sodium 100 MG capsule Commonly known as:  COLACE Take 1 capsule (100 mg total) by mouth 2 (two) times daily as needed for mild constipation (as needed for constipation).   ibuprofen 400 MG tablet Commonly known as:  ADVIL,MOTRIN Take 1 tablet (400 mg total) by mouth every 8 (eight) hours as needed for moderate pain.   LILETTA (52 MG) IU 1 application by Intrauterine route once. Since June 2017   omeprazole 20 MG capsule Commonly known as:  PRILOSEC Take 1  capsule (20 mg total) by mouth daily.   ondansetron 4 MG disintegrating tablet Commonly known as:  ZOFRAN ODT Take 1 tablet (4 mg total) by mouth every 8 (eight) hours as needed.   ondansetron 4 MG tablet Commonly known as:   ZOFRAN Take 1 tablet (4 mg total) by mouth every 8 (eight) hours as needed for nausea or vomiting.   oxyCODONE-acetaminophen 10-325 MG tablet Commonly known as:  PERCOCET Take 1 tablet by mouth every 8 (eight) hours as needed for pain.   polyethylene glycol powder powder Commonly known as:  GLYCOLAX/MIRALAX Take 17 g by mouth 2 (two) times daily as needed.   tinidazole 500 MG tablet Commonly known as:  TINDAMAX Take 4 tablets (2,000 mg total) by mouth daily with breakfast. For two days   traMADol 50 MG tablet Commonly known as:  ULTRAM Take 1 tablet (50 mg total) by mouth every 8 (eight) hours as needed.      Darrol Poke  Certified Nurse-Midwife 08/16/2017  10:59 PM

## 2017-08-17 ENCOUNTER — Encounter (INDEPENDENT_AMBULATORY_CARE_PROVIDER_SITE_OTHER): Payer: Self-pay

## 2017-08-17 LAB — GC/CHLAMYDIA PROBE AMP (~~LOC~~) NOT AT ARMC
Chlamydia: NEGATIVE
Neisseria Gonorrhea: NEGATIVE

## 2017-08-28 IMAGING — MG MM DIAG BREAST TOMO BILATERAL
6 of 10 series · 6 of 26 positions shown · non-contrast
Comparison: Baseline study

CLINICAL DATA: Patient reports palpable abnormality associated with
tenderness in the upper-outer quadrant left breast.

EXAM:
DIGITAL DIAGNOSTIC BILATERAL MAMMOGRAM WITH 3D TOMOSYNTHESIS WITH
CAD
ULTRASOUND LEFT BREAST

[L TAN]
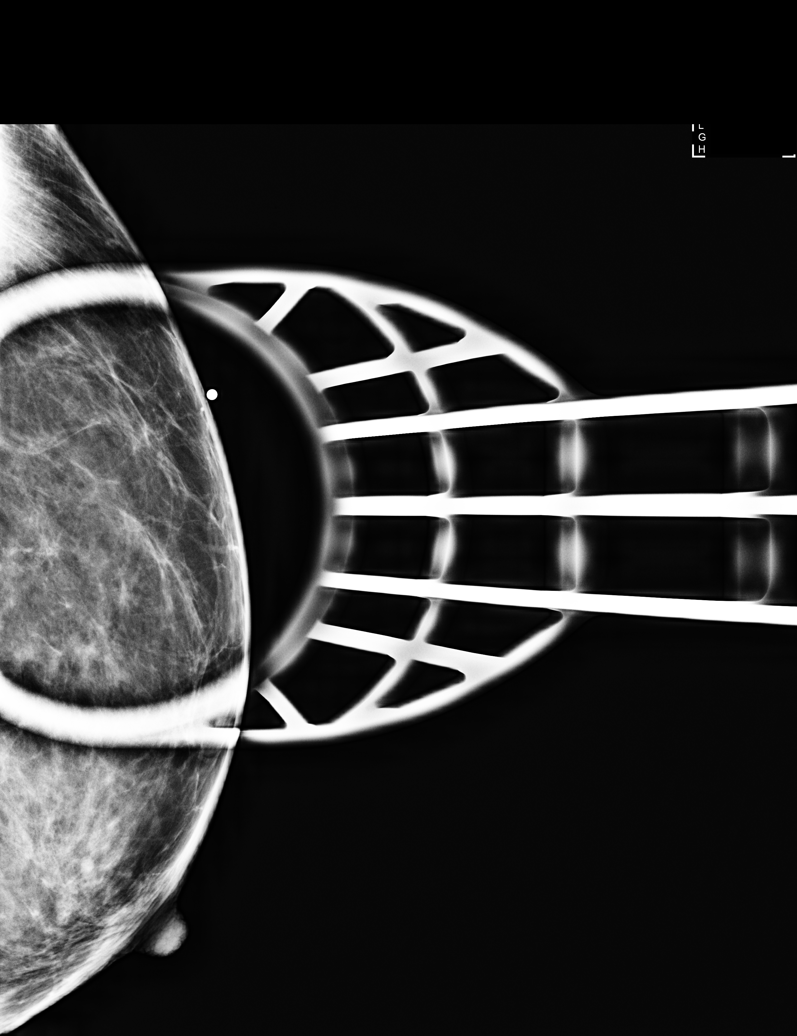

[R MLO (1 of 2)]
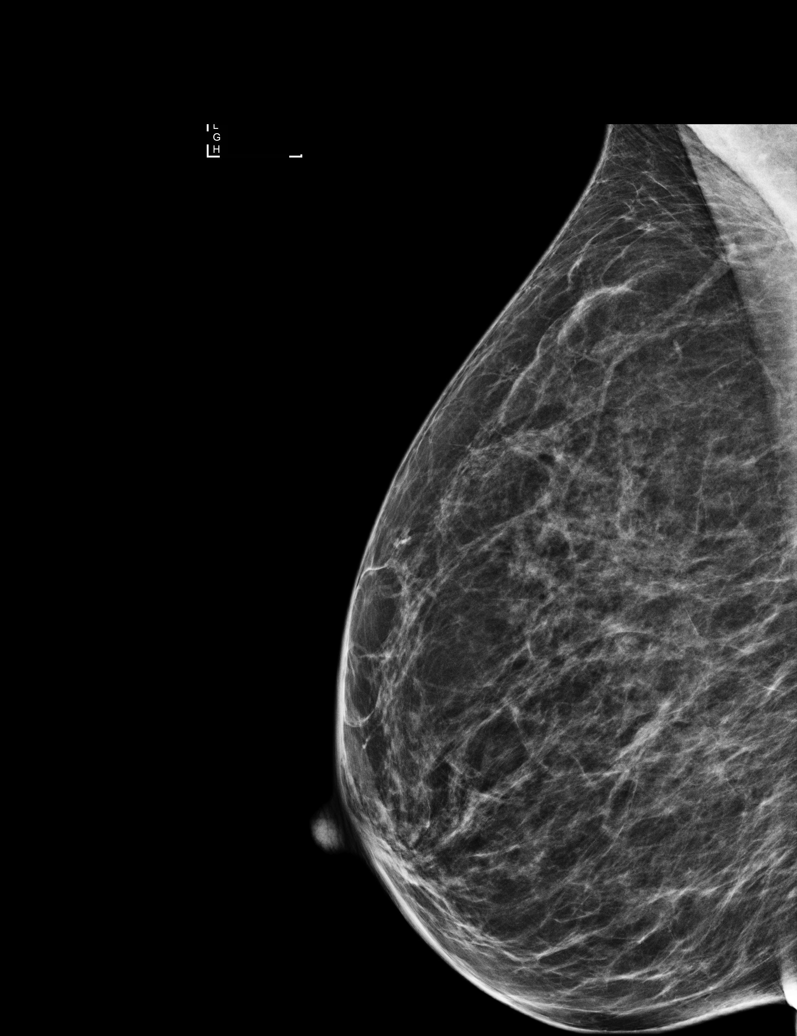

[R MLO (2 of 2)]
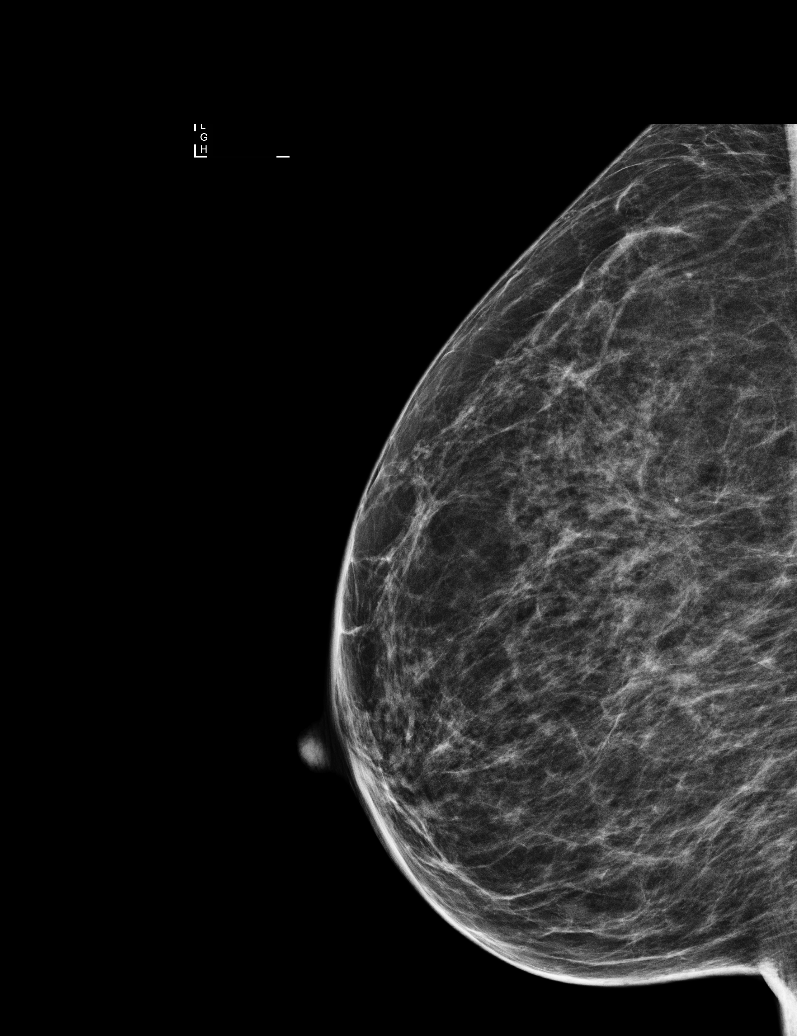

[L CC]
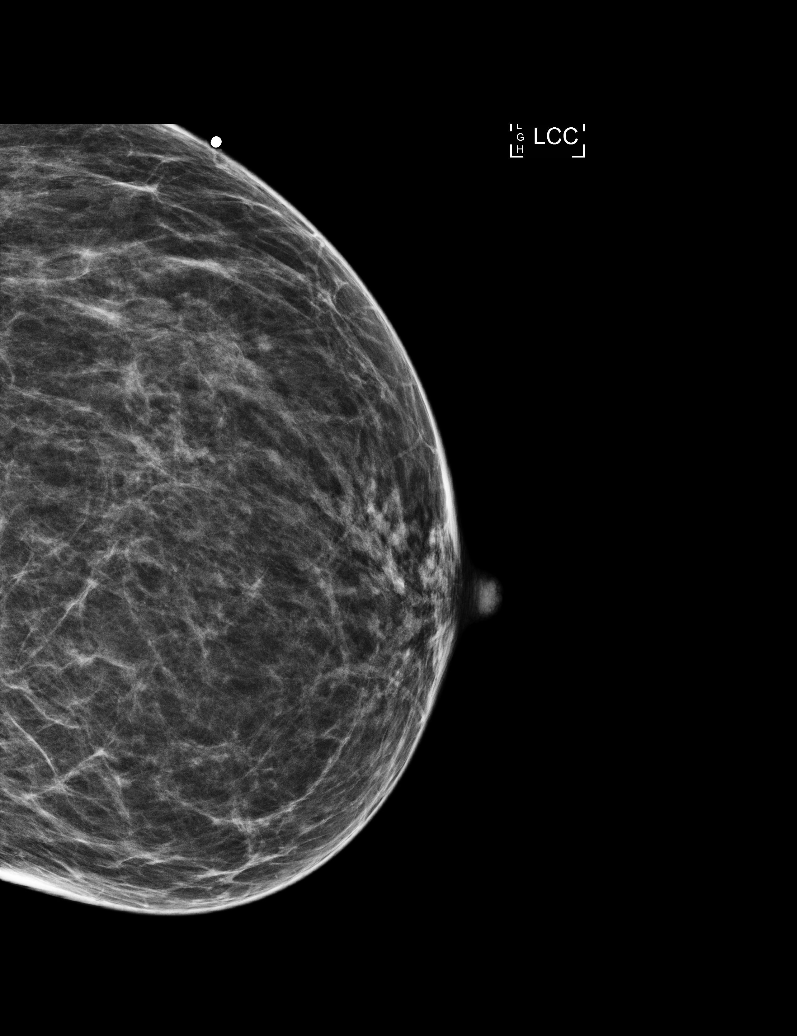

[R CC]
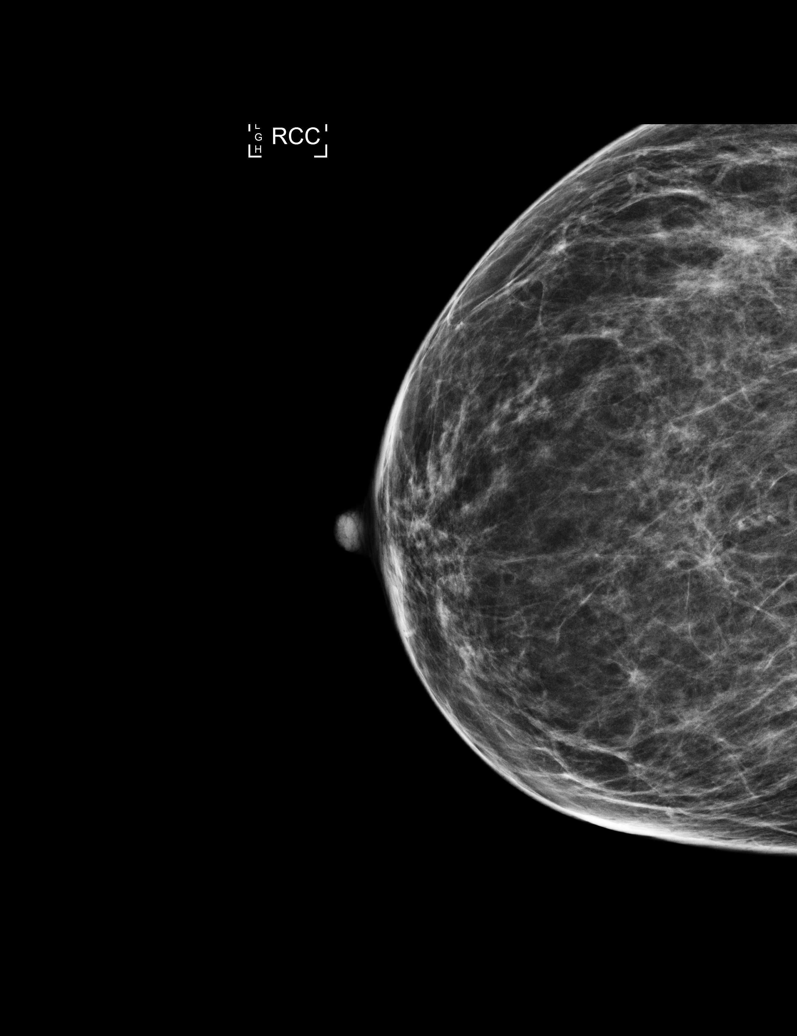

[L MLO]
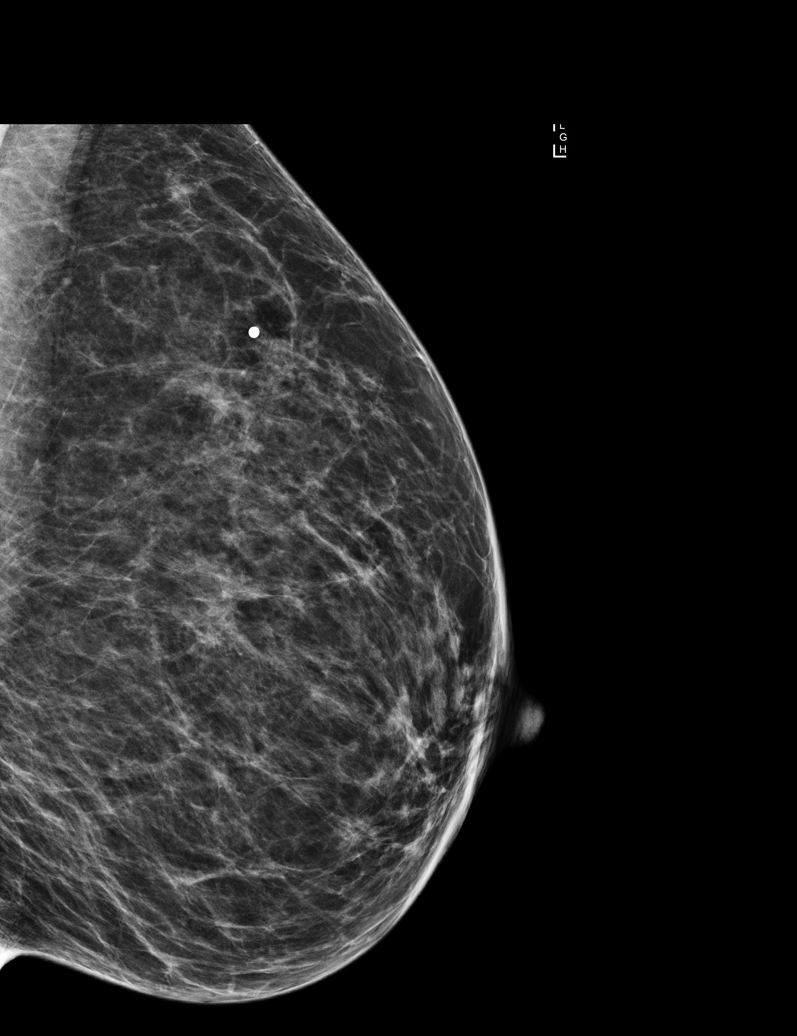

[6 of 26 positions shown; findings below may reference images not displayed]

ACR Breast Density Category b: There are scattered areas of
fibroglandular density.
FINDINGS: No suspicious mass, distortion, or microcalcifications are
identified to suggest presence of malignancy. Spot tangential view
in the area concern is negative.

Mammographic images were processed with CAD.

On physical exam, I palpate soft areas of thickening in the
upper-outer quadrant of the left breast. I palpate no discrete mass.

Targeted ultrasound is performed, showing normal appearing
fibroglandular tissue throughout the upper outer quadrant of the
left breast. No mass, distortion, or acoustic shadowing is
demonstrated with ultrasound.
IMPRESSION: No mammographic or ultrasound evidence for malignancy.

RECOMMENDATION:
Screening mammogram in one year.(Code:06-K-IY8)

I have discussed the findings and recommendations with the patient.
Results were also provided in writing at the conclusion of the
visit. If applicable, a reminder letter will be sent to the patient
regarding the next appointment.

BI-RADS CATEGORY  1: Negative.

## 2017-09-21 ENCOUNTER — Encounter

## 2017-12-06 ENCOUNTER — Emergency Department (HOSPITAL_COMMUNITY)
Admission: EM | Admit: 2017-12-06 | Discharge: 2017-12-06 | Disposition: A | Discharge status: Home / Self Care | Payer: No Typology Code available for payment source | Attending: Emergency Medicine | Admitting: Emergency Medicine

## 2017-12-06 ENCOUNTER — Emergency Department (HOSPITAL_COMMUNITY): Payer: No Typology Code available for payment source

## 2017-12-06 ENCOUNTER — Encounter (HOSPITAL_COMMUNITY): Payer: Self-pay | Admitting: Emergency Medicine

## 2017-12-06 DIAGNOSIS — Z79899 Other long term (current) drug therapy: Secondary | ICD-10-CM | POA: Insufficient documentation

## 2017-12-06 DIAGNOSIS — M25512 Pain in left shoulder: Secondary | ICD-10-CM

## 2017-12-06 MED ORDER — DICLOFENAC SODIUM 75 MG PO TBEC: 75.0000 mg | DELAYED_RELEASE_TABLET | Freq: Two times a day (BID) | ORAL | 0 refills | Status: DC

## 2017-12-06 NOTE — ED Notes (Signed)
Pt verbalized understanding of d/c instructions and has no further questions, given referral to ortho. VSS, NAD.
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
## 2017-12-06 NOTE — Discharge Instructions (Addendum)
Return if any problems.
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
## 2017-12-10 ENCOUNTER — Other Ambulatory Visit: Payer: Self-pay | Admitting: Chiropractor

## 2017-12-10 DIAGNOSIS — G8929 Other chronic pain: Secondary | ICD-10-CM

## 2017-12-10 DIAGNOSIS — M25512 Pain in left shoulder: Secondary | ICD-10-CM

## 2017-12-10 DIAGNOSIS — M2428 Disorder of ligament, vertebrae: Secondary | ICD-10-CM

## 2017-12-16 ENCOUNTER — Ambulatory Visit
Admission: RE | Admit: 2017-12-16 | Discharge: 2017-12-16 | Disposition: A | Discharge status: Home / Self Care | Payer: Self-pay | Source: Ambulatory Visit | Attending: Chiropractor | Admitting: Chiropractor

## 2017-12-16 DIAGNOSIS — M2428 Disorder of ligament, vertebrae: Secondary | ICD-10-CM

## 2017-12-16 DIAGNOSIS — G8929 Other chronic pain: Secondary | ICD-10-CM

## 2017-12-16 DIAGNOSIS — M25512 Pain in left shoulder: Principal | ICD-10-CM

## 2018-01-06 ENCOUNTER — Other Ambulatory Visit: Payer: Self-pay

## 2018-01-06 ENCOUNTER — Encounter (HOSPITAL_COMMUNITY): Payer: Self-pay | Admitting: Emergency Medicine

## 2018-01-06 ENCOUNTER — Emergency Department (HOSPITAL_COMMUNITY)
Admission: EM | Admit: 2018-01-06 | Discharge: 2018-01-06 | Disposition: A | Discharge status: Home / Self Care | Payer: No Typology Code available for payment source | Attending: Emergency Medicine | Admitting: Emergency Medicine

## 2018-01-06 ENCOUNTER — Emergency Department (HOSPITAL_COMMUNITY): Payer: No Typology Code available for payment source

## 2018-01-06 DIAGNOSIS — Z79899 Other long term (current) drug therapy: Secondary | ICD-10-CM | POA: Insufficient documentation

## 2018-01-06 DIAGNOSIS — M5412 Radiculopathy, cervical region: Secondary | ICD-10-CM

## 2018-01-06 DIAGNOSIS — M542 Cervicalgia: Secondary | ICD-10-CM

## 2018-01-06 MED ORDER — PREDNISONE 10 MG (21) PO TBPK: ORAL_TABLET | Freq: Every day | ORAL | 0 refills | Status: DC
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
## 2018-01-06 NOTE — Discharge Instructions (Signed)
CT today was reassuring.  Take prednisone as prescribed.  Follow-up with your orthopedist within a week for continued evaluation.  Return to the ED immediately for new or worsening symptoms, weakness, fevers, neck stiffness or any concerns at all.  Per your request contact information for a neurosurgeon in the area is attached to your discharge paperwork.

## 2018-01-06 NOTE — ED Triage Notes (Signed)
Pt here for evaluation of neck pain, reports nausea, dizziness with the pain. Pt alert, oriented, appropriate, VSS. Strength equal, neuro intact.

## 2018-03-30 ENCOUNTER — Other Ambulatory Visit (HOSPITAL_COMMUNITY): Payer: Self-pay | Admitting: *Deleted

## 2018-03-30 DIAGNOSIS — N6310 Unspecified lump in the right breast, unspecified quadrant: Secondary | ICD-10-CM

## 2018-04-19 ENCOUNTER — Other Ambulatory Visit: Payer: Self-pay

## 2018-04-19 ENCOUNTER — Ambulatory Visit (HOSPITAL_COMMUNITY)
Admission: RE | Admit: 2018-04-19 | Discharge: 2018-04-19 | Disposition: A | Discharge status: Home / Self Care | Payer: No Typology Code available for payment source | Source: Ambulatory Visit | Attending: Obstetrics and Gynecology | Admitting: Obstetrics and Gynecology

## 2018-04-19 ENCOUNTER — Encounter (HOSPITAL_COMMUNITY): Payer: Self-pay

## 2018-04-19 VITALS — BP 122/94 | Wt 142.0 lb

## 2018-04-19 DIAGNOSIS — N644 Mastodynia: Secondary | ICD-10-CM

## 2018-04-19 DIAGNOSIS — Z1239 Encounter for other screening for malignant neoplasm of breast: Secondary | ICD-10-CM

## 2018-04-19 NOTE — Patient Instructions (Signed)
Explained breast self awareness with Vicki Mack. Patient did not need a Pap smear today due to last Pap smear and HPV typing was 10/04/2013. Let her know BCCCP will cover Pap smears and HPV typing every 5 years unless has a history of abnormal Pap smears. Referred patient to the [facility] for a diagnostic mammogram. Appointment scheduled for Friday, April 22, 2018 at 0910. Patient aware of appointment and will be there. [name] verbalized understanding.  [place]ann[place], Arvil Chaco, RN 8:04 AM

## 2018-04-19 NOTE — Progress Notes (Signed)
Complaints of right upper breast pain x 3 months that is constant. Patient rates the pain at a 8 out of 10. Patient complained that her right breast has increased in size over the past 6-7 months. Patient states she thinks she has felt a right breast lump x 6-7 months and there is a sore between her breasts.  Pap Smear: Pap smear not completed today. Last Pap smear was 10/04/2013 at the [facility] and normal with negative HPV. Per patient has a history of an abnormal Pap smear in the early 1990's that a colposcopy was completed for follow-up. Patient stated the colposcopy was negative and that she has had more than three normal Pap smears since the colposcopy. Last Pap smear result is in Epic.  Physical exam: Breasts Breasts symmetrical. No skin abnormalities bilateral breasts. No open sores observed on exam. No nipple retraction bilateral breasts. No nipple discharge bilateral breasts. No lymphadenopathy. No lumps palpated bilateral breasts. Unable to palpated a breast lump in patients area of concern within the right breast. Complaints of right upper breast tenderness on exam. Referred patient to the [facility] for a diagnostic mammogram. Appointment scheduled for Friday, April 22, 2018 at 0910.        Pelvic/Bimanual No Pap smear completed today since last Pap smear and HPV typing was 10/04/2013. Pap smear not indicated per BCCCP guidelines.   Smoking History: Patient has never smoked.  Patient Navigation: Patient education provided. Access to services provided for patient through BCCCP program.   Breast and Cervical Cancer Risk Assessment: Patient has a family history of a paternal aunt having breast cancer. Patient has no known genetic mutations or history of radiation treatment to the chest before age 49. Per patient has a history of cervical dysplasia. Patient has no history of being immunocompromised or DES exposure in-utero.  Risk  Assessment    Risk Scores      04/19/2018   Last edited by: Armond Hang, LPN   5-year risk: 0.8 %   Lifetime risk: 9.4 %

## 2018-04-22 ENCOUNTER — Ambulatory Visit
Admission: RE | Admit: 2018-04-22 | Discharge: 2018-04-22 | Disposition: A | Discharge status: Home / Self Care | Payer: Self-pay | Source: Ambulatory Visit | Attending: Obstetrics and Gynecology | Admitting: Obstetrics and Gynecology

## 2018-04-22 ENCOUNTER — Ambulatory Visit
Admission: RE | Admit: 2018-04-22 | Discharge: 2018-04-22 | Disposition: A | Discharge status: Home / Self Care | Payer: PRIVATE HEALTH INSURANCE | Source: Ambulatory Visit | Attending: Obstetrics and Gynecology | Admitting: Obstetrics and Gynecology

## 2018-04-22 DIAGNOSIS — N6310 Unspecified lump in the right breast, unspecified quadrant: Secondary | ICD-10-CM

## 2018-05-03 ENCOUNTER — Other Ambulatory Visit: Payer: Self-pay

## 2018-05-03 ENCOUNTER — Ambulatory Visit (INDEPENDENT_AMBULATORY_CARE_PROVIDER_SITE_OTHER): Payer: Self-pay | Admitting: Family Medicine

## 2018-05-03 VITALS — BP 118/90 | HR 104 | Temp 98.4°F | Wt 142.0 lb

## 2018-05-03 DIAGNOSIS — J069 Acute upper respiratory infection, unspecified: Secondary | ICD-10-CM | POA: Insufficient documentation

## 2018-05-03 DIAGNOSIS — B9789 Other viral agents as the cause of diseases classified elsewhere: Secondary | ICD-10-CM

## 2018-05-03 NOTE — Patient Instructions (Addendum)
It was nice meeting you today Ms. Vicki Mack!  Have an upper respiratory viral infection that should get better with time.  Please use Zyrtec-D and Tylenol for your symptoms.  Please stay at home as much as you can and wash her hands frequently.    Happy birthday, and I hope you feel better soon  If you have any questions or concerns, please feel free to call the clinic.   Be well,  Dr. Shan Levans

## 2018-05-03 NOTE — Assessment & Plan Note (Signed)
Patient appears to have a viral URI that is not currently concerning for COVID-19 because she is afebrile and has no recorded fevers.  She was recommended to take Tylenol and Zyrtec-D for her symptoms.  She was given return precautions, including shortness of breath and temperatures greater than 100.4.  She was told that she will likely need to be tested for COVID-19 rather than come to our clinic if she develops the symptoms.

## 2018-05-03 NOTE — Progress Notes (Signed)
   Subjective:    Vicki Mack - 43 y.o. female MRN [id number]  Date of birth: 1975/03/20  CC:  Vicki Mack is here for runny nose, cough, and sore throat.  HPI: - developed cough on Sunday - runny nose, sore throat, chills since Sunday - had some shortness of breath today when walking back from the store, but this has not occurred at any other time - has some aching pain in her chest when she coughs - has tried many OTC remedies -Denies any recorded fevers at home -Has not taken Tylenol prior to her appointment today  Health Maintenance:  Health Maintenance Due  Topic Date Due  . TETANUS/TDAP  05/02/1992  . PAP SMEAR-Modifier  10/04/2016    -  reports that she has never smoked. She has never used smokeless tobacco. - Review of Systems: Per HPI. - Past Medical History: Patient Active Problem List   Diagnosis Date Noted  . Viral URI with cough 05/03/2018  . Migraine headache without aura 08/27/2011  . SAB (spontaneous abortion) 08/27/2011   - Medications: reviewed and updated   Objective:   Physical Exam BP 118/90   Pulse (!) 104   Temp 98.4 F (36.9 C) (Oral)   Wt 142 lb (64.4 kg)   SpO2 99%   BMI 22.24 kg/m  Gen: NAD, alert, cooperative with exam, well-appearing HEENT: NCAT, PERRL, clear conjunctiva, oropharynx clear, supple neck CV: RRR, good S1/S2, no murmur, no edema, capillary refill brisk  Resp: CTABL, no wheezes, non-labored Abd: SNTND, BS present, no guarding or organomegaly Skin: no rashes, normal turgor  Neuro: no gross deficits.  Psych: good insight, alert and oriented        Assessment & Plan:   Viral URI with cough Patient appears to have a viral URI that is not currently concerning for COVID-19 because she is afebrile and has no recorded fevers.  She was recommended to take Tylenol and Zyrtec-D for her symptoms.  She was given return precautions, including shortness of breath and temperatures greater than 100.4.  She was  told that she will likely need to be tested for COVID-19 rather than come to our clinic if she develops the symptoms.    Maia Breslow, M.D. 05/03/2018, 4:12 PM PGY-2, [place]

## 2018-05-04 ENCOUNTER — Encounter (HOSPITAL_COMMUNITY): Payer: Self-pay | Admitting: *Deleted

## 2018-09-30 ENCOUNTER — Emergency Department (HOSPITAL_COMMUNITY)
Admission: EM | Admit: 2018-09-30 | Discharge: 2018-09-30 | Disposition: A | Discharge status: Home / Self Care | Payer: PRIVATE HEALTH INSURANCE | Attending: Emergency Medicine | Admitting: Emergency Medicine

## 2018-09-30 ENCOUNTER — Other Ambulatory Visit: Payer: Self-pay

## 2018-09-30 ENCOUNTER — Encounter (HOSPITAL_COMMUNITY): Payer: Self-pay

## 2018-09-30 ENCOUNTER — Emergency Department (HOSPITAL_COMMUNITY): Payer: PRIVATE HEALTH INSURANCE

## 2018-09-30 DIAGNOSIS — R51 Headache: Secondary | ICD-10-CM | POA: Insufficient documentation

## 2018-09-30 DIAGNOSIS — R200 Anesthesia of skin: Secondary | ICD-10-CM

## 2018-09-30 DIAGNOSIS — R0602 Shortness of breath: Secondary | ICD-10-CM | POA: Insufficient documentation

## 2018-09-30 DIAGNOSIS — M542 Cervicalgia: Secondary | ICD-10-CM | POA: Insufficient documentation

## 2018-09-30 DIAGNOSIS — R072 Precordial pain: Secondary | ICD-10-CM

## 2018-09-30 LAB — COMPREHENSIVE METABOLIC PANEL
ALT: 11 U/L (ref 0–44)
AST: 17 U/L (ref 15–41)
Albumin: 3.6 g/dL (ref 3.5–5.0)
Alkaline Phosphatase: 65 U/L (ref 38–126)
Anion gap: 8 (ref 5–15)
BUN: <5 mg/dL — ABNORMAL LOW (ref 6–20)
CO2: 22 mmol/L (ref 22–32)
Calcium: 8.6 mg/dL — ABNORMAL LOW (ref 8.9–10.3)
Chloride: 106 mmol/L (ref 98–111)
Creatinine, Ser: 0.77 mg/dL (ref 0.44–1.00)
GFR calc Af Amer: >60 mL/min (ref >60)
GFR calc non Af Amer: >60 mL/min (ref >60)
Glucose, Bld: 88 mg/dL (ref 70–99)
Potassium: 4.2 mmol/L (ref 3.5–5.1)
Sodium: 136 mmol/L (ref 135–145)
Total Bilirubin: 0.6 mg/dL (ref 0.3–1.2)
Total Protein: 6.7 g/dL (ref 6.5–8.1)

## 2018-09-30 LAB — CBC WITH DIFFERENTIAL/PLATELET
Abs Immature Granulocytes: 0.03 10*3/uL (ref 0.00–0.07)
Basophils Absolute: 0.1 10*3/uL (ref 0.0–0.1)
Basophils Relative: 1 %
Eosinophils Absolute: 0.2 10*3/uL (ref 0.0–0.5)
Eosinophils Relative: 2 %
HCT: 40.7 % (ref 36.0–46.0)
Hemoglobin: 13.1 g/dL (ref 12.0–15.0)
Immature Granulocytes: 0 %
Lymphocytes Relative: 21 %
Lymphs Abs: 2.2 10*3/uL (ref 0.7–4.0)
MCH: 28.1 pg (ref 26.0–34.0)
MCHC: 32.2 g/dL (ref 30.0–36.0)
MCV: 87.2 fL (ref 80.0–100.0)
Monocytes Absolute: 0.8 10*3/uL (ref 0.1–1.0)
Monocytes Relative: 8 %
Neutro Abs: 7.2 10*3/uL (ref 1.7–7.7)
Neutrophils Relative %: 68 %
Platelets: 249 10*3/uL (ref 150–400)
RBC: 4.67 MIL/uL (ref 3.87–5.11)
RDW: 14.2 % (ref 11.5–15.5)
WBC: 10.5 10*3/uL (ref 4.0–10.5)
nRBC: 0 % (ref 0.0–0.2)

## 2018-09-30 LAB — URINALYSIS, ROUTINE W REFLEX MICROSCOPIC
Bilirubin Urine: NEGATIVE
Glucose, UA: NEGATIVE mg/dL
Hgb urine dipstick: NEGATIVE
Ketones, ur: NEGATIVE mg/dL
Leukocytes,Ua: NEGATIVE
Nitrite: NEGATIVE
Protein, ur: NEGATIVE mg/dL
Specific Gravity, Urine: 1.006 (ref 1.005–1.030)
pH: 7 (ref 5.0–8.0)

## 2018-09-30 LAB — POC URINE PREG, ED: Preg Test, Ur: NEGATIVE

## 2018-09-30 LAB — LACTIC ACID, PLASMA
Lactic Acid, Venous: 1 mmol/L (ref 0.5–1.9)
Lactic Acid, Venous: 1.3 mmol/L (ref 0.5–1.9)

## 2018-09-30 LAB — LIPASE, BLOOD: Lipase: 27 U/L (ref 11–51)

## 2018-09-30 LAB — PROTIME-INR
INR: 1.1 (ref 0.8–1.2)
Prothrombin Time: 14 seconds (ref 11.4–15.2)

## 2018-09-30 LAB — TROPONIN I (HIGH SENSITIVITY)
Troponin I (High Sensitivity): <2 ng/L (ref <18)
Troponin I (High Sensitivity): 4 ng/L (ref <18)

## 2018-09-30 MED ORDER — IOHEXOL 350 MG/ML SOLN
100.0000 mL | Freq: Once | INTRAVENOUS | Status: AC | PRN
  Administered 2018-09-30: 18:00:00 100 mL via INTRAVENOUS

## 2018-09-30 NOTE — ED Triage Notes (Signed)
Pt reports having chest pain for the last three days intermittently, with back ache and headache. Pt is AOx4, afebrile, 97%ra, 82pulse, 125/92BP

## 2018-09-30 NOTE — ED Notes (Signed)
Patient transported to CT 
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
## 2018-09-30 NOTE — Discharge Instructions (Signed)
Your work-up today was overall reassuring and we did not see evidence of acute cardiac injury or cause of your pain, no evidence of a dissection or other problem with your aorta and we did see the known 2 mm aneurysm that you have had in the past.  No evidence of bleeding in your head causing her symptoms.  Given your improved chest pain and your reassuring labs, we feel you are safe for discharge home.  We discussed together getting an MRI for the numbness however as your symptoms have been waxing and waning and improving, we agree that you are safe for discharge home.  Please follow-up with your primary doctor and your neurosurgeon for further management.  If any symptoms change or worsen, please return to the nearest emergency department.

## 2018-10-01 LAB — URINE CULTURE: Culture: NO GROWTH

## 2018-10-05 ENCOUNTER — Emergency Department (HOSPITAL_COMMUNITY)
Admission: EM | Admit: 2018-10-05 | Discharge: 2018-10-05 | Disposition: A | Discharge status: Home / Self Care | Payer: Self-pay | Attending: Emergency Medicine | Admitting: Emergency Medicine

## 2018-10-05 ENCOUNTER — Encounter (HOSPITAL_COMMUNITY): Payer: Self-pay | Admitting: Emergency Medicine

## 2018-10-05 ENCOUNTER — Other Ambulatory Visit: Payer: Self-pay

## 2018-10-05 DIAGNOSIS — R200 Anesthesia of skin: Secondary | ICD-10-CM | POA: Insufficient documentation

## 2018-10-05 DIAGNOSIS — M79651 Pain in right thigh: Secondary | ICD-10-CM | POA: Insufficient documentation

## 2018-10-05 DIAGNOSIS — G43109 Migraine with aura, not intractable, without status migrainosus: Secondary | ICD-10-CM | POA: Insufficient documentation

## 2018-10-05 DIAGNOSIS — Z79899 Other long term (current) drug therapy: Secondary | ICD-10-CM | POA: Insufficient documentation

## 2018-10-05 MED ORDER — KETOROLAC TROMETHAMINE 30 MG/ML IJ SOLN
30.0000 mg | Freq: Once | INTRAMUSCULAR | Status: AC
  Administered 2018-10-05: 30 mg via INTRAVENOUS
  Filled 2018-10-05: qty 1

## 2018-10-05 MED ORDER — DIPHENHYDRAMINE HCL 50 MG/ML IJ SOLN
12.5000 mg | Freq: Once | INTRAMUSCULAR | Status: AC
  Administered 2018-10-05: 12.5 mg via INTRAVENOUS
  Filled 2018-10-05: qty 1

## 2018-10-05 MED ORDER — SODIUM CHLORIDE 0.9 % IV BOLUS
1000.0000 mL | Freq: Once | INTRAVENOUS | Status: AC
  Administered 2018-10-05: 1000 mL via INTRAVENOUS

## 2018-10-05 MED ORDER — METOCLOPRAMIDE HCL 5 MG/ML IJ SOLN
10.0000 mg | Freq: Once | INTRAMUSCULAR | Status: AC
  Administered 2018-10-05: 10 mg via INTRAVENOUS
  Filled 2018-10-05: qty 2
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
## 2018-10-12 ENCOUNTER — Other Ambulatory Visit: Payer: Self-pay

## 2018-10-12 ENCOUNTER — Encounter: Payer: Self-pay | Admitting: Family Medicine

## 2018-10-12 ENCOUNTER — Ambulatory Visit (INDEPENDENT_AMBULATORY_CARE_PROVIDER_SITE_OTHER): Payer: Self-pay | Admitting: Family Medicine

## 2018-10-12 DIAGNOSIS — I671 Cerebral aneurysm, nonruptured: Secondary | ICD-10-CM | POA: Insufficient documentation

## 2018-10-12 NOTE — Assessment & Plan Note (Addendum)
There may be a genetic component to this aneurysm she does mention that she has a family member who likely died due to a ruptured aneurysm.  She does follow with neurology already for the treatment of her migraines with aura.  She was encouraged to call her neurologist for a new appointment to discuss appropriate management of this newly discovered aneurysm.  She is told that she should be assessed immediately in the ED if she experienced sudden onset of the "worst headache of her life", new focal deficits, new vision changes or unexplained nausea.

## 2018-10-12 NOTE — Progress Notes (Signed)
    Subjective:  Vicki Mack is a 43 y.o. female who presents to the [facility] today for an ED visit follow-up.   HPI:  Inferior cerebellar artery aneurysm She was seen in the ED on 8/14 for chest pain.  In the course of her work-up CTA head was performed which showed a 2 mm aneurysm of the right ICA.  She is encouraged to follow-up with her PCP at that time for further work-up and monitoring of this aneurysm.  In clinic today, she reports that she is having no new focal deficits, no new headaches, no new acute vision changes, no current nausea (she reports nausea which comes and goes for the past several days at night though she has no current nausea).  She reports that, prior to her ED assessment on 8/14, she was never before aware that she had an aneurysm in her brain.  She notes that she has a family member (either the brother or father of her mother) who died around the age of 63 due to a ruptured aneurysm.  She would like to know what the next steps are to appropriately assess and monitor this issue.  Chief Complaint noted Review of Symptoms - see HPI PMH -history of migraines with aura, potential family history of aneurysms.   Objective:  Physical Exam: BP (!) 140/100   Pulse 91   Ht 5\' 7"  (1.702 m)   Wt 138 lb 6 oz (62.8 kg)   SpO2 99%   BMI 21.67 kg/m    Gen: Anxious appearing 43 year old female.  NAD, resting comfortably CV: RRR with no murmurs appreciated Pulm: NWOB, CTAB with no crackles, wheezes, or rhonchi GI: Normal bowel sounds present. Soft, Nontender, Nondistended. MSK: no edema, cyanosis, or clubbing noted Skin: warm, dry Neuro: grossly normal, moves all extremities.  Cranial nerves II through XII intact.  No results found for this or any previous visit (from the past 72 hour(s)).   Assessment/Plan:  Intracranial aneurysm There may be a genetic component to this aneurysm she does mention that she has a family member who likely died due to a ruptured  aneurysm.  She does follow with neurology already for the treatment of her migraines with aura.  She was encouraged to call her neurologist for a new appointment to discuss appropriate management of this newly discovered aneurysm.  She is told that she should be assessed immediately in the ED if she experienced sudden onset of the "worst headache of her life", new focal deficits, new vision changes or unexplained nausea.

## 2018-10-12 NOTE — Patient Instructions (Addendum)
For your newly found 2 mm aneurysm, the best doctor for you to talk to will be a neurologist.  That doctor will help you figure out if you need any intervention or if we can simply monitor you with head imaging every once in a while.    I recommend that you call you current neurologist and ask for an appointment to discuss your newly found aneurysm in your ICA (inferior cerebellar artery).   Please feel free to schedule another appointment to discuss you neck and leg pain if you would like a more thorough visit about that.

## 2019-01-10 ENCOUNTER — Emergency Department (HOSPITAL_COMMUNITY): Payer: Self-pay

## 2019-01-10 ENCOUNTER — Emergency Department (HOSPITAL_COMMUNITY)
Admission: EM | Admit: 2019-01-10 | Discharge: 2019-01-10 | Disposition: A | Discharge status: Home / Self Care | Payer: Self-pay | Attending: Emergency Medicine | Admitting: Emergency Medicine

## 2019-01-10 ENCOUNTER — Encounter (HOSPITAL_COMMUNITY): Payer: Self-pay | Admitting: Radiology

## 2019-01-10 ENCOUNTER — Other Ambulatory Visit: Payer: Self-pay

## 2019-01-10 DIAGNOSIS — R519 Headache, unspecified: Secondary | ICD-10-CM | POA: Insufficient documentation

## 2019-01-10 DIAGNOSIS — I69911 Memory deficit following unspecified cerebrovascular disease: Secondary | ICD-10-CM | POA: Insufficient documentation

## 2019-01-10 DIAGNOSIS — Z79899 Other long term (current) drug therapy: Secondary | ICD-10-CM | POA: Insufficient documentation

## 2019-01-10 DIAGNOSIS — R200 Anesthesia of skin: Secondary | ICD-10-CM | POA: Insufficient documentation

## 2019-01-10 LAB — COMPREHENSIVE METABOLIC PANEL
ALT: 11 U/L (ref 0–44)
AST: 15 U/L (ref 15–41)
Albumin: 4 g/dL (ref 3.5–5.0)
Alkaline Phosphatase: 72 U/L (ref 38–126)
Anion gap: 8 (ref 5–15)
BUN: 5 mg/dL — ABNORMAL LOW (ref 6–20)
CO2: 25 mmol/L (ref 22–32)
Calcium: 8.8 mg/dL — ABNORMAL LOW (ref 8.9–10.3)
Chloride: 104 mmol/L (ref 98–111)
Creatinine, Ser: 0.93 mg/dL (ref 0.44–1.00)
GFR calc Af Amer: >60 mL/min (ref >60)
GFR calc non Af Amer: >60 mL/min (ref >60)
Glucose, Bld: 98 mg/dL (ref 70–99)
Potassium: 4 mmol/L (ref 3.5–5.1)
Sodium: 137 mmol/L (ref 135–145)
Total Bilirubin: 0.7 mg/dL (ref 0.3–1.2)
Total Protein: 7.2 g/dL (ref 6.5–8.1)

## 2019-01-10 LAB — I-STAT CHEM 8, ED
BUN: 5 mg/dL — ABNORMAL LOW (ref 6–20)
Calcium, Ion: 1.12 mmol/L — ABNORMAL LOW (ref 1.15–1.40)
Chloride: 101 mmol/L (ref 98–111)
Creatinine, Ser: 0.8 mg/dL (ref 0.44–1.00)
Glucose, Bld: 93 mg/dL (ref 70–99)
HCT: 44 % (ref 36.0–46.0)
Hemoglobin: 15 g/dL (ref 12.0–15.0)
Potassium: 3.8 mmol/L (ref 3.5–5.1)
Sodium: 138 mmol/L (ref 135–145)
TCO2: 28 mmol/L (ref 22–32)

## 2019-01-10 LAB — DIFFERENTIAL
Abs Immature Granulocytes: 0.01 10*3/uL (ref 0.00–0.07)
Basophils Absolute: 0.1 10*3/uL (ref 0.0–0.1)
Basophils Relative: 1 %
Eosinophils Absolute: 0.3 10*3/uL (ref 0.0–0.5)
Eosinophils Relative: 3 %
Immature Granulocytes: 0 %
Lymphocytes Relative: 36 %
Lymphs Abs: 3 10*3/uL (ref 0.7–4.0)
Monocytes Absolute: 0.7 10*3/uL (ref 0.1–1.0)
Monocytes Relative: 8 %
Neutro Abs: 4.3 10*3/uL (ref 1.7–7.7)
Neutrophils Relative %: 52 %

## 2019-01-10 LAB — CBC
HCT: 43.6 % (ref 36.0–46.0)
Hemoglobin: 14.2 g/dL (ref 12.0–15.0)
MCH: 28.4 pg (ref 26.0–34.0)
MCHC: 32.6 g/dL (ref 30.0–36.0)
MCV: 87.2 fL (ref 80.0–100.0)
Platelets: 249 10*3/uL (ref 150–400)
RBC: 5 MIL/uL (ref 3.87–5.11)
RDW: 14.5 % (ref 11.5–15.5)
WBC: 8.3 10*3/uL (ref 4.0–10.5)
nRBC: 0 % (ref 0.0–0.2)

## 2019-01-10 LAB — PROTIME-INR
INR: 1 (ref 0.8–1.2)
Prothrombin Time: 13.5 seconds (ref 11.4–15.2)

## 2019-01-10 LAB — I-STAT BETA HCG BLOOD, ED (MC, WL, AP ONLY): I-stat hCG, quantitative: <5 m[IU]/mL (ref <5)

## 2019-01-10 LAB — CBG MONITORING, ED: Glucose-Capillary: 74 mg/dL (ref 70–99)

## 2019-01-10 LAB — APTT: aPTT: 29 seconds (ref 24–36)

## 2019-01-10 MED ORDER — PROCHLORPERAZINE MALEATE 5 MG PO TABS
10.0000 mg | ORAL_TABLET | Freq: Once | ORAL | Status: AC
  Administered 2019-01-10: 10 mg via ORAL
  Filled 2019-01-10: qty 2

## 2019-01-10 MED ORDER — SODIUM CHLORIDE 0.9% FLUSH
3.0000 mL | Freq: Once | INTRAVENOUS | Status: DC
Start: 2019-01-10 — End: 2019-01-11

## 2019-01-10 MED ORDER — ACETAMINOPHEN 500 MG PO TABS
1000.0000 mg | ORAL_TABLET | Freq: Once | ORAL | Status: AC
  Administered 2019-01-10: 1000 mg via ORAL
  Filled 2019-01-10: qty 2

## 2019-01-10 NOTE — ED Triage Notes (Signed)
Per pt she was dx about 3 months ago with an aneurysm and was told if any severe headaches occur to report to ED. Pt said her headache began today and pt is saying memory is different and noticed her speech is different. No blurred vision

## 2019-01-10 NOTE — ED Provider Notes (Signed)
[facility] Provider Note  CSN: [id number] Arrival date & time: 01/10/19 2045  Chief Complaint(s) Headache (aneurysm)  HPI Vicki Mack is a 43 y.o. female with a history of migraine headaches and known ICA aneurysm presents to the emergency department with sudden onset bitemporal and occipital headache similar to her prior migraines.  However the intensity was severe.  Patient also endorsed left leg numbness which is typical for her migraine headaches.  Her headache has now subsided without any intervention.  Patient was concern for aneurysm rupture as her primary care provider instructed her to come to the emergency department whenever she had a severe headache.  Patient denies any recent fevers or infections.  No nausea or vomiting.  She reports chronic vision problems but no acute deficits with this episode.  She denies any other physical complaints at this time.  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  She reports that her eyes are being strained due to having to read small print.  HPI  Past Medical History Past Medical History:  Diagnosis Date   Abnormal Pap smear    colpo with biopsy at age 29   Bipolar 56 disorder ([place])    Infertility    able to become pregnant with Clomid   Low iron    PTSD (post-traumatic stress disorder)    seeing neuropsychologist   Patient Active Problem List   Diagnosis Date Noted   Intracranial aneurysm 10/12/2018   Viral URI with cough 05/03/2018   Migraine headache without aura 08/27/2011   SAB (spontaneous abortion) 08/27/2011   Home Medication(s) Prior to Admission medications   Medication Sig Start Date End Date Taking? Authorizing Provider  acetaminophen (TYLENOL) 500 MG tablet Take 500 mg by mouth every 6 (six) hours as needed for headache (pain).    [provider]  clindamycin (CLEOCIN) 300 MG capsule Take 300 mg by mouth 4 (four) times daily. Until  gone 09/20/18   [provider]  diphenhydramine-acetaminophen (TYLENOL PM) 25-500 MG TABS tablet Take 1 tablet by mouth at bedtime as needed (sleep).    [provider]  ibuprofen (ADVIL,MOTRIN) 400 MG tablet Take 1 tablet (400 mg total) by mouth every 8 (eight) hours as needed for moderate pain. Patient not taking: Reported on 09/30/2018 07/04/17   Ripley Fraise, MD  Levonorgestrel (LILETTA, 52 MG, IU) 1 application by Intrauterine route once. Since June 2017    [provider]  Multiple Vitamins-Minerals (HAIR/SKIN/NAILS/BIOTIN) TABS Take 1 tablet by mouth daily.    [provider]                                                                                                                                    Past Surgical History Past Surgical History:  Procedure Laterality Date   NO PAST SURGERIES     Family History Family History  Problem Relation Age of Onset  Diabetes Mother    Hypertension Mother    Diabetes Father    Hypertension Father    Alzheimer's disease Father    Other Sister        breast cysts   Breast cancer Paternal Aunt        had mastectomy   Alzheimer's disease Paternal Aunt     Social History Social History   Tobacco Use   Smoking status: Never Smoker   Smokeless tobacco: Never Used  Substance Use Topics   Alcohol use: No   Drug use: No   Allergies Dilaudid [hydromorphone hcl], Phenergan [promethazine hcl], Codeine, Penicillins, and Sulfa antibiotics  Review of Systems Review of Systems All other systems are reviewed and are negative for acute change except as noted in the HPI  Physical Exam Vital Signs  I have reviewed the triage vital signs BP (!) 151/117 (BP Location: Right Arm)    Pulse 84    Temp 97.7 F (36.5 C) (Oral)    Resp 16    SpO2 100%   Physical Exam Vitals signs reviewed.  Constitutional:      General: She is not in acute distress.    Appearance: She is well-developed. She is  not diaphoretic.  HENT:     Head: Normocephalic and atraumatic.     Right Ear: External ear normal.     Left Ear: External ear normal.     Nose: Nose normal.  Eyes:     General: No scleral icterus.    Conjunctiva/sclera: Conjunctivae normal.  Neck:     Musculoskeletal: Normal range of motion.     Trachea: Phonation normal.  Cardiovascular:     Rate and Rhythm: Normal rate and regular rhythm.  Pulmonary:     Effort: Pulmonary effort is normal. No respiratory distress.     Breath sounds: No stridor.  Abdominal:     General: There is no distension.  Musculoskeletal: Normal range of motion.  Neurological:     Mental Status: She is alert and oriented to person, place, and time.     Comments: Mental Status:  Alert and oriented to person, place, and time.  Attention and concentration normal.  Speech clear.  Recent memory is intact  Cranial Nerves:  II Visual Fields: Intact to confrontation. Visual fields intact. III, IV, VI: Pupils equal and reactive to light and near. Full eye movement without nystagmus  V Facial Sensation: Normal. No weakness of masticatory muscles  VII: No facial weakness or asymmetry  VIII Auditory Acuity: Grossly normal  IX/X: The uvula is midline; the palate elevates symmetrically  XI: Normal sternocleidomastoid and trapezius strength  XII: The tongue is midline. No atrophy or fasciculations.   Motor System: Muscle Strength: 5/5 and symmetric in the upper and lower extremities. No pronation or drift.  Muscle Tone: Tone and muscle bulk are normal in the upper and lower extremities.   Reflexes: DTRs: 1+ and symmetrical in all four extremities. No Clonus Coordination: Intact finger-to-nose. No tremor.  Sensation: Intact to light touch.  Gait: Routine  gait normal.   Psychiatric:        Behavior: Behavior normal.     ED Results and Treatments Labs (all labs ordered are listed, but only abnormal results are displayed) Labs Reviewed  COMPREHENSIVE  METABOLIC PANEL - Abnormal; Notable for the following components:      Result Value   BUN 5 (*)    Calcium 8.8 (*)    All other components within normal limits  I-STAT CHEM  8, ED - Abnormal; Notable for the following components:   BUN 5 (*)    Calcium, Ion 1.12 (*)    All other components within normal limits  PROTIME-INR  APTT  CBC  DIFFERENTIAL  CBG MONITORING, ED  I-STAT BETA HCG BLOOD, ED ([facility], [facility], [facility] ONLY)                                                                                                                         EKG  EKG Interpretation  Date/Time:  Tuesday January 10 2019 22:38:08 EST Ventricular Rate:  75 PR Interval:    QRS Duration: 93 QT Interval:  407 QTC Calculation: 455 R Axis:   76 Text Interpretation: Sinus rhythm No significant change since last tracing Confirmed by Addison Lank [phone]) on 01/10/2019 11:40:43 PM      Radiology Ct Head Wo Contrast  Result Date: 01/10/2019 CLINICAL DATA:  Headache and decreased memory EXAM: CT HEAD WITHOUT CONTRAST TECHNIQUE: Contiguous axial images were obtained from the base of the skull through the vertex without intravenous contrast. COMPARISON:  September 30, 2018 FINDINGS: Brain: Ventricles are normal in size and configuration. There is no intracranial mass, hemorrhage, extra-axial fluid collection, or midline shift. Brain parenchyma appears unremarkable. No acute infarct evident. Vascular: There is no hyperdense vessel. No vascular calcifications are evident. Skull: The bony calvarium appears intact. Sinuses/Orbits: Visualized paranasal sinuses are clear. Visualized orbits appear symmetric bilaterally. Other: Mastoid air cells are clear. There is debris in each external auditory canal. IMPRESSION: Probable cerumen in each external auditory canal. Brain parenchyma appears unremarkable. No mass or hemorrhage. Study otherwise unremarkable. Electronically Signed   By: Lowella Grip III M.D.   On: 01/10/2019 21:31     Pertinent labs & imaging results that were available during my care of the patient were reviewed by me and considered in my medical decision making (see chart for details).  Medications Ordered in ED Medications  sodium chloride flush (NS) 0.9 % injection 3 mL (3 mLs Intravenous Not Given 01/10/19 2337)  acetaminophen (TYLENOL) tablet 1,000 mg (has no administration in time range)  prochlorperazine (COMPAZINE) tablet 10 mg (has no administration in time range)                                                                                                                                    Procedures Procedures  (including critical care time)  Medical Decision Making / ED Course I have reviewed the nursing notes for this encounter and the patient's prior records (if available in EHR or on provided paperwork).   Azusa Bodenheimer [place] was evaluated in Emergency Department on 01/10/2019 for the symptoms described in the history of present illness. She was evaluated in the context of the global COVID-19 pandemic, which necessitated consideration that the patient might be at risk for infection with the SARS-CoV-2 virus that causes COVID-19. Institutional protocols and algorithms that pertain to the evaluation of patients at risk for COVID-19 are in a state of rapid change based on information released by regulatory bodies including the CDC and federal and state organizations. These policies and algorithms were followed during the patient's care in the ED.  Typical headache for the pt. Non focal neuro exam. No recent head trauma. No fever. Doubt meningitis.   Patient had triage work-up with negative CT head ruling out subarachnoid hemorrhage.  We will treat patient with oral migraine cocktail.   The patient appears reasonably screened and/or stabilized for discharge and I doubt any other medical condition or other [facility] requiring further screening, evaluation, or treatment in the ED at this  time prior to discharge.  The patient is safe for discharge with strict return precautions.     Final Clinical Impression(s) / ED Diagnoses Final diagnoses:  Bad headache     The patient appears reasonably screened and/or stabilized for discharge and I doubt any other medical condition or other [facility] requiring further screening, evaluation, or treatment in the ED at this time prior to discharge.  Disposition: Discharge  Condition: Good  I have discussed the results, Dx and Tx plan with the patient who expressed understanding and agree(s) with the plan. Discharge instructions discussed at great length. The patient was given strict return precautions who verbalized understanding of the instructions. No further questions at time of discharge.    ED Discharge Orders    None      Follow Up: Primary care provider  Schedule an appointment as soon as possible for a visit  As needed     This chart was dictated using voice recognition software.  Despite best efforts to proofread,  errors can occur which can change the documentation meaning.   Fatima Blank, MD 01/10/19 2342

## 2019-02-17 HISTORY — PX: CHOLECYSTECTOMY: SHX55

## 2019-05-27 ENCOUNTER — Emergency Department (HOSPITAL_COMMUNITY): Payer: BLUE CROSS/BLUE SHIELD

## 2019-05-27 ENCOUNTER — Encounter (HOSPITAL_COMMUNITY): Payer: Self-pay | Admitting: Emergency Medicine

## 2019-05-27 ENCOUNTER — Emergency Department (HOSPITAL_COMMUNITY)
Admission: EM | Admit: 2019-05-27 | Discharge: 2019-05-27 | Disposition: A | Discharge status: Home / Self Care | Payer: BLUE CROSS/BLUE SHIELD | Attending: Emergency Medicine | Admitting: Emergency Medicine

## 2019-05-27 ENCOUNTER — Other Ambulatory Visit: Payer: Self-pay

## 2019-05-27 DIAGNOSIS — F319 Bipolar disorder, unspecified: Secondary | ICD-10-CM | POA: Insufficient documentation

## 2019-05-27 DIAGNOSIS — R200 Anesthesia of skin: Secondary | ICD-10-CM | POA: Insufficient documentation

## 2019-05-27 DIAGNOSIS — R202 Paresthesia of skin: Secondary | ICD-10-CM | POA: Insufficient documentation

## 2019-05-27 DIAGNOSIS — M25512 Pain in left shoulder: Secondary | ICD-10-CM | POA: Insufficient documentation

## 2019-05-27 DIAGNOSIS — R519 Headache, unspecified: Secondary | ICD-10-CM | POA: Diagnosis not present

## 2019-05-27 DIAGNOSIS — M542 Cervicalgia: Secondary | ICD-10-CM | POA: Diagnosis not present

## 2019-05-27 DIAGNOSIS — Z79899 Other long term (current) drug therapy: Secondary | ICD-10-CM | POA: Diagnosis not present

## 2019-05-27 HISTORY — DX: Aneurysm of unspecified site: I72.9

## 2019-05-27 LAB — CBC WITH DIFFERENTIAL/PLATELET
Abs Immature Granulocytes: 0.03 10*3/uL (ref 0.00–0.07)
Basophils Absolute: 0.1 10*3/uL (ref 0.0–0.1)
Basophils Relative: 1 %
Eosinophils Absolute: 0.3 10*3/uL (ref 0.0–0.5)
Eosinophils Relative: 3 %
HCT: 43.2 % (ref 36.0–46.0)
Hemoglobin: 13.9 g/dL (ref 12.0–15.0)
Immature Granulocytes: 0 %
Lymphocytes Relative: 36 %
Lymphs Abs: 3.6 10*3/uL (ref 0.7–4.0)
MCH: 28.4 pg (ref 26.0–34.0)
MCHC: 32.2 g/dL (ref 30.0–36.0)
MCV: 88.2 fL (ref 80.0–100.0)
Monocytes Absolute: 0.8 10*3/uL (ref 0.1–1.0)
Monocytes Relative: 8 %
Neutro Abs: 5.2 10*3/uL (ref 1.7–7.7)
Neutrophils Relative %: 52 %
Platelets: 255 10*3/uL (ref 150–400)
RBC: 4.9 MIL/uL (ref 3.87–5.11)
RDW: 14.6 % (ref 11.5–15.5)
WBC: 9.9 10*3/uL (ref 4.0–10.5)
nRBC: 0 % (ref 0.0–0.2)

## 2019-05-27 LAB — BASIC METABOLIC PANEL
Anion gap: 8 (ref 5–15)
BUN: 7 mg/dL (ref 6–20)
CO2: 24 mmol/L (ref 22–32)
Calcium: 8.8 mg/dL — ABNORMAL LOW (ref 8.9–10.3)
Chloride: 106 mmol/L (ref 98–111)
Creatinine, Ser: 1.04 mg/dL — ABNORMAL HIGH (ref 0.44–1.00)
GFR calc Af Amer: >60 mL/min (ref >60)
GFR calc non Af Amer: >60 mL/min (ref >60)
Glucose, Bld: 94 mg/dL (ref 70–99)
Potassium: 4.1 mmol/L (ref 3.5–5.1)
Sodium: 138 mmol/L (ref 135–145)

## 2019-05-27 LAB — I-STAT BETA HCG BLOOD, ED (MC, WL, AP ONLY): I-stat hCG, quantitative: <5 m[IU]/mL (ref <5)

## 2019-05-27 LAB — PROTIME-INR
INR: 1 (ref 0.8–1.2)
Prothrombin Time: 13.2 seconds (ref 11.4–15.2)

## 2019-05-27 MED ORDER — KETOROLAC TROMETHAMINE 15 MG/ML IJ SOLN
15.0000 mg | Freq: Once | INTRAMUSCULAR | Status: AC
  Administered 2019-05-27: 15 mg via INTRAVENOUS
  Filled 2019-05-27: qty 1

## 2019-05-27 MED ORDER — ONDANSETRON HCL 4 MG PO TABS: 4.0000 mg | ORAL_TABLET | Freq: Four times a day (QID) | ORAL | 0 refills | Status: DC

## 2019-05-27 MED ORDER — ONDANSETRON HCL 4 MG/2ML IJ SOLN
4.0000 mg | Freq: Once | INTRAMUSCULAR | Status: AC
  Administered 2019-05-27: 4 mg via INTRAVENOUS
  Filled 2019-05-27: qty 2

## 2019-05-27 MED ORDER — SODIUM CHLORIDE 0.9 % IV BOLUS (SEPSIS)
1000.0000 mL | Freq: Once | INTRAVENOUS | Status: AC
  Administered 2019-05-27: 20:00:00 1000 mL via INTRAVENOUS

## 2019-05-27 NOTE — ED Provider Notes (Signed)
[facility] [facility] EMERGENCY DEPARTMENT Provider Note   CSN: [id number] Arrival date & time: 05/27/19  1755     History Chief Complaint  Patient presents with  . Headache  . Emesis    Vicki Mack is a 44 y.o. female.  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 Patient reports she was laying down and relaxing when it started suddenly. It is both frontal and occipital and similar to headaches she has had in the past.  She reports that she has associated a left-sided numbness and tingling of her face, upper and lower extremities.  Reports that this is not new and she often gets this and it comes and goes. She reports she was concerned about her incident aneurism so came to the ER to be evaluated for this.  Also reports pain aching in her left shoulder and neck.  Reports that she has seen a chiropractor in the past and was also told that she has a bulging disc.  She did recently see her neuro surgeon who recommended repeat CTA in 2 years from January 2021        Past Medical History:  Diagnosis Date  . Abnormal Pap smear    colpo with biopsy at age 55  . Aneurysm ([place])   . Bipolar 1 disorder ([place])   . Infertility    able to become pregnant with Clomid  . Low iron   . PTSD (post-traumatic stress disorder)    seeing neuropsychologist    Patient Active Problem List   Diagnosis Date Noted  . Intracranial aneurysm 10/12/2018  . Viral URI with cough 05/03/2018  . Migraine headache without aura 08/27/2011  . SAB (spontaneous abortion) 08/27/2011    Past Surgical History:  Procedure Laterality Date  . NO PAST SURGERIES       OB History    Gravida  3   Para  1   Term  1   Preterm      AB  2   Living  1     SAB  2   TAB      Ectopic      Multiple      Live Births  1           Family History  Problem Relation Age of  Onset  . Diabetes Mother   . Hypertension Mother   . Diabetes Father   . Hypertension Father   . Alzheimer's disease Father   . Other Sister        breast cysts  . Breast cancer Paternal Aunt        had mastectomy  . Alzheimer's disease Paternal Aunt     Social History   Tobacco Use  . Smoking status: Never Smoker  . Smokeless tobacco: Never Used  Substance Use Topics  . Alcohol use: No  . Drug use: No    Home Medications Prior to Admission medications   Medication Sig Start Date End Date Taking? Authorizing Provider  acetaminophen (TYLENOL) 500 MG tablet Take 500 mg by mouth every 6 (six) hours as needed for headache (pain).    [provider]  clindamycin (CLEOCIN) 300 MG capsule Take 300 mg by mouth 4 (four) times daily. Until gone 09/20/18   [provider]  diphenhydramine-acetaminophen (TYLENOL PM) 25-500 MG TABS tablet Take 1 tablet by mouth at bedtime as needed (sleep).  [provider]  ibuprofen (ADVIL,MOTRIN) 400 MG tablet Take 1 tablet (400 mg total) by mouth every 8 (eight) hours as needed for moderate pain. Patient not taking: Reported on 09/30/2018 07/04/17   Ripley Fraise, MD  Levonorgestrel (LILETTA, 52 MG, IU) 1 application by Intrauterine route once. Since June 2017    [provider]  Multiple Vitamins-Minerals (HAIR/SKIN/NAILS/BIOTIN) TABS Take 1 tablet by mouth daily.    [provider]  ondansetron (ZOFRAN) 4 MG tablet Take 1 tablet (4 mg total) by mouth every 6 (six) hours. 05/27/19   Alveria Apley, PA-C    Allergies    Dilaudid [hydromorphone hcl], Phenergan [promethazine hcl], Codeine, Penicillins, and Sulfa antibiotics  Review of Systems   Review of Systems  Constitutional: Negative.  Negative for chills and fever.  HENT: Negative for ear pain and sore throat.   Eyes: Negative for photophobia, pain, redness and visual disturbance.  Respiratory: Negative for cough and shortness of breath.     Cardiovascular: Negative for chest pain and palpitations.  Gastrointestinal: Negative for abdominal pain and vomiting.  Genitourinary: Negative for dysuria and hematuria.  Musculoskeletal: Positive for arthralgias. Negative for back pain.  Skin: Negative for color change and rash.  Neurological: Positive for numbness and headaches. Negative for dizziness, tremors, seizures, syncope, facial asymmetry, speech difficulty, weakness and light-headedness.  All other systems reviewed and are negative.   Physical Exam Updated Vital Signs BP (!) 134/96 Comment: room air  Pulse 64 Comment: room air  Temp 98.9 F (37.2 C) (Oral)   Resp 20 Comment: room air  SpO2 100% Comment: room air  Physical Exam Vitals and nursing note reviewed.  Constitutional:      General: She is not in acute distress.    Appearance: Normal appearance. She is well-developed. She is not ill-appearing, toxic-appearing or diaphoretic.  HENT:     Head: Normocephalic.     Mouth/Throat:     Mouth: Mucous membranes are moist.  Eyes:     General: No visual field deficit or scleral icterus.    Extraocular Movements: Extraocular movements intact.     Right eye: Normal extraocular motion and no nystagmus.     Left eye: Normal extraocular motion and no nystagmus.     Conjunctiva/sclera: Conjunctivae normal.     Pupils: Pupils are equal, round, and reactive to light. Pupils are equal.     Right eye: Pupil is round and reactive.     Left eye: Pupil is round and reactive.  Cardiovascular:     Rate and Rhythm: Normal rate and regular rhythm.     Heart sounds: Normal heart sounds.  Pulmonary:     Effort: Pulmonary effort is normal.     Breath sounds: Normal breath sounds.  Musculoskeletal:     Cervical back: Normal range of motion.  Skin:    General: Skin is warm and dry.  Neurological:     Mental Status: She is alert.     Cranial Nerves: No cranial nerve deficit, dysarthria or facial asymmetry.     Sensory: Sensory  deficit (subjective decreased sensation in UE and LE on the left) present.  Psychiatric:        Mood and Affect: Mood normal. Mood is not anxious.     ED Results / Procedures / Treatments   Labs (all labs ordered are listed, but only abnormal results are displayed) Labs Reviewed  BASIC METABOLIC PANEL - Abnormal; Notable for the following components:      Result Value  Creatinine, Ser 1.04 (*)    Calcium 8.8 (*)    All other components within normal limits  CBC WITH DIFFERENTIAL/PLATELET  PROTIME-INR  I-STAT BETA HCG BLOOD, ED ([facility], [facility], [facility] ONLY)    EKG None  Radiology CT Head Wo Contrast  Result Date: 05/27/2019 CLINICAL DATA:  Headache EXAM: CT HEAD WITHOUT CONTRAST TECHNIQUE: Contiguous axial images were obtained from the base of the skull through the vertex without intravenous contrast. COMPARISON:  CT brain 01/10/2019 FINDINGS: Brain: No acute territorial infarction, hemorrhage, or intracranial mass. The ventricles are nonenlarged. Vascular: No hyperdense vessels.  No unexpected calcification Skull: Normal. Negative for fracture or focal lesion. Sinuses/Orbits: No acute finding. Other: None IMPRESSION: Negative non contrasted CT appearance of the brain Electronically Signed   By: Donavan Foil M.D.   On: 05/27/2019 19:32    Procedures Procedures (including critical care time)  Medications Ordered in ED Medications  sodium chloride 0.9 % bolus 1,000 mL (1,000 mLs Intravenous New Bag/Given 05/27/19 2018)  ketorolac (TORADOL) 15 MG/ML injection 15 mg (15 mg Intravenous Given 05/27/19 2018)  ondansetron (ZOFRAN) injection 4 mg (4 mg Intravenous Given 05/27/19 2018)    ED Course  I have reviewed the triage vital signs and the nursing notes.  Pertinent labs & imaging results that were available during my care of the patient were reviewed by me and considered in my medical decision making (see chart for details).  Clinical Course as of May 26 2049  Sat May 27, 2019  1948  Patient presenting with headache similar to previous headache syndromes. Workup reassuring. I reviewed neurosurgery notes who did not believe her headaches were from her aneurism. Patient had CT scan completed within 5 hours of symptoms onset and reassuring for no bleed. She has no neurological deficits on exam. Improved with migraine coctail. Discussed with Dr. Zenia Resides and plan to d/c with PMD f/u agreed upon   [KM]    Clinical Course User Index [KM] Alveria Apley, PA-C   MDM Rules/Calculators/A&P                      Based on review of vitals, medical screening exam, lab work and/or imaging, there does not appear to be an acute, emergent etiology for the patient's symptoms. Counseled pt on good return precautions and encouraged both PCP and ED follow-up as needed.  Prior to discharge, I also discussed incidental imaging findings with patient in detail and advised appropriate, recommended follow-up in detail.  Clinical Impression: 1. Nonintractable episodic headache, unspecified headache type     Disposition: Discharge  Prior to providing a prescription for a controlled substance, I independently reviewed the patient's recent prescription history on the [place]. The patient had no recent or regular prescriptions and was deemed appropriate for a brief, less than 3 day prescription of narcotic for acute analgesia.  This note was prepared with assistance of Systems analyst. Occasional wrong-word or sound-a-like substitutions may have occurred due to the inherent limitations of voice recognition software.  Final Clinical Impression(s) / ED Diagnoses Final diagnoses:  Nonintractable episodic headache, unspecified headache type    Rx / DC Orders ED Discharge Orders         Ordered    ondansetron (ZOFRAN) 4 MG tablet  Every 6 hours     05/27/19 2050           Kristine Royal 05/27/19 2051    Lacretia Leigh,  MD 05/28/19  1711  

## 2019-05-27 NOTE — ED Triage Notes (Signed)
C/o headache and vomiting that started within the last hour.  Reports history of brain aneurysm.  No weakness noted on exam.

## 2019-05-27 NOTE — Discharge Instructions (Addendum)
Thank you for allowing me to care for you today. Please return to the emergency department if you have new or worsening symptoms. Take your medications as instructed.  ° °

## 2019-11-06 ENCOUNTER — Other Ambulatory Visit: Payer: Self-pay

## 2019-11-06 ENCOUNTER — Emergency Department (HOSPITAL_COMMUNITY)
Admission: EM | Admit: 2019-11-06 | Discharge: 2019-11-07 | Disposition: A | Discharge status: Home / Self Care | Payer: BLUE CROSS/BLUE SHIELD | Attending: Emergency Medicine | Admitting: Emergency Medicine

## 2019-11-06 ENCOUNTER — Encounter (HOSPITAL_COMMUNITY): Payer: Self-pay

## 2019-11-06 DIAGNOSIS — Z5321 Procedure and treatment not carried out due to patient leaving prior to being seen by health care provider: Secondary | ICD-10-CM | POA: Diagnosis not present

## 2019-11-06 DIAGNOSIS — R1011 Right upper quadrant pain: Secondary | ICD-10-CM | POA: Insufficient documentation

## 2019-11-06 DIAGNOSIS — K8020 Calculus of gallbladder without cholecystitis without obstruction: Secondary | ICD-10-CM | POA: Insufficient documentation

## 2019-11-06 LAB — COMPREHENSIVE METABOLIC PANEL WITH GFR
ALT: 14 U/L (ref 0–44)
AST: 24 U/L (ref 15–41)
Albumin: 3.6 g/dL (ref 3.5–5.0)
Alkaline Phosphatase: 58 U/L (ref 38–126)
Anion gap: 8 (ref 5–15)
BUN: 8 mg/dL (ref 6–20)
CO2: 25 mmol/L (ref 22–32)
Calcium: 8.8 mg/dL — ABNORMAL LOW (ref 8.9–10.3)
Chloride: 105 mmol/L (ref 98–111)
Creatinine, Ser: 1.07 mg/dL — ABNORMAL HIGH (ref 0.44–1.00)
GFR calc Af Amer: >60 mL/min (ref >60)
GFR calc non Af Amer: >60 mL/min (ref >60)
Glucose, Bld: 115 mg/dL — ABNORMAL HIGH (ref 70–99)
Potassium: 4.1 mmol/L (ref 3.5–5.1)
Sodium: 138 mmol/L (ref 135–145)
Total Bilirubin: 0.5 mg/dL (ref 0.3–1.2)
Total Protein: 6.5 g/dL (ref 6.5–8.1)

## 2019-11-06 LAB — CBC
HCT: 41.6 % (ref 36.0–46.0)
Hemoglobin: 13.2 g/dL (ref 12.0–15.0)
MCH: 28.8 pg (ref 26.0–34.0)
MCHC: 31.7 g/dL (ref 30.0–36.0)
MCV: 90.6 fL (ref 80.0–100.0)
Platelets: 244 10*3/uL (ref 150–400)
RBC: 4.59 MIL/uL (ref 3.87–5.11)
RDW: 13.8 % (ref 11.5–15.5)
WBC: 6.8 10*3/uL (ref 4.0–10.5)
nRBC: 0 % (ref 0.0–0.2)

## 2019-11-06 LAB — LIPASE, BLOOD: Lipase: 27 U/L (ref 11–51)

## 2019-11-06 NOTE — ED Triage Notes (Signed)
Pt diagnosed with gallstones last week via abdominal ultrasound, spitting up/throwing up since. No diahhrea, throwing up blood or coffee ground emesis. RUQ pain consistent with gallstones and epigastric pain.

## 2019-11-06 NOTE — ED Notes (Signed)
Pt states she is leaving  

## 2020-09-20 ENCOUNTER — Emergency Department (HOSPITAL_COMMUNITY)
Admission: EM | Admit: 2020-09-20 | Discharge: 2020-09-21 | Disposition: A | Discharge status: Home / Self Care | Payer: BLUE CROSS/BLUE SHIELD | Attending: Emergency Medicine | Admitting: Emergency Medicine

## 2020-09-20 ENCOUNTER — Emergency Department (HOSPITAL_COMMUNITY): Payer: BLUE CROSS/BLUE SHIELD

## 2020-09-20 ENCOUNTER — Other Ambulatory Visit: Payer: Self-pay

## 2020-09-20 ENCOUNTER — Encounter (HOSPITAL_COMMUNITY): Payer: Self-pay | Admitting: Pharmacy Technician

## 2020-09-20 DIAGNOSIS — I10 Essential (primary) hypertension: Secondary | ICD-10-CM | POA: Insufficient documentation

## 2020-09-20 DIAGNOSIS — T671XXA Heat syncope, initial encounter: Secondary | ICD-10-CM | POA: Diagnosis not present

## 2020-09-20 DIAGNOSIS — T6701XA Heatstroke and sunstroke, initial encounter: Secondary | ICD-10-CM

## 2020-09-20 DIAGNOSIS — R0789 Other chest pain: Secondary | ICD-10-CM | POA: Diagnosis not present

## 2020-09-20 DIAGNOSIS — N898 Other specified noninflammatory disorders of vagina: Secondary | ICD-10-CM | POA: Insufficient documentation

## 2020-09-20 DIAGNOSIS — R55 Syncope and collapse: Secondary | ICD-10-CM | POA: Diagnosis present

## 2020-09-20 LAB — TROPONIN I (HIGH SENSITIVITY)
Troponin I (High Sensitivity): <2 ng/L (ref <18)
Troponin I (High Sensitivity): 4 ng/L (ref <18)

## 2020-09-20 LAB — COMPREHENSIVE METABOLIC PANEL
ALT: 15 U/L (ref 0–44)
AST: 21 U/L (ref 15–41)
Albumin: 4.1 g/dL (ref 3.5–5.0)
Alkaline Phosphatase: 65 U/L (ref 38–126)
Anion gap: 6 (ref 5–15)
BUN: 9 mg/dL (ref 6–20)
CO2: 26 mmol/L (ref 22–32)
Calcium: 9.2 mg/dL (ref 8.9–10.3)
Chloride: 103 mmol/L (ref 98–111)
Creatinine, Ser: 1.06 mg/dL — ABNORMAL HIGH (ref 0.44–1.00)
GFR, Estimated: >60 mL/min (ref >60)
Glucose, Bld: 93 mg/dL (ref 70–99)
Potassium: 3.7 mmol/L (ref 3.5–5.1)
Sodium: 135 mmol/L (ref 135–145)
Total Bilirubin: 0.8 mg/dL (ref 0.3–1.2)
Total Protein: 7.2 g/dL (ref 6.5–8.1)

## 2020-09-20 LAB — CBC WITH DIFFERENTIAL/PLATELET
Abs Immature Granulocytes: 0.03 10*3/uL (ref 0.00–0.07)
Basophils Absolute: 0.1 10*3/uL (ref 0.0–0.1)
Basophils Relative: 1 %
Eosinophils Absolute: 0.3 10*3/uL (ref 0.0–0.5)
Eosinophils Relative: 3 %
HCT: 44.8 % (ref 36.0–46.0)
Hemoglobin: 14.7 g/dL (ref 12.0–15.0)
Immature Granulocytes: 0 %
Lymphocytes Relative: 28 %
Lymphs Abs: 2.3 10*3/uL (ref 0.7–4.0)
MCH: 29.3 pg (ref 26.0–34.0)
MCHC: 32.8 g/dL (ref 30.0–36.0)
MCV: 89.4 fL (ref 80.0–100.0)
Monocytes Absolute: 0.7 10*3/uL (ref 0.1–1.0)
Monocytes Relative: 9 %
Neutro Abs: 4.9 10*3/uL (ref 1.7–7.7)
Neutrophils Relative %: 59 %
Platelets: 273 10*3/uL (ref 150–400)
RBC: 5.01 MIL/uL (ref 3.87–5.11)
RDW: 13.3 % (ref 11.5–15.5)
WBC: 8.3 10*3/uL (ref 4.0–10.5)
nRBC: 0 % (ref 0.0–0.2)

## 2020-09-20 LAB — I-STAT BETA HCG BLOOD, ED (MC, WL, AP ONLY): I-stat hCG, quantitative: <5 m[IU]/mL (ref <5)

## 2020-09-20 MED ORDER — IOHEXOL 350 MG/ML SOLN
75.0000 mL | Freq: Once | INTRAVENOUS | Status: AC | PRN
  Administered 2020-09-20: 75 mL via INTRAVENOUS

## 2020-09-20 NOTE — Discharge Instructions (Addendum)
Please drink plenty of fluids Please do not work outside in the heat.  You are taking medications with prevent your body from responding well to heat. Please recheck with your doctor next week. Return to the emergency department if you are having worsening symptoms Your aneurysm is unchanged.

## 2020-09-20 NOTE — ED Provider Notes (Signed)
Emergency Medicine Provider Triage Evaluation Note  Vicki Mack , a 45 y.o. female  was evaluated in triage.  Pt complains of headache, lightheadedness, palpitations, chest pain.  Patient symptoms started at 1730.  Patient states symptoms started after she was working outside for 5 hours today.  Chest pain is midsternal and does not radiate.  Patient describes pain as being "punched in the chest."  No associated shortness of breath, nausea, vomiting, or diaphoresis.    Patient states that headache had sudden onset.  Patient states that pain feels like needles stabbing through her mid front of her head to the back of her head.  Patient has history of migraines but states that this headache is different from previous.  Patient reports history of brain aneurysm.  Patient denies any associated facial asymmetry, visual disturbance, slurred speech, weakness.  Patient endorses numbness to left lower extremity.  Patient states that this is baseline for her.    Review of Systems  Positive: Chest pain, palpitations, headache Negative: acial asymmetry, visual disturbance, slurred speech, weakness, shortness of breath, nausea, vomiting, or diaphoresis  Physical Exam  BP 136/90   Pulse 80   Temp 98.6 F (37 C) (Oral)   Resp 14   SpO2 100%  Gen:   Awake, no distress   Resp:  Normal effort, lungs clear to auscultation bilaterally MSK:   Moves extremities without difficulty,  Other:  +5 strength to bilateral lower extremities, pronator drift negative, sensation to light touch intact to bilateral upper and lower extremities.  No facial asymmetry or slurred speech.  Abdomen soft, nondistended,  Medical Decision Making  Medically screening exam initiated at 6:27 PM.  Appropriate orders placed.  Emary E Houston-Slade was informed that the remainder of the evaluation will be completed by another provider, this initial triage assessment does not replace that evaluation, and the importance of remaining  in the ED until their evaluation is complete.  The patient appears stable so that the remainder of the work up may be completed by another provider.      Loni Beckwith, PA-C 09/20/20 1830    Pattricia Boss, MD 09/20/20 2157

## 2020-09-20 NOTE — ED Provider Notes (Signed)
  Provider Note MRN:  [id number]  Arrival date & time: 09/21/20    ED Course and Medical Decision Making  Assumed care from Dr. Jeanell Sparrow at shift change.  Near syncope, out in the heat today.  Reassuring workup thus far with trop negative x 2, awaiting CTA head given reported h/o aneurysm.  Anticipating dc.  Procedures  Final Clinical Impressions(s) / ED Diagnoses     ICD-10-CM   1. Near syncope  R55     2. Heat stroke, initial encounter  T67.[place]       ED Discharge Orders     None         Discharge Instructions      Please drink plenty of fluids Please do not work outside in the heat.  You are taking medications with prevent your body from responding well to heat. Please recheck with your doctor next week. Return to the emergency department if you are having worsening symptoms Your aneurysm is unchanged.    Barth Kirks. [place]onia Small, [place] [email or web address]    Maudie Flakes, MD 09/21/20 [phone]

## 2020-09-20 NOTE — ED Triage Notes (Signed)
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  140/100 HR 92 20RR  98%  CBG 175

## 2020-09-20 NOTE — ED Provider Notes (Signed)
[facility] EMERGENCY DEPARTMENT Provider Note   CSN: [id number] Arrival date & time: 09/20/20  1740     History Chief Complaint  Patient presents with   Near Syncope    Vicki Mack is a 45 y.o. female.  HPI 45 year old female history of hypertension, aneurysm, followed at [facility], presents today after feeling very lightheaded while she was out working in the yard.  She felt like she got overheated and not had very much drink.  She noted that she had some chest tightness at that time.  She has not had dyspnea.  She has a headache that feels like pins or pricking various places in her scalp.  She reports that she had a small aneurysm and was told that she needed to be repeat imaging in 2025.  States she has been followed for this at [facility].  No records regarding this able to be visualized throughout the    Past Medical History:  Diagnosis Date   Abnormal Pap smear    colpo with biopsy at age 22   Aneurysm ([place])    Bipolar 1 disorder ([place])    Infertility    able to become pregnant with Clomid   Low iron    PTSD (post-traumatic stress disorder)    seeing neuropsychologist    Patient Active Problem List   Diagnosis Date Noted   Intracranial aneurysm 10/12/2018   Viral URI with cough 05/03/2018   Migraine headache without aura 08/27/2011   SAB (spontaneous abortion) 08/27/2011    Past Surgical History:  Procedure Laterality Date   NO PAST SURGERIES       OB History     Gravida  3   Para  1   Term  1   Preterm      AB  2   Living  1      SAB  2   IAB      Ectopic      Multiple      Live Births  1           Family History  Problem Relation Age of Onset   Diabetes Mother    Hypertension Mother    Diabetes Father    Hypertension Father    Alzheimer's disease Father    Other Sister        breast cysts   Breast cancer Paternal Aunt        had mastectomy   Alzheimer's disease Paternal Aunt     Social  History   Tobacco Use   Smoking status: Never   Smokeless tobacco: Never  Vaping Use   Vaping Use: Never used  Substance Use Topics   Alcohol use: No   Drug use: No    Home Medications Prior to Admission medications   Medication Sig Start Date End Date Taking? Authorizing Provider  acetaminophen (TYLENOL) 500 MG tablet Take 500 mg by mouth every 6 (six) hours as needed for headache (pain).    [provider]  clindamycin (CLEOCIN) 300 MG capsule Take 300 mg by mouth 4 (four) times daily. Until gone 09/20/18   [provider]  diphenhydramine-acetaminophen (TYLENOL PM) 25-500 MG TABS tablet Take 1 tablet by mouth at bedtime as needed (sleep).    [provider]  ibuprofen (ADVIL,MOTRIN) 400 MG tablet Take 1 tablet (400 mg total) by mouth every 8 (eight) hours as needed for moderate pain. Patient not taking: Reported on 09/30/2018 07/04/17   Ripley Fraise, MD  Levonorgestrel (LILETTA, 52 MG, IU) 1 application by Intrauterine route once. Since June 2017    [provider]  Multiple Vitamins-Minerals (HAIR/SKIN/NAILS/BIOTIN) TABS Take 1 tablet by mouth daily.    [provider]  ondansetron (ZOFRAN) 4 MG tablet Take 1 tablet (4 mg total) by mouth every 6 (six) hours. 05/27/19   Alveria Apley, PA-C    Allergies    Dilaudid [hydromorphone hcl], Phenergan [promethazine hcl], Codeine, Penicillins, and Sulfa antibiotics  Review of Systems   Review of Systems  All other systems reviewed and are negative.  Physical Exam Updated Vital Signs BP (!) 129/98 (BP Location: Right Arm)   Pulse 66   Temp 98 F (36.7 C) (Oral)   Resp 18   SpO2 100%   Physical Exam Vitals and nursing note reviewed.  Constitutional:      General: She is not in acute distress.    Appearance: Normal appearance. She is normal weight.  HENT:     Head: Normocephalic.     Right Ear: External ear normal.     Left Ear: External ear normal.     Nose: Nose normal.      Mouth/Throat:     Mouth: Mucous membranes are moist.     Pharynx: Oropharynx is clear.  Eyes:     Extraocular Movements: Extraocular movements intact.     Conjunctiva/sclera: Conjunctivae normal.     Pupils: Pupils are equal, round, and reactive to light.  Cardiovascular:     Rate and Rhythm: Normal rate and regular rhythm.     Pulses: Normal pulses.  Pulmonary:     Effort: Pulmonary effort is normal.     Breath sounds: Normal breath sounds.  Abdominal:     General: Abdomen is flat. Bowel sounds are normal.     Palpations: Abdomen is soft.  Musculoskeletal:        General: Normal range of motion.     Cervical back: Normal range of motion.  Skin:    General: Skin is warm and dry.     Capillary Refill: Capillary refill takes less than 2 seconds.  Neurological:     General: No focal deficit present.     Mental Status: She is alert.     Cranial Nerves: No cranial nerve deficit.     Sensory: No sensory deficit.     Motor: No weakness.     Coordination: Coordination normal.     Gait: Gait normal.     Deep Tendon Reflexes: Reflexes normal.  Psychiatric:        Mood and Affect: Mood normal.        Behavior: Behavior normal.    ED Results / Procedures / Treatments   Labs (all labs ordered are listed, but only abnormal results are displayed) Labs Reviewed  COMPREHENSIVE METABOLIC PANEL - Abnormal; Notable for the following components:      Result Value   Creatinine, Ser 1.06 (*)    All other components within normal limits  CBC WITH DIFFERENTIAL/PLATELET  I-STAT BETA HCG BLOOD, ED ([facility], [facility], [facility] ONLY)  TROPONIN I (HIGH SENSITIVITY)  TROPONIN I (HIGH SENSITIVITY)    EKG EKG Interpretation  Date/Time:  Friday September 20 2020 18:09:05 EDT Ventricular Rate:  71 PR Interval:  146 QRS Duration: 84 QT Interval:  394 QTC Calculation: 428 R Axis:   43 Text Interpretation: Normal sinus rhythm Normal ECG Confirmed by Pattricia Boss [phone]) on 09/20/2020 9:51:54 PM  Radiology DG  Chest 2 View  Result Date: 09/20/2020  CLINICAL DATA:  Chest pain and palpitations. EXAM: CHEST - 2 VIEW COMPARISON:  10/16/2015 FINDINGS: The cardiac silhouette, mediastinal and hilar contours are within normal limits. The lungs are clear. No pleural effusions. The bony thorax is intact. IMPRESSION: No acute cardiopulmonary findings. Electronically Signed   By: Marijo Sanes M.D.   On: 09/20/2020 18:49    Procedures Procedures   Medications Ordered in ED Medications - No data to display  ED Course  I have reviewed the triage vital signs and the nursing notes.  Pertinent labs & imaging results that were available during my care of the patient were reviewed by me and considered in my medical decision making (see chart for details).    MDM Rules/Calculators/A&P                           Patient with lightheadedness today while working out in the yard and extreme heat.  She feels that she had some volume depletion.  During this episode she experienced some mild chest tightness.  EKG here is normal.  First troponin is normal.  Remainder of her labs are normal. Repeat troponin pending Patient with history of cerebral aneurysm.  CT angio is being obtained. If repeat troponin normal and CT angio unchanged from prior, patient will be discharged Discussed with Dr. Maurie Boettcher who will follow up on above tests and disposition as appropriate Final Clinical Impression(s) / ED Diagnoses Final diagnoses:  Near syncope  Heat stroke, initial encounter    Rx / DC Orders ED Discharge Orders     None        Pattricia Boss, MD 09/20/20 2303

## 2020-10-17 ENCOUNTER — Ambulatory Visit (INDEPENDENT_AMBULATORY_CARE_PROVIDER_SITE_OTHER): Payer: BLUE CROSS/BLUE SHIELD

## 2020-10-17 ENCOUNTER — Ambulatory Visit
Admission: EM | Admit: 2020-10-17 | Discharge: 2020-10-17 | Disposition: A | Discharge status: Home / Self Care | Payer: BLUE CROSS/BLUE SHIELD

## 2020-10-17 ENCOUNTER — Other Ambulatory Visit: Payer: Self-pay

## 2020-10-17 ENCOUNTER — Encounter: Payer: Self-pay | Admitting: Emergency Medicine

## 2020-10-17 DIAGNOSIS — M79641 Pain in right hand: Secondary | ICD-10-CM

## 2020-10-17 NOTE — ED Provider Notes (Signed)
[facility]    CSN: [id number] Arrival date & time: 10/17/20  1139      History   Chief Complaint Chief Complaint  Patient presents with   Hand Pain    HPI Vicki Mack is a 45 y.o. female.   Patient presents with right hand pain that has been present for approximately 4 days.  Pain is present on the dorsal surface of the hand and fingers are not involved.  Patient denies any numbness or tingling.  Denies any known recent or past injury.  Denies any history of arthritis.  Pain is described as "throbbing".  Patient has not taken any over-the-counter medications to alleviate pain.   Hand Pain   Past Medical History:  Diagnosis Date   Abnormal Pap smear    colpo with biopsy at age 64   Aneurysm [facility])    Bipolar 1 disorder ([place])    Infertility    able to become pregnant with Clomid   Low iron    PTSD (post-traumatic stress disorder)    seeing neuropsychologist    Patient Active Problem List   Diagnosis Date Noted   Intracranial aneurysm 10/12/2018   Viral URI with cough 05/03/2018   Migraine headache without aura 08/27/2011   SAB (spontaneous abortion) 08/27/2011    Past Surgical History:  Procedure Laterality Date   NO PAST SURGERIES      OB History     Gravida  3   Para  1   Term  1   Preterm      AB  2   Living  1      SAB  2   IAB      Ectopic      Multiple      Live Births  1            Home Medications    Prior to Admission medications   Medication Sig Start Date End Date Taking? Authorizing Provider  acetaminophen (TYLENOL) 500 MG tablet Take 500 mg by mouth every 6 (six) hours as needed for headache (pain).   Yes [provider]  diphenhydramine-acetaminophen (TYLENOL PM) 25-500 MG TABS tablet Take 1 tablet by mouth at bedtime as needed (sleep).   Yes [provider]  Levonorgestrel (LILETTA, 52 MG, IU) 1 application by Intrauterine route once. Since June 2017   Yes [provider]  metoprolol succinate (TOPROL-XL) 25 MG 24 hr tablet Take 1 tablet by mouth daily. 04/04/20  Yes [provider]  Multiple Vitamins-Minerals (HAIR/SKIN/NAILS/BIOTIN) TABS Take 1 tablet by mouth daily.   Yes [provider]  ondansetron (ZOFRAN) 4 MG tablet Take 1 tablet (4 mg total) by mouth every 6 (six) hours. 05/27/19  Yes Madilyn Hook A, PA-C  clindamycin (CLEOCIN) 300 MG capsule Take 300 mg by mouth 4 (four) times daily. Until gone 09/20/18   [provider]  ibuprofen (ADVIL,MOTRIN) 400 MG tablet Take 1 tablet (400 mg total) by mouth every 8 (eight) hours as needed for moderate pain. Patient not taking: Reported on 09/30/2018 07/04/17   Ripley Fraise, MD    Family History Family History  Problem Relation Age of Onset   Diabetes Mother    Hypertension Mother    Diabetes Father    Hypertension Father    Alzheimer's disease Father    Other Sister        breast cysts   Breast cancer Paternal Aunt        had mastectomy  Alzheimer's disease Paternal Aunt     Social History Social History   Tobacco Use   Smoking status: Never   Smokeless tobacco: Never  Vaping Use   Vaping Use: Never used  Substance Use Topics   Alcohol use: No   Drug use: No     Allergies   Dilaudid [hydromorphone hcl], Phenergan [promethazine hcl], Codeine, Penicillins, and Sulfa antibiotics   Review of Systems Review of Systems Per HPI  Physical Exam Triage Vital Signs ED Triage Vitals  Enc Vitals Group     BP 10/17/20 1309 134/85     Pulse Rate 10/17/20 1309 (!) 56     Resp 10/17/20 1309 18     Temp 10/17/20 1309 97.8 F (36.6 C)     Temp Source 10/17/20 1309 Oral     SpO2 10/17/20 1309 98 %     Weight 10/17/20 1311 141 lb (64 kg)     Height 10/17/20 1311 '5\' 6"'$  (1.676 m)     Head Circumference --      Peak Flow --      Pain Score 10/17/20 1311 9     Pain Loc --      Pain Edu? --      Excl. in [place]? --    No data found.  Updated Vital  Signs BP 134/85 (BP Location: Right Arm)   Pulse (!) 56   Temp 97.8 F (36.6 C) (Oral)   Resp 18   Ht '5\' 6"'$  (1.676 m)   Wt 141 lb (64 kg)   SpO2 98%   BMI 22.76 kg/m   Visual Acuity Right Eye Distance:   Left Eye Distance:   Bilateral Distance:    Right Eye Near:   Left Eye Near:    Bilateral Near:     Physical Exam Constitutional:      Appearance: Normal appearance.  HENT:     Head: Normocephalic and atraumatic.  Eyes:     Extraocular Movements: Extraocular movements intact.     Conjunctiva/sclera: Conjunctivae normal.  Pulmonary:     Effort: Pulmonary effort is normal.  Musculoskeletal:     Right hand: Swelling, tenderness and bony tenderness present. No deformity. Normal strength. Normal sensation. Normal capillary refill. Normal pulse.     Left hand: Normal.     Comments: Tenderness to palpation across dorsal surface of hand.  Neurovascular intact.  Mild swelling noted to the first and second metacarpal area.  Neurological:     General: No focal deficit present.     Mental Status: She is alert and oriented to person, place, and time. Mental status is at baseline.  Psychiatric:        Mood and Affect: Mood normal.        Behavior: Behavior normal.        Thought Content: Thought content normal.        Judgment: Judgment normal.     UC Treatments / Results  Labs (all labs ordered are listed, but only abnormal results are displayed) Labs Reviewed - No data to display  EKG   Radiology DG Hand Complete Right  Result Date: 10/17/2020 CLINICAL DATA:  RIGHT hand pain EXAM: RIGHT HAND - COMPLETE 3+ VIEW COMPARISON:  Throbbing and painful hand FINDINGS: No evidence of fracture of the carpal or metacarpal bones. Radiocarpal joint is intact. Phalanges are normal. No soft tissue injury. IMPRESSION: No acute osseous abnormality.  No soft tissue abnormality. Electronically Signed   By: Suzy Bouchard M.D.   On:  10/17/2020 14:01    Procedures Procedures (including  critical care time)  Medications Ordered in UC Medications - No data to display  Initial Impression / Assessment and Plan / UC Course  I have reviewed the triage vital signs and the nursing notes.  Pertinent labs & imaging results that were available during my care of the patient were reviewed by me and considered in my medical decision making (see chart for details).     Right hand x-ray was negative for any bony abnormality. There is risk associated with patient taking NSAIDs due to history of brain aneurysm.  Advised patient to take over-the-counter Tylenol and use ice application to area of pain.  Patient to follow-up with PCP or urgent care if pain persists with this current treatment plan.  No red flags at this time.  No signs of a blood clot.  Neurovascular exam intact.  Suspect that inflammation could be related to arthritis.  Discussed strict return precautions. Patient verbalized understanding and is agreeable with plan.  Final Clinical Impressions(s) / UC Diagnoses   Final diagnoses:  Right hand pain     Discharge Instructions      Your x-ray was negative.  Please use Tylenol and ice application to hand and follow-up if pain persists.     ED Prescriptions   None    PDMP not reviewed this encounter.   Odis Luster, FNP 10/17/20 1429

## 2020-10-17 NOTE — Discharge Instructions (Addendum)
Your x-ray was negative.  Please use Tylenol and ice application to hand and follow-up if pain persists.

## 2020-10-17 NOTE — ED Triage Notes (Signed)
Patient states that she began having right hand swelling since Monday.  No injury.  Throbbing and very painful.  Denies any OTC meds.

## 2020-11-27 ENCOUNTER — Emergency Department (HOSPITAL_COMMUNITY): Payer: BLUE CROSS/BLUE SHIELD

## 2020-11-27 ENCOUNTER — Emergency Department (HOSPITAL_COMMUNITY)
Admission: EM | Admit: 2020-11-27 | Discharge: 2020-11-27 | Disposition: A | Discharge status: Home / Self Care | Payer: BLUE CROSS/BLUE SHIELD | Attending: Emergency Medicine | Admitting: Emergency Medicine

## 2020-11-27 ENCOUNTER — Other Ambulatory Visit: Payer: Self-pay

## 2020-11-27 ENCOUNTER — Encounter (HOSPITAL_COMMUNITY): Payer: Self-pay

## 2020-11-27 DIAGNOSIS — R200 Anesthesia of skin: Secondary | ICD-10-CM | POA: Insufficient documentation

## 2020-11-27 DIAGNOSIS — Z79899 Other long term (current) drug therapy: Secondary | ICD-10-CM | POA: Diagnosis not present

## 2020-11-27 DIAGNOSIS — Z20822 Contact with and (suspected) exposure to covid-19: Secondary | ICD-10-CM | POA: Insufficient documentation

## 2020-11-27 DIAGNOSIS — N9489 Other specified conditions associated with female genital organs and menstrual cycle: Secondary | ICD-10-CM | POA: Insufficient documentation

## 2020-11-27 DIAGNOSIS — Y900 Blood alcohol level of less than 20 mg/100 ml: Secondary | ICD-10-CM | POA: Insufficient documentation

## 2020-11-27 DIAGNOSIS — R202 Paresthesia of skin: Secondary | ICD-10-CM | POA: Diagnosis not present

## 2020-11-27 LAB — RESP PANEL BY RT-PCR (FLU A&B, COVID) ARPGX2
Influenza A by PCR: NEGATIVE
Influenza B by PCR: NEGATIVE
SARS Coronavirus 2 by RT PCR: NEGATIVE

## 2020-11-27 LAB — ETHANOL: Alcohol, Ethyl (B): <10 mg/dL (ref <10)

## 2020-11-27 LAB — COMPREHENSIVE METABOLIC PANEL
ALT: 9 U/L (ref 0–44)
AST: 19 U/L (ref 15–41)
Albumin: 3.6 g/dL (ref 3.5–5.0)
Alkaline Phosphatase: 65 U/L (ref 38–126)
Anion gap: 6 (ref 5–15)
BUN: 8 mg/dL (ref 6–20)
CO2: 27 mmol/L (ref 22–32)
Calcium: 8.7 mg/dL — ABNORMAL LOW (ref 8.9–10.3)
Chloride: 104 mmol/L (ref 98–111)
Creatinine, Ser: 0.93 mg/dL (ref 0.44–1.00)
GFR, Estimated: >60 mL/min (ref >60)
Glucose, Bld: 87 mg/dL (ref 70–99)
Potassium: 3.9 mmol/L (ref 3.5–5.1)
Sodium: 137 mmol/L (ref 135–145)
Total Bilirubin: 0.5 mg/dL (ref 0.3–1.2)
Total Protein: 6.4 g/dL — ABNORMAL LOW (ref 6.5–8.1)

## 2020-11-27 LAB — CBC
HCT: 41.6 % (ref 36.0–46.0)
Hemoglobin: 13.7 g/dL (ref 12.0–15.0)
MCH: 29.7 pg (ref 26.0–34.0)
MCHC: 32.9 g/dL (ref 30.0–36.0)
MCV: 90.2 fL (ref 80.0–100.0)
Platelets: 251 10*3/uL (ref 150–400)
RBC: 4.61 MIL/uL (ref 3.87–5.11)
RDW: 13.6 % (ref 11.5–15.5)
WBC: 6.9 10*3/uL (ref 4.0–10.5)
nRBC: 0 % (ref 0.0–0.2)

## 2020-11-27 LAB — URINALYSIS, ROUTINE W REFLEX MICROSCOPIC
Bacteria, UA: NONE SEEN
Bilirubin Urine: NEGATIVE
Glucose, UA: NEGATIVE mg/dL
Ketones, ur: NEGATIVE mg/dL
Leukocytes,Ua: NEGATIVE
Nitrite: NEGATIVE
Protein, ur: 30 mg/dL — AB
Specific Gravity, Urine: 1.016 (ref 1.005–1.030)
pH: 9 — ABNORMAL HIGH (ref 5.0–8.0)

## 2020-11-27 LAB — RAPID URINE DRUG SCREEN, HOSP PERFORMED
Amphetamines: NOT DETECTED
Barbiturates: NOT DETECTED
Benzodiazepines: NOT DETECTED
Cocaine: NOT DETECTED
Opiates: NOT DETECTED
Tetrahydrocannabinol: NOT DETECTED

## 2020-11-27 LAB — DIFFERENTIAL
Abs Immature Granulocytes: 0.02 10*3/uL (ref 0.00–0.07)
Basophils Absolute: 0.1 10*3/uL (ref 0.0–0.1)
Basophils Relative: 1 %
Eosinophils Absolute: 0.3 10*3/uL (ref 0.0–0.5)
Eosinophils Relative: 5 %
Immature Granulocytes: 0 %
Lymphocytes Relative: 35 %
Lymphs Abs: 2.4 10*3/uL (ref 0.7–4.0)
Monocytes Absolute: 0.7 10*3/uL (ref 0.1–1.0)
Monocytes Relative: 10 %
Neutro Abs: 3.4 10*3/uL (ref 1.7–7.7)
Neutrophils Relative %: 49 %

## 2020-11-27 LAB — I-STAT BETA HCG BLOOD, ED (MC, WL, AP ONLY): I-stat hCG, quantitative: <5 m[IU]/mL (ref <5)

## 2020-11-27 LAB — PROTIME-INR
INR: 1 (ref 0.8–1.2)
Prothrombin Time: 13 seconds (ref 11.4–15.2)

## 2020-11-27 LAB — APTT: aPTT: 26 seconds (ref 24–36)

## 2020-11-27 NOTE — ED Triage Notes (Signed)
Pt arrived via [facility] from her dr office for HA, blurred vision in left eye, and left sided weaknessx5days. Per EM pt states she has also had loss off appetite and sleep since then. Per EMS pt has left arm weakness and pt had hard time elevating left leg. Pt is A&Ox4. VSS.

## 2020-11-27 NOTE — Discharge Instructions (Signed)
You were seen in the emergency room today with numbness in the left side.  Your MRI did not show evidence of stroke.  I would like for you to keep your follow-up appointment with the neurologist in November.  If you develop any new or suddenly worsening symptoms please return to the emergency department for evaluation or call 911.

## 2020-11-27 NOTE — ED Provider Notes (Signed)
Emergency Department Provider Note   I have reviewed the triage vital signs and the nursing notes.   HISTORY  Chief Complaint Stroke Symptoms   HPI Vicki Mack is a 45 y.o. female with past medical history reviewed below presents to the emergency department with left face, arm, leg numbness along with some weakness.  Patient has noticed some decreased vision in the left visual field as well.  She notes that symptoms began on Friday.  She went to her primary care doctor for evaluation but tells me that she had a temp of 99.5 F and was sent home to have a virtual visit with concern for fever.  She had a phone call with her PCP but continued to have symptoms, described above, through the weekend.  She saw her PCP today for blood work and described to the left side weakness.  At that time, she was transferred to the emergency department for evaluation.  She notes that over the past several days she has had pins/needle type headache pain mostly posterior.  No speech disturbance. No radiation of symptoms or modifying factors.   Past Medical History:  Diagnosis Date   Abnormal Pap smear    colpo with biopsy at age 54   Aneurysm [facility])    Bipolar 1 disorder ([place])    Infertility    able to become pregnant with Clomid   Low iron    PTSD (post-traumatic stress disorder)    seeing neuropsychologist    Patient Active Problem List   Diagnosis Date Noted   Intracranial aneurysm 10/12/2018   Viral URI with cough 05/03/2018   Migraine headache without aura 08/27/2011   SAB (spontaneous abortion) 08/27/2011    Past Surgical History:  Procedure Laterality Date   NO PAST SURGERIES      Allergies Dilaudid [hydromorphone hcl], Phenergan [promethazine hcl], Codeine, Penicillins, and Sulfa antibiotics  Family History  Problem Relation Age of Onset   Diabetes Mother    Hypertension Mother    Diabetes Father    Hypertension Father    Alzheimer's disease Father    Other Sister         breast cysts   Breast cancer Paternal Aunt        had mastectomy   Alzheimer's disease Paternal Aunt     Social History Social History   Tobacco Use   Smoking status: Never   Smokeless tobacco: Never  Vaping Use   Vaping Use: Never used  Substance Use Topics   Alcohol use: No   Drug use: No    Review of Systems  Constitutional: No fever/chills Eyes: Positive visual changes. ENT: No sore throat. Cardiovascular: Denies chest pain. Respiratory: Denies shortness of breath. Gastrointestinal: No abdominal pain.  No nausea, no vomiting.  No diarrhea.  No constipation. Genitourinary: Negative for dysuria. Musculoskeletal: Negative for back pain. Skin: Negative for rash. Neurological: Positive intermittent HA. Left side weakness/numbness along with left visual field change.   10-point ROS otherwise negative.  ____________________________________________   PHYSICAL EXAM:  VITAL SIGNS: ED Triage Vitals  Enc Vitals Group     BP 11/27/20 1555 136/87     Pulse Rate 11/27/20 1555 62     Resp 11/27/20 1555 16     Temp 11/27/20 1555 98 F (36.7 C)     Temp Source 11/27/20 1555 Oral     SpO2 11/27/20 1552 99 %     Weight 11/27/20 1557 141 lb (64 kg)     Height 11/27/20 1557 5'  7" (1.702 m)   Constitutional: Alert and oriented. Well appearing and in no acute distress. Eyes: Conjunctivae are normal. PERRL. EOMI. No visual field deficits.  Head: Atraumatic. Nose: No congestion/rhinnorhea. Mouth/Throat: Mucous membranes are moist.   Neck: No stridor.   Cardiovascular: Normal rate, regular rhythm. Good peripheral circulation. Grossly normal heart sounds.   Respiratory: Normal respiratory effort.  No retractions. Lungs CTAB. Gastrointestinal: Soft and nontender. No distention.  Musculoskeletal: No lower extremity tenderness nor edema. No gross deformities of extremities. Neurologic:  Normal speech and language. Decreased sensation to the left face, left arm, left leg  compared to the right.  5/5 strength in the bilateral upper/lower extremities. No appreciable drift.  Normal finger-to-nose.  Skin:  Skin is warm, dry and intact. No rash noted.   ____________________________________________   LABS (all labs ordered are listed, but only abnormal results are displayed)  Labs Reviewed  COMPREHENSIVE METABOLIC PANEL - Abnormal; Notable for the following components:      Result Value   Calcium 8.7 (*)    Total Protein 6.4 (*)    All other components within normal limits  URINALYSIS, ROUTINE W REFLEX MICROSCOPIC - Abnormal; Notable for the following components:   APPearance HAZY (*)    pH 9.0 (*)    Hgb urine dipstick MODERATE (*)    Protein, ur 30 (*)    All other components within normal limits  RESP PANEL BY RT-PCR (FLU A&B, COVID) ARPGX2  ETHANOL  PROTIME-INR  APTT  CBC  DIFFERENTIAL  RAPID URINE DRUG SCREEN, HOSP PERFORMED  I-STAT BETA HCG BLOOD, ED ([facility], [facility], [facility] ONLY)   ____________________________________________  EKG   EKG Interpretation  Date/Time:  Wednesday November 27 2020 16:25:49 EDT Ventricular Rate:  65 PR Interval:  151 QRS Duration: 95 QT Interval:  415 QTC Calculation: 432 R Axis:   67 Text Interpretation: Sinus rhythm Confirmed by Nanda Quinton [phone]) on 11/27/2020 5:31:31 PM        ____________________________________________  RADIOLOGY  MRI brain reviewed. No acute findings.   ____________________________________________   PROCEDURES  Procedure(s) performed:   Procedures  None  ____________________________________________   INITIAL IMPRESSION / ASSESSMENT AND PLAN / ED COURSE  Pertinent labs & imaging results that were available during my care of the patient were reviewed by me and considered in my medical decision making (see chart for details).   Patient presents emergency department with strokelike symptoms starting Friday.  She is well outside the tPA or LVO intervention window.  Differential  includes stroke but also complex migraine, demyelinating disease, hemorrhage.  Plan to move forward with MRI brain given the time since symptoms began.  We will obtain lab work and discuss with neurology after scans are back.  Labs and imaging reviewed. MRI without evidence of CVA. Patient has a neurology appointment in November. Will keep that appointment. Discussed strict ED return precautions.  ____________________________________________  FINAL CLINICAL IMPRESSION(S) / ED DIAGNOSES  Final diagnoses:  Numbness and tingling    Note:  This document was prepared using Dragon voice recognition software and may include unintentional dictation errors.  Nanda Quinton, MD, [facility]    Hakop Humbarger, [place], MD 12/04/20 [phone]

## 2021-01-31 ENCOUNTER — Ambulatory Visit
Admission: EM | Admit: 2021-01-31 | Discharge: 2021-01-31 | Disposition: A | Discharge status: Home / Self Care | Payer: BLUE CROSS/BLUE SHIELD

## 2021-01-31 ENCOUNTER — Other Ambulatory Visit: Payer: Self-pay

## 2021-01-31 DIAGNOSIS — J069 Acute upper respiratory infection, unspecified: Secondary | ICD-10-CM | POA: Diagnosis not present

## 2021-01-31 HISTORY — DX: Essential (primary) hypertension: I10

## 2021-01-31 MED ORDER — BENZONATATE 100 MG PO CAPS: 100.0000 mg | ORAL_CAPSULE | Freq: Three times a day (TID) | ORAL | 0 refills | Status: DC | PRN

## 2021-01-31 MED ORDER — FLUTICASONE PROPIONATE 50 MCG/ACT NA SUSP: 1.0000 | Freq: Every day | NASAL | 0 refills | Status: DC

## 2021-01-31 NOTE — Discharge Instructions (Signed)
It appears that you have a viral upper respiratory infection that should resolve in the next few days with symptomatic treatment.  You have been prescribed 2 medications to help alleviate your symptoms.  Your COVID-19 and flu test is pending.  We will call if it is positive.

## 2021-01-31 NOTE — ED Triage Notes (Signed)
Pt c/o cough, nasal congestion with drainage, chills, headache,   Denies sore throat, ear aches, nausea, vomiting, diarrhea, constipation, lethargy.   Onset yesterday

## 2021-01-31 NOTE — ED Provider Notes (Signed)
[facility]    CSN: [id number] Arrival date & time: 01/31/21  6295      History   Chief Complaint Chief Complaint  Patient presents with   Cough    HPI Vicki Mack is a 45 y.o. female.   Patient presents with nonproductive cough, nasal congestion, headache, chills that started yesterday.  Denies chest pain, shortness of breath, sore throat, ear pain, nausea, vomiting, diarrhea, abdominal pain.  Denies any known fevers or sick contacts.  Patient has taken Sudafed and TheraFlu with minimal improvement in symptoms.   Cough  Past Medical History:  Diagnosis Date   Abnormal Pap smear    colpo with biopsy at age 17   Aneurysm [facility])    Bipolar 1 disorder ([place])    Hypertension    Infertility    able to become pregnant with Clomid   Low iron    PTSD (post-traumatic stress disorder)    seeing neuropsychologist    Patient Active Problem List   Diagnosis Date Noted   Intracranial aneurysm 10/12/2018   Viral URI with cough 05/03/2018   Migraine headache without aura 08/27/2011   SAB (spontaneous abortion) 08/27/2011    Past Surgical History:  Procedure Laterality Date   NO PAST SURGERIES      OB History     Gravida  3   Para  1   Term  1   Preterm      AB  2   Living  1      SAB  2   IAB      Ectopic      Multiple      Live Births  1            Home Medications    Prior to Admission medications   Medication Sig Start Date End Date Taking? Authorizing Provider  benzonatate (TESSALON) 100 MG capsule Take 1 capsule (100 mg total) by mouth every 8 (eight) hours as needed for cough. 01/31/21  Yes Dallie Patton, Hildred Alamin E, FNP  fluticasone (FLONASE) 50 MCG/ACT nasal spray Place 1 spray into both nostrils daily for 3 days. 01/31/21 02/03/21 Yes Jaelen Gellerman, Michele Rockers, FNP  acetaminophen (TYLENOL) 500 MG tablet Take 500 mg by mouth every 6 (six) hours as needed for headache (pain).    [provider]  baclofen (LIORESAL) 10 MG tablet  Take 10 mg by mouth 3 (three) times daily. 01/30/21   [provider]  clindamycin (CLEOCIN) 300 MG capsule Take 300 mg by mouth 4 (four) times daily. Until gone 09/20/18   [provider]  diphenhydramine-acetaminophen (TYLENOL PM) 25-500 MG TABS tablet Take 1 tablet by mouth at bedtime as needed (sleep).    [provider]  DULoxetine (CYMBALTA) 60 MG capsule Take 60 mg by mouth daily. 01/14/21   [provider]  ibuprofen (ADVIL,MOTRIN) 400 MG tablet Take 1 tablet (400 mg total) by mouth every 8 (eight) hours as needed for moderate pain. Patient not taking: Reported on 09/30/2018 07/04/17   Ripley Fraise, MD  Levonorgestrel (LILETTA, 52 MG, IU) 1 application by Intrauterine route once. Since June 2017    [provider]  metoprolol succinate (TOPROL-XL) 25 MG 24 hr tablet Take 1 tablet by mouth daily. 04/04/20   [provider]  Multiple Vitamins-Minerals (HAIR/SKIN/NAILS/BIOTIN) TABS Take 1 tablet by mouth daily.    [provider]  ondansetron (ZOFRAN) 4 MG tablet Take 1 tablet (4 mg total) by mouth every 6 (six) hours. 05/27/19  Alveria Apley, PA-C    Family History Family History  Problem Relation Age of Onset   Diabetes Mother    Hypertension Mother    Diabetes Father    Hypertension Father    Alzheimer's disease Father    Other Sister        breast cysts   Breast cancer Paternal Aunt        had mastectomy   Alzheimer's disease Paternal Aunt     Social History Social History   Tobacco Use   Smoking status: Never   Smokeless tobacco: Never  Vaping Use   Vaping Use: Never used  Substance Use Topics   Alcohol use: No   Drug use: No     Allergies   Dilaudid [hydromorphone hcl], Phenergan [promethazine hcl], Codeine, Penicillins, and Sulfa antibiotics   Review of Systems Review of Systems Per HPI  Physical Exam Triage Vital Signs ED Triage Vitals  Enc Vitals Group     BP 01/31/21 0855 134/85      Pulse Rate 01/31/21 0855 74     Resp 01/31/21 0855 18     Temp 01/31/21 0855 98.6 F (37 C)     Temp Source 01/31/21 0855 Oral     SpO2 01/31/21 0855 98 %     Weight --      Height --      Head Circumference --      Peak Flow --      Pain Score 01/31/21 0856 0     Pain Loc --      Pain Edu? --      Excl. in [place]? --    No data found.  Updated Vital Signs BP 134/85 (BP Location: Left Arm)    Pulse 74    Temp 98.6 F (37 C) (Oral)    Resp 18    SpO2 98%   Visual Acuity Right Eye Distance:   Left Eye Distance:   Bilateral Distance:    Right Eye Near:   Left Eye Near:    Bilateral Near:     Physical Exam Constitutional:      General: She is not in acute distress.    Appearance: Normal appearance. She is not toxic-appearing or diaphoretic.  HENT:     Head: Normocephalic and atraumatic.     Right Ear: Tympanic membrane and ear canal normal.     Left Ear: Tympanic membrane and ear canal normal.     Nose: Congestion present.     Mouth/Throat:     Mouth: Mucous membranes are moist.     Pharynx: No posterior oropharyngeal erythema.  Eyes:     Extraocular Movements: Extraocular movements intact.     Conjunctiva/sclera: Conjunctivae normal.     Pupils: Pupils are equal, round, and reactive to light.  Cardiovascular:     Rate and Rhythm: Normal rate and regular rhythm.     Pulses: Normal pulses.     Heart sounds: Normal heart sounds.  Pulmonary:     Effort: Pulmonary effort is normal. No respiratory distress.     Breath sounds: Normal breath sounds. No stridor. No wheezing, rhonchi or rales.  Abdominal:     General: Abdomen is flat. Bowel sounds are normal.     Palpations: Abdomen is soft.  Musculoskeletal:        General: Normal range of motion.     Cervical back: Normal range of motion.  Skin:    General: Skin is warm and dry.  Neurological:  General: No focal deficit present.     Mental Status: She is alert and oriented to person, place, and time. Mental status  is at baseline.  Psychiatric:        Mood and Affect: Mood normal.        Behavior: Behavior normal.     UC Treatments / Results  Labs (all labs ordered are listed, but only abnormal results are displayed) Labs Reviewed  COVID-19, FLU A+B NAA    EKG   Radiology No results found.  Procedures Procedures (including critical care time)  Medications Ordered in UC Medications - No data to display  Initial Impression / Assessment and Plan / UC Course  I have reviewed the triage vital signs and the nursing notes.  Pertinent labs & imaging results that were available during my care of the patient were reviewed by me and considered in my medical decision making (see chart for details).     Patient presents with symptoms likely from a viral upper respiratory infection. Differential includes bacterial pneumonia, sinusitis, allergic rhinitis, covid 19, flu. Do not suspect underlying cardiopulmonary process. Symptoms seem unlikely related to ACS, CHF or COPD exacerbations, pneumonia, pneumothorax. Patient is nontoxic appearing and not in need of emergent medical intervention.  COVID-19 and flu test pending.  Recommended symptom control with over the counter medications that are safe with high blood pressure.  Patient offered prescriptions.  Return if symptoms fail to improve in 1-2 weeks or you develop shortness of breath, chest pain, severe headache. Patient states understanding and is agreeable.  Discharged with PCP followup.  Final Clinical Impressions(s) / UC Diagnoses   Final diagnoses:  Viral upper respiratory tract infection with cough     Discharge Instructions      It appears that you have a viral upper respiratory infection that should resolve in the next few days with symptomatic treatment.  You have been prescribed 2 medications to help alleviate your symptoms.  Your COVID-19 and flu test is pending.  We will call if it is positive.    ED Prescriptions      Medication Sig Dispense Auth. Provider   benzonatate (TESSALON) 100 MG capsule Take 1 capsule (100 mg total) by mouth every 8 (eight) hours as needed for cough. 21 capsule [place], [place] E, [place]   fluticasone [facility]) 50 MCG/ACT nasal spray Place 1 spray into both nostrils daily for 3 days. 16 g Teodora Medici, [place]      PDMP not reviewed this encounter.   Teodora Medici, [place] 01/31/21 [phone]

## 2021-02-01 LAB — COVID-19, FLU A+B NAA
Influenza A, NAA: NOT DETECTED
Influenza B, NAA: NOT DETECTED
SARS-CoV-2, NAA: NOT DETECTED

## 2021-02-11 ENCOUNTER — Ambulatory Visit
Admission: EM | Admit: 2021-02-11 | Discharge: 2021-02-11 | Disposition: A | Discharge status: Home / Self Care | Payer: BLUE CROSS/BLUE SHIELD | Attending: Internal Medicine | Admitting: Internal Medicine

## 2021-02-11 ENCOUNTER — Ambulatory Visit (INDEPENDENT_AMBULATORY_CARE_PROVIDER_SITE_OTHER): Payer: BLUE CROSS/BLUE SHIELD

## 2021-02-11 ENCOUNTER — Other Ambulatory Visit: Payer: Self-pay

## 2021-02-11 DIAGNOSIS — R051 Acute cough: Secondary | ICD-10-CM | POA: Diagnosis not present

## 2021-02-11 DIAGNOSIS — R079 Chest pain, unspecified: Secondary | ICD-10-CM | POA: Diagnosis not present

## 2021-02-11 DIAGNOSIS — W19XXXA Unspecified fall, initial encounter: Secondary | ICD-10-CM

## 2021-02-11 MED ORDER — BENZONATATE 100 MG PO CAPS: 200.0000 mg | ORAL_CAPSULE | Freq: Three times a day (TID) | ORAL | 0 refills | Status: DC | PRN

## 2021-02-11 NOTE — ED Provider Notes (Signed)
[facility]    CSN: [id number] Arrival date & time: 02/11/21  1202      History   Chief Complaint Chief Complaint  Patient presents with   Cough    HPI Vicki Mack is a 45 y.o. female.   Patient presents with nonproductive cough that started last night.  Also having some associated nasal congestion.  Patient was seen on 01/31/2021 with viral upper respiratory infection.  Patient reports that the symptoms resolved, and these are new symptoms.  Denies fever, chest pain, shortness of breath.  Denies any known sick contacts.  Patient has been taking Tessalon Perles with minimal improvement in symptoms.   Cough  Past Medical History:  Diagnosis Date   Abnormal Pap smear    colpo with biopsy at age 63   Aneurysm [facility])    Bipolar 1 disorder ([place])    Hypertension    Infertility    able to become pregnant with Clomid   Low iron    PTSD (post-traumatic stress disorder)    seeing neuropsychologist    Patient Active Problem List   Diagnosis Date Noted   Intracranial aneurysm 10/12/2018   Viral URI with cough 05/03/2018   Migraine headache without aura 08/27/2011   SAB (spontaneous abortion) 08/27/2011    Past Surgical History:  Procedure Laterality Date   NO PAST SURGERIES      OB History     Gravida  3   Para  1   Term  1   Preterm      AB  2   Living  1      SAB  2   IAB      Ectopic      Multiple      Live Births  1            Home Medications    Prior to Admission medications   Medication Sig Start Date End Date Taking? Authorizing Provider  benzonatate (TESSALON) 100 MG capsule Take 2 capsules (200 mg total) by mouth every 8 (eight) hours as needed for cough. 02/11/21  Yes Mell Mellott, Hildred Alamin E, FNP  acetaminophen (TYLENOL) 500 MG tablet Take 500 mg by mouth every 6 (six) hours as needed for headache (pain).    [provider]  baclofen (LIORESAL) 10 MG tablet Take 10 mg by mouth 3 (three) times daily.  01/30/21   [provider]  clindamycin (CLEOCIN) 300 MG capsule Take 300 mg by mouth 4 (four) times daily. Until gone 09/20/18   [provider]  diphenhydramine-acetaminophen (TYLENOL PM) 25-500 MG TABS tablet Take 1 tablet by mouth at bedtime as needed (sleep).    [provider]  DULoxetine (CYMBALTA) 60 MG capsule Take 60 mg by mouth daily. 01/14/21   [provider]  fluticasone (FLONASE) 50 MCG/ACT nasal spray Place 1 spray into both nostrils daily for 3 days. 01/31/21 02/03/21  Teodora Medici, FNP  ibuprofen (ADVIL,MOTRIN) 400 MG tablet Take 1 tablet (400 mg total) by mouth every 8 (eight) hours as needed for moderate pain. Patient not taking: Reported on 09/30/2018 07/04/17   Ripley Fraise, MD  Levonorgestrel (LILETTA, 52 MG, IU) 1 application by Intrauterine route once. Since June 2017    [provider]  metoprolol succinate (TOPROL-XL) 25 MG 24 hr tablet Take 1 tablet by mouth daily. 04/04/20   [provider]  Multiple Vitamins-Minerals (HAIR/SKIN/NAILS/BIOTIN) TABS Take 1 tablet by mouth daily.    [provider]  ondansetron [facility]) 4  MG tablet Take 1 tablet (4 mg total) by mouth every 6 (six) hours. 05/27/19   Alveria Apley, PA-C    Family History Family History  Problem Relation Age of Onset   Diabetes Mother    Hypertension Mother    Diabetes Father    Hypertension Father    Alzheimer's disease Father    Other Sister        breast cysts   Breast cancer Paternal Aunt        had mastectomy   Alzheimer's disease Paternal Aunt     Social History Social History   Tobacco Use   Smoking status: Never   Smokeless tobacco: Never  Vaping Use   Vaping Use: Never used  Substance Use Topics   Alcohol use: No   Drug use: No     Allergies   Dilaudid [hydromorphone hcl], Phenergan [promethazine hcl], Codeine, Penicillins, and Sulfa antibiotics   Review of Systems Review of Systems Per HPI  Physical  Exam Triage Vital Signs ED Triage Vitals  Enc Vitals Group     BP 02/11/21 1241 (!) 134/93     Pulse Rate 02/11/21 1241 87     Resp 02/11/21 1241 16     Temp 02/11/21 1241 98.5 F (36.9 C)     Temp Source 02/11/21 1241 Oral     SpO2 02/11/21 1241 99 %     Weight --      Height --      Head Circumference --      Peak Flow --      Pain Score 02/11/21 1240 0     Pain Loc --      Pain Edu? --      Excl. in [place]? --    No data found.  Updated Vital Signs BP (!) 134/93 (BP Location: Left Arm)    Pulse 87    Temp 98.5 F (36.9 C) (Oral)    Resp 16    SpO2 99%   Visual Acuity Right Eye Distance:   Left Eye Distance:   Bilateral Distance:    Right Eye Near:   Left Eye Near:    Bilateral Near:     Physical Exam Constitutional:      General: She is not in acute distress.    Appearance: Normal appearance. She is not toxic-appearing or diaphoretic.  HENT:     Head: Normocephalic and atraumatic.     Right Ear: Tympanic membrane and ear canal normal.     Left Ear: Tympanic membrane and ear canal normal.     Nose: Congestion present.     Mouth/Throat:     Mouth: Mucous membranes are moist.     Pharynx: No posterior oropharyngeal erythema.  Eyes:     Extraocular Movements: Extraocular movements intact.     Conjunctiva/sclera: Conjunctivae normal.     Pupils: Pupils are equal, round, and reactive to light.  Cardiovascular:     Rate and Rhythm: Normal rate and regular rhythm.     Pulses: Normal pulses.     Heart sounds: Normal heart sounds.  Pulmonary:     Effort: Pulmonary effort is normal. No respiratory distress.     Breath sounds: Normal breath sounds. No stridor. No wheezing, rhonchi or rales.  Abdominal:     General: Abdomen is flat. Bowel sounds are normal.     Palpations: Abdomen is soft.  Musculoskeletal:        General: Normal range of motion.     Cervical back:  Normal range of motion.  Skin:    General: Skin is warm and dry.  Neurological:     General: No  focal deficit present.     Mental Status: She is alert and oriented to person, place, and time. Mental status is at baseline.  Psychiatric:        Mood and Affect: Mood normal.        Behavior: Behavior normal.     UC Treatments / Results  Labs (all labs ordered are listed, but only abnormal results are displayed) Labs Reviewed - No data to display  EKG   Radiology DG Chest 2 View  Result Date: 02/11/2021 CLINICAL DATA:  Fall. EXAM: CHEST - 2 VIEW COMPARISON:  Two-view chest x-ray 09/20/2020 FINDINGS: The heart size and mediastinal contours are within normal limits. Both lungs are clear. The visualized skeletal structures are unremarkable. IMPRESSION: No active cardiopulmonary disease. Electronically Signed   By: San Morelle M.D.   On: 02/11/2021 13:39    Procedures Procedures (including critical care time)  Medications Ordered in UC Medications - No data to display  Initial Impression / Assessment and Plan / UC Course  I have reviewed the triage vital signs and the nursing notes.  Pertinent labs & imaging results that were available during my care of the patient were reviewed by me and considered in my medical decision making (see chart for details).     Chest x-ray was negative for any acute cardiopulmonary process.  Patient reports that these are new symptoms, but suspect that these may be persistent symptoms from 1216/22.  COVID-19 and flu test were performed at previous visit so do not think that further testing is necessary at this time.  Will increase benzonatate dose from 100 mg to 200 mg as patient is limited on cold and cough medication with history of brain aneurysm.  Discussed strict return precautions.  Advised patient to follow-up with PCP if symptoms persist or worsen.  Patient verbalized understanding and was agreeable with plan. Final Clinical Impressions(s) / UC Diagnoses   Final diagnoses:  Acute cough     Discharge Instructions      Chest  x-ray was normal.  Your dose of benzonatate has been increased from 100 mg to 200 mg to help alleviate your cough.  Please discontinue the cough medication that was prescribed at your previous visit.  Start newly prescribed cough medication.  Follow-up with primary care if symptoms persist.     ED Prescriptions     Medication Sig Dispense Auth. Provider   benzonatate (TESSALON) 100 MG capsule Take 2 capsules (200 mg total) by mouth every 8 (eight) hours as needed for cough. 21 capsule [place], Michele Rockers, [place]      PDMP not reviewed this encounter.   Teodora Medici, [place] 02/11/21 1359

## 2021-02-11 NOTE — Discharge Instructions (Signed)
Chest x-ray was normal.  Your dose of benzonatate has been increased from 100 mg to 200 mg to help alleviate your cough.  Please discontinue the cough medication that was prescribed at your previous visit.  Start newly prescribed cough medication.  Follow-up with primary care if symptoms persist.

## 2021-02-11 NOTE — ED Triage Notes (Signed)
Patient presents to Urgent Care with complaints of cough since last night. Treating cough with tessalon med.   Denies fever.

## 2021-02-14 ENCOUNTER — Encounter (HOSPITAL_COMMUNITY): Payer: Self-pay | Admitting: Emergency Medicine

## 2021-02-14 ENCOUNTER — Other Ambulatory Visit: Payer: Self-pay

## 2021-02-14 ENCOUNTER — Ambulatory Visit (HOSPITAL_COMMUNITY)
Admission: EM | Admit: 2021-02-14 | Discharge: 2021-02-14 | Disposition: A | Discharge status: Home / Self Care | Payer: BLUE CROSS/BLUE SHIELD | Attending: Physician Assistant | Admitting: Physician Assistant

## 2021-02-14 DIAGNOSIS — J40 Bronchitis, not specified as acute or chronic: Secondary | ICD-10-CM | POA: Diagnosis not present

## 2021-02-14 MED ORDER — PREDNISONE 50 MG PO TABS: ORAL_TABLET | ORAL | 0 refills | Status: DC

## 2021-02-14 NOTE — ED Provider Notes (Signed)
[facility]    CSN: [id number] Arrival date & time: 02/14/21  0803      History   Chief Complaint Chief Complaint  Patient presents with   Cough    HPI Vicki Mack is a 45 y.o. female.   The history is provided by the patient. No language interpreter was used.  Cough Cough characteristics:  Non-productive Sputum characteristics:  Nondescript Severity:  Moderate Onset quality:  Gradual Duration:  2 weeks Timing:  Constant Progression:  Worsening Chronicity:  Recurrent Relieved by:  Nothing Worsened by:  Nothing  Past Medical History:  Diagnosis Date   Abnormal Pap smear    colpo with biopsy at age 58   Aneurysm ([place])    Bipolar 1 disorder ([place])    Hypertension    Infertility    able to become pregnant with Clomid   Low iron    PTSD (post-traumatic stress disorder)    seeing neuropsychologist    Patient Active Problem List   Diagnosis Date Noted   Intracranial aneurysm 10/12/2018   Viral URI with cough 05/03/2018   Migraine headache without aura 08/27/2011   SAB (spontaneous abortion) 08/27/2011    Past Surgical History:  Procedure Laterality Date   NO PAST SURGERIES      OB History     Gravida  3   Para  1   Term  1   Preterm      AB  2   Living  1      SAB  2   IAB      Ectopic      Multiple      Live Births  1            Home Medications    Prior to Admission medications   Medication Sig Start Date End Date Taking? Authorizing Provider  acetaminophen (TYLENOL) 500 MG tablet Take 500 mg by mouth every 6 (six) hours as needed for headache (pain).    [provider]  baclofen (LIORESAL) 10 MG tablet Take 10 mg by mouth 3 (three) times daily. 01/30/21   [provider]  benzonatate (TESSALON) 100 MG capsule Take 2 capsules (200 mg total) by mouth every 8 (eight) hours as needed for cough. 02/11/21   Teodora Medici, FNP  clindamycin (CLEOCIN) 300 MG capsule Take 300 mg by mouth 4  (four) times daily. Until gone 09/20/18   [provider]  diphenhydramine-acetaminophen (TYLENOL PM) 25-500 MG TABS tablet Take 1 tablet by mouth at bedtime as needed (sleep).    [provider]  DULoxetine (CYMBALTA) 60 MG capsule Take 60 mg by mouth daily. 01/14/21   [provider]  fluticasone (FLONASE) 50 MCG/ACT nasal spray Place 1 spray into both nostrils daily for 3 days. 01/31/21 02/03/21  Teodora Medici, FNP  ibuprofen (ADVIL,MOTRIN) 400 MG tablet Take 1 tablet (400 mg total) by mouth every 8 (eight) hours as needed for moderate pain. Patient not taking: Reported on 09/30/2018 07/04/17   Ripley Fraise, MD  Levonorgestrel (LILETTA, 52 MG, IU) 1 application by Intrauterine route once. Since June 2017    [provider]  metoprolol succinate (TOPROL-XL) 25 MG 24 hr tablet Take 1 tablet by mouth daily. 04/04/20   [provider]  Multiple Vitamins-Minerals (HAIR/SKIN/NAILS/BIOTIN) TABS Take 1 tablet by mouth daily.    [provider]  ondansetron (ZOFRAN) 4 MG tablet Take 1 tablet (4 mg total) by mouth every 6 (six) hours. 05/27/19   Aundra Dubin,  Floyce Stakes, PA-C    Family History Family History  Problem Relation Age of Onset   Diabetes Mother    Hypertension Mother    Diabetes Father    Hypertension Father    Alzheimer's disease Father    Other Sister        breast cysts   Breast cancer Paternal Aunt        had mastectomy   Alzheimer's disease Paternal Aunt     Social History Social History   Tobacco Use   Smoking status: Never   Smokeless tobacco: Never  Vaping Use   Vaping Use: Never used  Substance Use Topics   Alcohol use: No   Drug use: No     Allergies   Dilaudid [hydromorphone hcl], Phenergan [promethazine hcl], Codeine, Penicillins, and Sulfa antibiotics   Review of Systems Review of Systems  Respiratory:  Positive for cough.   All other systems reviewed and are negative.   Physical Exam Triage Vital  Signs ED Triage Vitals  Enc Vitals Group     BP 02/14/21 0832 (!) 136/94     Pulse Rate 02/14/21 0832 77     Resp 02/14/21 0832 16     Temp 02/14/21 0832 98.7 F (37.1 C)     Temp Source 02/14/21 0832 Oral     SpO2 02/14/21 0832 100 %     Weight --      Height --      Head Circumference --      Peak Flow --      Pain Score 02/14/21 0831 0     Pain Loc --      Pain Edu? --      Excl. in [place]? --    No data found.  Updated Vital Signs BP (!) 136/94 (BP Location: Right Arm)    Pulse 77    Temp 98.7 F (37.1 C) (Oral)    Resp 16    SpO2 100%   Visual Acuity Right Eye Distance:   Left Eye Distance:   Bilateral Distance:    Right Eye Near:   Left Eye Near:    Bilateral Near:     Physical Exam Vitals and nursing note reviewed.  Constitutional:      Appearance: She is well-developed.  HENT:     Head: Normocephalic.  Eyes:     Pupils: Pupils are equal, round, and reactive to light.  Cardiovascular:     Rate and Rhythm: Normal rate.  Pulmonary:     Effort: Pulmonary effort is normal.  Abdominal:     General: There is no distension.  Musculoskeletal:        General: Normal range of motion.     Cervical back: Normal range of motion.  Skin:    General: Skin is warm.  Neurological:     General: No focal deficit present.     Mental Status: She is alert and oriented to person, place, and time.     UC Treatments / Results  Labs (all labs ordered are listed, but only abnormal results are displayed) Labs Reviewed - No data to display  EKG   Radiology No results found.  Procedures Procedures (including critical care time)  Medications Ordered in UC Medications - No data to display  Initial Impression / Assessment and Plan / UC Course  I have reviewed the triage vital signs and the nursing notes.  Pertinent labs & imaging results that were available during my care of the patient were reviewed by  me and considered in my medical decision making (see chart for  details).      Final Clinical Impressions(s) / UC Diagnoses   Final diagnoses:  Bronchitis   Discharge Instructions   None    ED Prescriptions   None    PDMP not reviewed this encounter. An After Visit Summary was printed and given to the patient.    [name], [place] 02/14/21 [phone]

## 2021-02-14 NOTE — ED Triage Notes (Signed)
Pt presents with cough xs 2 weeks.

## 2021-02-14 NOTE — Discharge Instructions (Signed)
Return if any problems.

## 2021-06-15 ENCOUNTER — Ambulatory Visit
Admission: EM | Admit: 2021-06-15 | Discharge: 2021-06-15 | Disposition: A | Discharge status: Home / Self Care | Payer: BLUE CROSS/BLUE SHIELD | Attending: Urgent Care | Admitting: Urgent Care

## 2021-06-15 ENCOUNTER — Encounter: Payer: Self-pay | Admitting: Emergency Medicine

## 2021-06-15 ENCOUNTER — Other Ambulatory Visit: Payer: Self-pay

## 2021-06-15 DIAGNOSIS — T148XXA Other injury of unspecified body region, initial encounter: Secondary | ICD-10-CM | POA: Diagnosis not present

## 2021-06-15 DIAGNOSIS — Z23 Encounter for immunization: Secondary | ICD-10-CM

## 2021-06-15 MED ORDER — TETANUS-DIPHTH-ACELL PERTUSSIS 5-2.5-18.5 LF-MCG/0.5 IM SUSY
0.5000 mL | PREFILLED_SYRINGE | Freq: Once | INTRAMUSCULAR | Status: AC
  Administered 2021-06-15: 0.5 mL via INTRAMUSCULAR

## 2021-06-15 MED ORDER — ACETAMINOPHEN 325 MG PO TABS: 650.0000 mg | ORAL_TABLET | Freq: Four times a day (QID) | ORAL | 0 refills | Status: AC | PRN

## 2021-06-15 NOTE — ED Triage Notes (Signed)
Pt here for left foot pain with swelling after stepping on tooth pick yesterday  ?

## 2021-06-15 NOTE — ED Provider Notes (Signed)
?[place] ? ? ?MRN: [id number] DOB: Apr 11, 1975 ? ?Subjective:  ? ?Vicki Mack is a 46 y.o. female presenting for 1 day history of suffering a left foot injury.  Patient states that she was walking through her backyard and accidentally stepped on a toothpick that punctured her left foot.  She pulled it out and noticed that it was bleeding.  She cleaned the wound still and wanted to make sure she got evaluated today.  Cannot recall her last Tdap.  She continues to feel some pain.  No fever, drainage of pus or bleeding.  Has not taken anything for pain.  She also did not apply any particular ointments or creams. ? ?No current facility-administered medications for this encounter. ? ?Current Outpatient Medications:  ?  acetaminophen (TYLENOL) 500 MG tablet, Take 500 mg by mouth every 6 (six) hours as needed for headache (pain)., Disp: , Rfl:  ?  baclofen (LIORESAL) 10 MG tablet, Take 10 mg by mouth 3 (three) times daily., Disp: , Rfl:  ?  benzonatate (TESSALON) 100 MG capsule, Take 2 capsules (200 mg total) by mouth every 8 (eight) hours as needed for cough., Disp: 21 capsule, Rfl: 0 ?  clindamycin (CLEOCIN) 300 MG capsule, Take 300 mg by mouth 4 (four) times daily. Until gone, Disp: , Rfl:  ?  diphenhydramine-acetaminophen (TYLENOL PM) 25-500 MG TABS tablet, Take 1 tablet by mouth at bedtime as needed (sleep)., Disp: , Rfl:  ?  DULoxetine (CYMBALTA) 60 MG capsule, Take 60 mg by mouth daily., Disp: , Rfl:  ?  fluticasone (FLONASE) 50 MCG/ACT nasal spray, Place 1 spray into both nostrils daily for 3 days., Disp: 16 g, Rfl: 0 ?  ibuprofen (ADVIL,MOTRIN) 400 MG tablet, Take 1 tablet (400 mg total) by mouth every 8 (eight) hours as needed for moderate pain. (Patient not taking: Reported on 09/30/2018), Disp: 21 tablet, Rfl: 0 ?  Levonorgestrel (LILETTA, 52 MG, IU), 1 application by Intrauterine route once. Since June 2017, Disp: , Rfl:  ?  metoprolol succinate (TOPROL-XL) 25 MG 24 hr tablet, Take 1  tablet by mouth daily., Disp: , Rfl:  ?  Multiple Vitamins-Minerals (HAIR/SKIN/NAILS/BIOTIN) TABS, Take 1 tablet by mouth daily., Disp: , Rfl:  ?  ondansetron (ZOFRAN) 4 MG tablet, Take 1 tablet (4 mg total) by mouth every 6 (six) hours., Disp: 12 tablet, Rfl: 0 ?  predniSONE (DELTASONE) 50 MG tablet, One tablet a day (Patient not taking: Reported on 06/15/2021), Disp: 6 tablet, Rfl: 0  ? ?Allergies  ?Allergen Reactions  ? Dilaudid [Hydromorphone Hcl] Other (See Comments)  ?  Upset stomach  ? Phenergan [Promethazine Hcl] Diarrhea  ? Codeine Rash  ? Penicillins Itching and Rash  ?  Did it involve swelling of the face/tongue/throat, SOB, or low BP? No ?Did it involve sudden or severe rash/hives, skin peeling, or any reaction on the inside of your mouth or nose? Yes ?Did you need to seek medical attention at a hospital or doctor's office? Unknown ?When did it last happen?   baby    ?If all above answers are ?NO?, may proceed with cephalosporin use.  ? Sulfa Antibiotics Rash  ? ? ?Past Medical History:  ?Diagnosis Date  ? Abnormal Pap smear   ? colpo with biopsy at age 45  ? Aneurysm ([place])   ? Bipolar 1 disorder ([place])   ? Hypertension   ? Infertility   ? able to become pregnant with Clomid  ? Low iron   ? PTSD (post-traumatic stress  disorder)   ? seeing neuropsychologist  ?  ? ?Past Surgical History:  ?Procedure Laterality Date  ? NO PAST SURGERIES    ? ? ?Family History  ?Problem Relation Age of Onset  ? Diabetes Mother   ? Hypertension Mother   ? Diabetes Father   ? Hypertension Father   ? Alzheimer's disease Father   ? Other Sister   ?     breast cysts  ? Breast cancer Paternal Aunt   ?     had mastectomy  ? Alzheimer's disease Paternal Aunt   ? ? ?Social History  ? ?Tobacco Use  ? Smoking status: Never  ? Smokeless tobacco: Never  ?Vaping Use  ? Vaping Use: Never used  ?Substance Use Topics  ? Alcohol use: No  ? Drug use: No  ? ? ?ROS ? ? ?Objective:  ? ?Vitals: ?BP 137/87 (BP Location: Left Arm)   Pulse 88   Temp  98.1 ?F (36.7 ?C) (Oral)   Resp 18   SpO2 98%  ? ?Physical Exam ?Constitutional:   ?   General: She is not in acute distress. ?   Appearance: Normal appearance. She is well-developed. She is not ill-appearing, toxic-appearing or diaphoretic.  ?HENT:  ?   Head: Normocephalic and atraumatic.  ?   Nose: Nose normal.  ?   Mouth/Throat:  ?   Mouth: Mucous membranes are moist.  ?Eyes:  ?   General: No scleral icterus.    ?   Right eye: No discharge.     ?   Left eye: No discharge.  ?   Extraocular Movements: Extraocular movements intact.  ?Cardiovascular:  ?   Rate and Rhythm: Normal rate.  ?Pulmonary:  ?   Effort: Pulmonary effort is normal.  ?Musculoskeletal:  ?     Feet: ? ?Skin: ?   General: Skin is warm and dry.  ?Neurological:  ?   General: No focal deficit present.  ?   Mental Status: She is alert and oriented to person, place, and time.  ?Psychiatric:     ?   Mood and Affect: Mood normal.     ?   Behavior: Behavior normal.  ? ? ? ? ? ?Assessment and Plan :  ? ?PDMP not reviewed this encounter. ? ?1. Puncture wound   ? ?I cannot appreciate any firm areas suspicious for retained foreign body.  I deferred imaging as wood is not radiopaque.  Tdap updated today.  Recommended conservative management, wound care reviewed.  She is to wear a postop shoe for symptomatic relief over this next week.  Tylenol for pain relief.  Counseled patient on potential for adverse effects with medications prescribed/recommended today, ER and return-to-clinic precautions discussed, patient verbalized understanding. ? ?  ?Jaynee Eagles, PA-C ?06/15/21 1528 ? ?

## 2021-12-09 ENCOUNTER — Ambulatory Visit (INDEPENDENT_AMBULATORY_CARE_PROVIDER_SITE_OTHER): Payer: BLUE CROSS/BLUE SHIELD

## 2021-12-09 ENCOUNTER — Ambulatory Visit
Admission: EM | Admit: 2021-12-09 | Discharge: 2021-12-09 | Disposition: A | Discharge status: Home / Self Care | Payer: BLUE CROSS/BLUE SHIELD | Attending: Emergency Medicine | Admitting: Emergency Medicine

## 2021-12-09 DIAGNOSIS — Z760 Encounter for issue of repeat prescription: Secondary | ICD-10-CM | POA: Insufficient documentation

## 2021-12-09 DIAGNOSIS — U071 COVID-19: Secondary | ICD-10-CM | POA: Diagnosis present

## 2021-12-09 DIAGNOSIS — Z20822 Contact with and (suspected) exposure to covid-19: Secondary | ICD-10-CM | POA: Diagnosis present

## 2021-12-09 DIAGNOSIS — I10 Essential (primary) hypertension: Secondary | ICD-10-CM | POA: Diagnosis present

## 2021-12-09 DIAGNOSIS — J069 Acute upper respiratory infection, unspecified: Secondary | ICD-10-CM | POA: Diagnosis present

## 2021-12-09 DIAGNOSIS — R059 Cough, unspecified: Secondary | ICD-10-CM | POA: Diagnosis not present

## 2021-12-09 LAB — RESP PANEL BY RT-PCR (FLU A&B, COVID) ARPGX2
Influenza A by PCR: NEGATIVE
Influenza B by PCR: NEGATIVE
SARS Coronavirus 2 by RT PCR: POSITIVE — AB

## 2021-12-09 MED ORDER — HYDROCOD POLI-CHLORPHE POLI ER 10-8 MG/5ML PO SUER: 5.0000 mL | Freq: Two times a day (BID) | ORAL | 0 refills | Status: DC | PRN

## 2021-12-09 MED ORDER — ALBUTEROL SULFATE HFA 108 (90 BASE) MCG/ACT IN AERS: 1.0000 | INHALATION_SPRAY | RESPIRATORY_TRACT | 0 refills | Status: DC | PRN

## 2021-12-09 MED ORDER — PREDNISONE 20 MG PO TABS: 40.0000 mg | ORAL_TABLET | Freq: Every day | ORAL | 0 refills | Status: AC

## 2021-12-09 MED ORDER — AEROCHAMBER MV MISC: 1 refills | Status: DC

## 2021-12-09 MED ORDER — IPRATROPIUM-ALBUTEROL 0.5-2.5 (3) MG/3ML IN SOLN
3.0000 mL | Freq: Once | RESPIRATORY_TRACT | Status: AC
  Administered 2021-12-09: 3 mL via RESPIRATORY_TRACT

## 2021-12-09 MED ORDER — METOPROLOL SUCCINATE ER 50 MG PO TB24: 50.0000 mg | ORAL_TABLET | Freq: Every day | ORAL | 0 refills | Status: AC

## 2021-12-09 NOTE — ED Triage Notes (Signed)
Pt presents to [facility] with co of sore throat, cp, cough, chills. Pt reports symptoms started Sunday. Has been taking sinus and ha, Theraflu for symptoms. Pt has had both Covid shots.

## 2021-12-09 NOTE — Discharge Instructions (Addendum)
Your chest x-ray is negative for pneumonia.  Your COVID and flu will be back in 6 to 24 hours.  I will prescribe the appropriate antivirals if they come back positive.  In the meantime, 2 puffs from your albuterol inhaler using your spacer every 4 hours for 2 days, then every 6 hours for 2 days, then as needed.  Feel the prednisone if your COVID/flu are negative.  Tussionex as needed for cough.  I have also refilled your metoprolol 50 mg

## 2021-12-09 NOTE — ED Provider Notes (Signed)
HPI  SUBJECTIVE:  Vicki Mack is a 46 y.o. female who presents with 2 days of headache, cough, clear nasal congestion/rhinorrhea, feeling hot and cold, sinus pain and pressure, sore throat.  She states that her chest is sore and her sides hurt secondary to the cough she reports shortness of breath at rest and fatigues early with exertion.  She reports diarrhea, which has resolved.  She is unable to sleep at night secondary to the cough.  She has been exposed to [place].  She had 2 doses of the COVID-vaccine.  No known influenza exposure.  She has not yet gotten the influenza vaccine.  No fevers, wheezing, loss of sense of smell or taste, nausea, vomiting.  No antibiotics in the past month.  No antipyretic in the past 6 hours.  She tried Tylenol sinus headache and TheraFlu without improvement in her symptoms.  Symptoms are worse with exertion.  She has a past medical history of hypertension, ran out of her metoprolol 1 to 2 weeks ago, aneurysm, pneumonia.  No history of diabetes, pulmonary disease, COVID.  LMP: 9/28.  PCP: [facility] primary care.  She has an appointment with them on Friday.    Past Medical History:  Diagnosis Date   Abnormal Pap smear    colpo with biopsy at age 68   Aneurysm ([place])    Bipolar 1 disorder ([place])    Hypertension    Infertility    able to become pregnant with Clomid   Low iron    PTSD (post-traumatic stress disorder)    seeing neuropsychologist    Past Surgical History:  Procedure Laterality Date   NO PAST SURGERIES      Family History  Problem Relation Age of Onset   Diabetes Mother    Hypertension Mother    Diabetes Father    Hypertension Father    Alzheimer's disease Father    Other Sister        breast cysts   Breast cancer Paternal Aunt        had mastectomy   Alzheimer's disease Paternal Aunt     Social History   Tobacco Use   Smoking status: Never   Smokeless tobacco: Never  Vaping Use   Vaping Use: Never used  Substance Use  Topics   Alcohol use: No   Drug use: No    No current facility-administered medications for this encounter.  Current Outpatient Medications:    albuterol (VENTOLIN HFA) 108 (90 Base) MCG/ACT inhaler, Inhale 1-2 puffs into the lungs every 4 (four) hours as needed for wheezing or shortness of breath., Disp: 1 each, Rfl: 0   chlorpheniramine-HYDROcodone (TUSSIONEX) 10-8 MG/5ML, Take 5 mLs by mouth every 12 (twelve) hours as needed for cough., Disp: 60 mL, Rfl: 0   metoprolol succinate (TOPROL XL) 50 MG 24 hr tablet, Take 1 tablet (50 mg total) by mouth daily. Take with or immediately following a meal., Disp: 30 tablet, Rfl: 0   predniSONE (DELTASONE) 20 MG tablet, Take 2 tablets (40 mg total) by mouth daily with breakfast for 5 days., Disp: 10 tablet, Rfl: 0   Spacer/Aero-Holding Chambers (AEROCHAMBER MV) inhaler, Use as instructed, Disp: 1 each, Rfl: 1   acetaminophen (TYLENOL) 325 MG tablet, Take 2 tablets (650 mg total) by mouth every 6 (six) hours as needed for moderate pain., Disp: 30 tablet, Rfl: 0   baclofen (LIORESAL) 10 MG tablet, Take 10 mg by mouth 3 (three) times daily., Disp: , Rfl:    diphenhydramine-acetaminophen (TYLENOL  PM) 25-500 MG TABS tablet, Take 1 tablet by mouth at bedtime as needed (sleep)., Disp: , Rfl:    DULoxetine (CYMBALTA) 60 MG capsule, Take 60 mg by mouth daily., Disp: , Rfl:    fluticasone (FLONASE) 50 MCG/ACT nasal spray, Place 1 spray into both nostrils daily for 3 days., Disp: 16 g, Rfl: 0   Levonorgestrel (LILETTA, 52 MG, IU), 1 application by Intrauterine route once. Since June 2017, Disp: , Rfl:    Multiple Vitamins-Minerals (HAIR/SKIN/NAILS/BIOTIN) TABS, Take 1 tablet by mouth daily., Disp: , Rfl:    ondansetron (ZOFRAN) 4 MG tablet, Take 1 tablet (4 mg total) by mouth every 6 (six) hours., Disp: 12 tablet, Rfl: 0  Allergies  Allergen Reactions   Dilaudid [Hydromorphone Hcl] Other (See Comments)    Upset stomach   Phenergan [Promethazine Hcl] Diarrhea    Codeine Rash   Penicillins Itching and Rash    Did it involve swelling of the face/tongue/throat, SOB, or low BP? No Did it involve sudden or severe rash/hives, skin peeling, or any reaction on the inside of your mouth or nose? Yes Did you need to seek medical attention at a hospital or doctor's office? Unknown When did it last happen?   baby    If all above answers are "NO", may proceed with cephalosporin use.   Sulfa Antibiotics Rash     ROS  As noted in HPI.   Physical Exam  BP 128/89   Pulse (!) 120 Comment: pt reports out of metoprolol  Temp 98.9 F (37.2 C) (Oral)   Resp 18   LMP 11/13/2021   SpO2 98%   Constitutional: Well developed, well nourished, no acute distress Eyes:  EOMI, conjunctiva normal bilaterally HENT: Normocephalic, atraumatic,mucus membranes moist.  Positive nasal congestion.  Erythematous, swollen turbinates.  Positive maxillary sinus tenderness.  Slightly erythematous oropharynx.  Tonsils normal size without exudates.  Positive cobblestoning. Neck: Positive cervical adenopathy Respiratory: Normal inspiratory effort, lungs clear bilaterally, positive anterior lateral chest wall tenderness. Cardiovascular: Regular tachycardia, no murmurs, rubs, gallops GI: nondistended, soft, diffuse abdominal tenderness.  No guarding, rebound. skin: No rash, skin intact Musculoskeletal: no deformities Neurologic: Alert & oriented x 3, no focal neuro deficits Psychiatric: Speech and behavior appropriate   ED Course   Medications  ipratropium-albuterol (DUONEB) 0.5-2.5 (3) MG/3ML nebulizer solution 3 mL (3 mLs Nebulization Given 12/09/21 1754)    Orders Placed This Encounter  Procedures   Resp Panel by RT-PCR (Flu A&B, Covid) Anterior Nasal Swab    Standing Status:   Standing    Number of Occurrences:   1   DG Chest 2 View    Standing Status:   Standing    Number of Occurrences:   1    Order Specific Question:   Reason for Exam (SYMPTOM  OR DIAGNOSIS  REQUIRED)    Answer:   cough r/o pNA    Results for orders placed or performed during the hospital encounter of 12/09/21 (from the past 24 hour(s))  Resp Panel by RT-PCR (Flu A&B, Covid) Anterior Nasal Swab     Status: Abnormal   Collection Time: 12/09/21  5:54 PM   Specimen: Anterior Nasal Swab  Result Value Ref Range   SARS Coronavirus 2 by RT PCR POSITIVE (A) NEGATIVE   Influenza A by PCR NEGATIVE NEGATIVE   Influenza B by PCR NEGATIVE NEGATIVE   DG Chest 2 View  Result Date: 12/09/2021 CLINICAL DATA:  Cough rule out pneumonia EXAM: CHEST - 2 VIEW COMPARISON:  Chest  02/11/2021 FINDINGS: The heart size and mediastinal contours are within normal limits. Both lungs are clear. The visualized skeletal structures are unremarkable. IMPRESSION: No active cardiopulmonary disease. Electronically Signed   By: Franchot Gallo M.D.   On: 12/09/2021 17:44    ED Clinical Impression  1. COVID-19 virus infection   2. Encounter for laboratory testing for COVID-19 virus   3. Viral upper respiratory tract infection with cough   4. Medication refill   5. Essential hypertension      ED Assessment/Plan      Patient with an acute illness with systemic symptoms of tachycardia.  Outside records reviewed.  Medication dosage clarified.  [facility] narcotic database reviewed.  Last opiate prescription was in February of this year.  1.   cough/upper respiratory infection.  Will give patient DuoNeb.  Getting chest x-ray, checking COVID and flu.  Reviewed imaging independently.  No pneumonia.  Normal chest x-ray.  See radiology report for full details.  Chest x-ray negative for pneumonia.  Concern for COVID.  However, we have time to wait for test results before initiating antivirals.  She qualifies for antivirals based on history of hypertension.  Home with regularly scheduled albuterol inhaler with a spacer 2 puffs every 4 hours for 2 days, 2 puffs every 6 hours for 2 days,  then as needed,  Tussionex 5 mL 3 times daily as patient has an allergy to promethazine.  Tylenol 1000 mg/ibuprofen 600 mg 3 times daily as needed pain.  Wait-and-see prescription of prednisone if COVID, influenza is negative.  On reevaluation, patient states that she feels better.  Improved air movement on reexamination.  Still no wheezing.  COVID positive.  GFR from outside labs done in February 2023 >90.  We will have staff contact patient, discuss both molnupiravir and Paxlovid, and prescribe 1 of those.  No prednisone.  Continue albuterol, Tussionex, Tylenol/ibuprofen.  2.  Medication refill for hypertension.  Patient normotensive, however, given history of aneurysm, will refill the medication.  Per outside chart review, it is Toprol XL 50 mg 1 daily.  She has follow-up arranged with her PCP on Friday.  Discussed labs, imaging, MDM, treatment plan, and plan for follow-up with patient. Discussed sn/sx that should prompt return to the ED. patient agrees with plan.   Meds ordered this encounter  Medications   ipratropium-albuterol (DUONEB) 0.5-2.5 (3) MG/3ML nebulizer solution 3 mL   albuterol (VENTOLIN HFA) 108 (90 Base) MCG/ACT inhaler    Sig: Inhale 1-2 puffs into the lungs every 4 (four) hours as needed for wheezing or shortness of breath.    Dispense:  1 each    Refill:  0   Spacer/Aero-Holding Chambers (AEROCHAMBER MV) inhaler    Sig: Use as instructed    Dispense:  1 each    Refill:  1   chlorpheniramine-HYDROcodone (TUSSIONEX) 10-8 MG/5ML    Sig: Take 5 mLs by mouth every 12 (twelve) hours as needed for cough.    Dispense:  60 mL    Refill:  0   predniSONE (DELTASONE) 20 MG tablet    Sig: Take 2 tablets (40 mg total) by mouth daily with breakfast for 5 days.    Dispense:  10 tablet    Refill:  0   metoprolol succinate (TOPROL XL) 50 MG 24 hr tablet    Sig: Take 1 tablet (50 mg total) by mouth daily. Take with or immediately following a meal.    Dispense:  30 tablet    Refill:  0       *  This clinic note was created using [employer]. Therefore, there may be occasional mistakes despite careful proofreading.  ?    Melynda Ripple, MD 12/10/21 [id number]

## 2021-12-10 ENCOUNTER — Telehealth: Payer: Self-pay | Admitting: Emergency Medicine

## 2021-12-10 MED ORDER — NIRMATRELVIR/RITONAVIR (PAXLOVID)TABLET: 3.0000 | ORAL_TABLET | Freq: Two times a day (BID) | ORAL | 0 refills | Status: AC

## 2022-05-15 ENCOUNTER — Ambulatory Visit (INDEPENDENT_AMBULATORY_CARE_PROVIDER_SITE_OTHER): Payer: BLUE CROSS/BLUE SHIELD

## 2022-05-15 ENCOUNTER — Ambulatory Visit
Admission: EM | Admit: 2022-05-15 | Discharge: 2022-05-15 | Disposition: A | Discharge status: Home / Self Care | Payer: BLUE CROSS/BLUE SHIELD | Attending: Family Medicine | Admitting: Family Medicine

## 2022-05-15 DIAGNOSIS — J4521 Mild intermittent asthma with (acute) exacerbation: Secondary | ICD-10-CM

## 2022-05-15 DIAGNOSIS — R059 Cough, unspecified: Secondary | ICD-10-CM | POA: Diagnosis not present

## 2022-05-15 DIAGNOSIS — J069 Acute upper respiratory infection, unspecified: Secondary | ICD-10-CM | POA: Diagnosis not present

## 2022-05-15 MED ORDER — BENZONATATE 100 MG PO CAPS: 100.0000 mg | ORAL_CAPSULE | Freq: Three times a day (TID) | ORAL | 0 refills | Status: DC | PRN

## 2022-05-15 MED ORDER — PREDNISONE 20 MG PO TABS: 40.0000 mg | ORAL_TABLET | Freq: Every day | ORAL | 0 refills | Status: AC

## 2022-05-15 MED ORDER — ALBUTEROL SULFATE HFA 108 (90 BASE) MCG/ACT IN AERS: 2.0000 | INHALATION_SPRAY | RESPIRATORY_TRACT | 0 refills | Status: AC | PRN

## 2022-05-15 NOTE — ED Triage Notes (Signed)
Pt reports cough x 5 days. Used Sudafed, Nyquil, Theraflu. Reports tightness when breathing and hard to catch breath at times.  No fevers, no HA, no n/v/d.  When she felt like this before, she had pneumonia.

## 2022-05-15 NOTE — ED Provider Notes (Signed)
[facility]    CSN: [id number] Arrival date & time: 05/15/22  1539      History   Chief Complaint Chief Complaint  Patient presents with   Cough    HPI Vicki Mack is a 47 y.o. female.    Cough  Here for cough and congestion.  She is also had tightness in her chest and wheezing.  Symptoms began on March 24.  No fever so far.  No vomiting or diarrhea.  She has heard rattling in her chest.  She also feels some pain in her left lower chest and feels like there is a knot in her chest  She denies a history of asthma, but has been treated with an inhaler for bronchitis  She states this is like when she had pneumonia recently.  On review of the chart there is a episode in care everywhere in January where she was diagnosed with pneumonia.  I cannot see the result of that x-ray.  Past Medical History:  Diagnosis Date   Abnormal Pap smear    colpo with biopsy at age 32   Aneurysm [facility])    Bipolar 1 disorder ([place])    Hypertension    Infertility    able to become pregnant with Clomid   Low iron    PTSD (post-traumatic stress disorder)    seeing neuropsychologist    Patient Active Problem List   Diagnosis Date Noted   Intracranial aneurysm 10/12/2018   Viral URI with cough 05/03/2018   Migraine headache without aura 08/27/2011   SAB (spontaneous abortion) 08/27/2011    Past Surgical History:  Procedure Laterality Date   CHOLECYSTECTOMY  2021   NO PAST SURGERIES      OB History     Gravida  3   Para  1   Term  1   Preterm      AB  2   Living  1      SAB  2   IAB      Ectopic      Multiple      Live Births  1            Home Medications    Prior to Admission medications   Medication Sig Start Date End Date Taking? Authorizing Provider  acetaminophen (TYLENOL) 325 MG tablet Take 2 tablets (650 mg total) by mouth every 6 (six) hours as needed for moderate pain. 06/15/21  Yes Jaynee Eagles, PA-C  albuterol (VENTOLIN HFA)  108 (90 Base) MCG/ACT inhaler Inhale 2 puffs into the lungs every 4 (four) hours as needed for wheezing or shortness of breath. 05/15/22  Yes Jessamy Torosyan, Gwenlyn Perking, MD  benzonatate (TESSALON) 100 MG capsule Take 1 capsule (100 mg total) by mouth 3 (three) times daily as needed for cough. 05/15/22  Yes Barrett Henle, MD  Ibuprofen-diphenhydrAMINE Cit (ADVIL PM PO) Take by mouth.   Yes [provider]  Levonorgestrel (LILETTA, 52 MG, IU) 1 application by Intrauterine route once. Since June 2017   Yes [provider]  metoprolol succinate (TOPROL XL) 50 MG 24 hr tablet Take 1 tablet (50 mg total) by mouth daily. Take with or immediately following a meal. 12/09/21  Yes Melynda Ripple, MD  predniSONE (DELTASONE) 20 MG tablet Take 2 tablets (40 mg total) by mouth daily with breakfast for 5 days. 05/15/22 05/20/22 Yes Barrett Henle, MD    Family History Family History  Problem Relation Age of Onset   Diabetes Mother  Hypertension Mother    Diabetes Father    Hypertension Father    Alzheimer's disease Father    Other Sister        breast cysts   Breast cancer Paternal Aunt        had mastectomy   Alzheimer's disease Paternal Aunt     Social History Social History   Tobacco Use   Smoking status: Never   Smokeless tobacco: Never  Vaping Use   Vaping Use: Never used  Substance Use Topics   Alcohol use: No   Drug use: No     Allergies   Dilaudid [hydromorphone hcl], Phenergan [promethazine hcl], Codeine, Penicillins, and Sulfa antibiotics   Review of Systems Review of Systems  Respiratory:  Positive for cough.      Physical Exam Triage Vital Signs ED Triage Vitals  Enc Vitals Group     BP 05/15/22 1636 128/88     Pulse Rate 05/15/22 1636 69     Resp 05/15/22 1636 18     Temp 05/15/22 1636 98.6 F (37 C)     Temp Source 05/15/22 1636 Oral     SpO2 05/15/22 1636 98 %     Weight --      Height --      Head Circumference --      Peak Flow --       Pain Score 05/15/22 1632 5     Pain Loc --      Pain Edu? --      Excl. in [place]? --    No data found.  Updated Vital Signs BP 128/88 (BP Location: Left Arm)   Pulse 69   Temp 98.6 F (37 C) (Oral)   Resp 18   SpO2 98%   Visual Acuity Right Eye Distance:   Left Eye Distance:   Bilateral Distance:    Right Eye Near:   Left Eye Near:    Bilateral Near:     Physical Exam Vitals reviewed.  Constitutional:      General: She is not in acute distress.    Appearance: She is not toxic-appearing.  HENT:     Right Ear: Tympanic membrane and ear canal normal.     Left Ear: Tympanic membrane and ear canal normal.     Nose: Congestion present.     Mouth/Throat:     Mouth: Mucous membranes are moist.     Pharynx: No oropharyngeal exudate or posterior oropharyngeal erythema.  Eyes:     Extraocular Movements: Extraocular movements intact.     Conjunctiva/sclera: Conjunctivae normal.     Pupils: Pupils are equal, round, and reactive to light.  Cardiovascular:     Rate and Rhythm: Normal rate and regular rhythm.     Heart sounds: No murmur heard. Pulmonary:     Effort: Pulmonary effort is normal. No respiratory distress.     Breath sounds: No stridor. No rhonchi or rales.     Comments: Expiratory low pitched wheezes are heard bilaterally. Chest:     Chest wall: No tenderness.  Musculoskeletal:     Cervical back: Neck supple.  Lymphadenopathy:     Cervical: No cervical adenopathy.  Skin:    Capillary Refill: Capillary refill takes less than 2 seconds.     Coloration: Skin is not jaundiced or pale.  Neurological:     General: No focal deficit present.     Mental Status: She is alert and oriented to person, place, and time.  Psychiatric:  Behavior: Behavior normal.      UC Treatments / Results  Labs (all labs ordered are listed, but only abnormal results are displayed) Labs Reviewed - No data to display  EKG   Radiology DG Chest 2 View  Result Date:  05/15/2022 CLINICAL DATA:  Cough EXAM: CHEST - 2 VIEW COMPARISON:  12/09/2021 FINDINGS: Cardiac and mediastinal contours are within normal limits. No focal pulmonary opacity. No pleural effusion or pneumothorax. No acute osseous abnormality. IMPRESSION: No acute cardiopulmonary process. Electronically Signed   By: Merilyn Baba M.D.   On: 05/15/2022 17:16    Procedures Procedures (including critical care time)  Medications Ordered in UC Medications - No data to display  Initial Impression / Assessment and Plan / UC Course  I have reviewed the triage vital signs and the nursing notes.  Pertinent labs & imaging results that were available during my care of the patient were reviewed by me and considered in my medical decision making (see chart for details).        Chest x-ray is clear.  Prednisone is sent in for probable asthma exacerbation albuterol sent in for the wheezing.  Tessalon Perles are sent in for the cough.  I am not going to do any viral testing, as this is day 5. Final Clinical Impressions(s) / UC Diagnoses   Final diagnoses:  Viral URI  Mild intermittent asthma with (acute) exacerbation     Discharge Instructions      Your chest x-ray is clear  Albuterol inhaler--do 2 puffs every 4 hours as needed for shortness of breath or wheezing  Take prednisone 20 mg--2 daily for 5 days  Take benzonatate 100 mg, 1 tab every 8 hours as needed for cough.         ED Prescriptions     Medication Sig Dispense Auth. Provider   albuterol (VENTOLIN HFA) 108 (90 Base) MCG/ACT inhaler Inhale 2 puffs into the lungs every 4 (four) hours as needed for wheezing or shortness of breath. 1 each Barrett Henle, MD   predniSONE (DELTASONE) 20 MG tablet Take 2 tablets (40 mg total) by mouth daily with breakfast for 5 days. 10 tablet Barrett Henle, MD   benzonatate (TESSALON) 100 MG capsule Take 1 capsule (100 mg total) by mouth 3 (three) times daily as needed for cough. 21  capsule Barrett Henle, MD      PDMP not reviewed this encounter.   Barrett Henle, MD 05/15/22 [phone]

## 2022-05-15 NOTE — Discharge Instructions (Signed)
Your chest x-ray is clear  Albuterol inhaler--do 2 puffs every 4 hours as needed for shortness of breath or wheezing  Take prednisone 20 mg--2 daily for 5 days  Take benzonatate 100 mg, 1 tab every 8 hours as needed for cough.

## 2022-07-09 ENCOUNTER — Emergency Department (HOSPITAL_COMMUNITY)
Admission: EM | Admit: 2022-07-09 | Discharge: 2022-07-09 | Disposition: A | Discharge status: Home / Self Care | Payer: BLUE CROSS/BLUE SHIELD | Attending: Emergency Medicine | Admitting: Emergency Medicine

## 2022-07-09 ENCOUNTER — Emergency Department (HOSPITAL_COMMUNITY): Payer: BLUE CROSS/BLUE SHIELD

## 2022-07-09 DIAGNOSIS — Z79899 Other long term (current) drug therapy: Secondary | ICD-10-CM | POA: Diagnosis not present

## 2022-07-09 DIAGNOSIS — R202 Paresthesia of skin: Secondary | ICD-10-CM | POA: Diagnosis not present

## 2022-07-09 DIAGNOSIS — G43409 Hemiplegic migraine, not intractable, without status migrainosus: Secondary | ICD-10-CM | POA: Diagnosis not present

## 2022-07-09 DIAGNOSIS — H538 Other visual disturbances: Secondary | ICD-10-CM | POA: Insufficient documentation

## 2022-07-09 DIAGNOSIS — I10 Essential (primary) hypertension: Secondary | ICD-10-CM | POA: Diagnosis not present

## 2022-07-09 DIAGNOSIS — R531 Weakness: Secondary | ICD-10-CM | POA: Diagnosis not present

## 2022-07-09 DIAGNOSIS — R200 Anesthesia of skin: Secondary | ICD-10-CM

## 2022-07-09 LAB — CBC
HCT: 42.5 % (ref 36.0–46.0)
Hemoglobin: 13.9 g/dL (ref 12.0–15.0)
MCH: 29.1 pg (ref 26.0–34.0)
MCHC: 32.7 g/dL (ref 30.0–36.0)
MCV: 89.1 fL (ref 80.0–100.0)
Platelets: 240 10*3/uL (ref 150–400)
RBC: 4.77 MIL/uL (ref 3.87–5.11)
RDW: 14.6 % (ref 11.5–15.5)
WBC: 8.7 10*3/uL (ref 4.0–10.5)
nRBC: 0 % (ref 0.0–0.2)

## 2022-07-09 LAB — DIFFERENTIAL
Abs Immature Granulocytes: 0.03 10*3/uL (ref 0.00–0.07)
Basophils Absolute: 0.1 10*3/uL (ref 0.0–0.1)
Basophils Relative: 1 %
Eosinophils Absolute: 0.3 10*3/uL (ref 0.0–0.5)
Eosinophils Relative: 4 %
Immature Granulocytes: 0 %
Lymphocytes Relative: 33 %
Lymphs Abs: 2.9 10*3/uL (ref 0.7–4.0)
Monocytes Absolute: 0.8 10*3/uL (ref 0.1–1.0)
Monocytes Relative: 9 %
Neutro Abs: 4.6 10*3/uL (ref 1.7–7.7)
Neutrophils Relative %: 53 %

## 2022-07-09 LAB — I-STAT CHEM 8, ED
BUN: 5 mg/dL — ABNORMAL LOW (ref 6–20)
Calcium, Ion: 1.17 mmol/L (ref 1.15–1.40)
Chloride: 103 mmol/L (ref 98–111)
Creatinine, Ser: 1 mg/dL (ref 0.44–1.00)
Glucose, Bld: 135 mg/dL — ABNORMAL HIGH (ref 70–99)
HCT: 44 % (ref 36.0–46.0)
Hemoglobin: 15 g/dL (ref 12.0–15.0)
Potassium: 3.3 mmol/L — ABNORMAL LOW (ref 3.5–5.1)
Sodium: 138 mmol/L (ref 135–145)
TCO2: 26 mmol/L (ref 22–32)

## 2022-07-09 LAB — COMPREHENSIVE METABOLIC PANEL
ALT: 16 U/L (ref 0–44)
AST: 24 U/L (ref 15–41)
Albumin: 3.7 g/dL (ref 3.5–5.0)
Alkaline Phosphatase: 48 U/L (ref 38–126)
Anion gap: 9 (ref 5–15)
BUN: 7 mg/dL (ref 6–20)
CO2: 22 mmol/L (ref 22–32)
Calcium: 8.6 mg/dL — ABNORMAL LOW (ref 8.9–10.3)
Chloride: 104 mmol/L (ref 98–111)
Creatinine, Ser: 1.06 mg/dL — ABNORMAL HIGH (ref 0.44–1.00)
GFR, Estimated: >60 mL/min (ref >60)
Glucose, Bld: 135 mg/dL — ABNORMAL HIGH (ref 70–99)
Potassium: 3.4 mmol/L — ABNORMAL LOW (ref 3.5–5.1)
Sodium: 135 mmol/L (ref 135–145)
Total Bilirubin: 0.7 mg/dL (ref 0.3–1.2)
Total Protein: 6.3 g/dL — ABNORMAL LOW (ref 6.5–8.1)

## 2022-07-09 LAB — ETHANOL: Alcohol, Ethyl (B): <10 mg/dL (ref <10)

## 2022-07-09 LAB — PROTIME-INR
INR: 1.1 (ref 0.8–1.2)
Prothrombin Time: 14.5 seconds (ref 11.4–15.2)

## 2022-07-09 LAB — I-STAT BETA HCG BLOOD, ED (MC, WL, AP ONLY): I-stat hCG, quantitative: <5 m[IU]/mL (ref <5)

## 2022-07-09 LAB — APTT: aPTT: 25 seconds (ref 24–36)

## 2022-07-09 LAB — CBG MONITORING, ED: Glucose-Capillary: 136 mg/dL — ABNORMAL HIGH (ref 70–99)

## 2022-07-09 MED ORDER — ACETAMINOPHEN 500 MG PO TABS
1000.0000 mg | ORAL_TABLET | Freq: Once | ORAL | Status: AC
  Administered 2022-07-09: 1000 mg via ORAL
  Filled 2022-07-09: qty 2

## 2022-07-09 NOTE — ED Notes (Addendum)
Pt currently in MRI with this EN

## 2022-07-09 NOTE — ED Notes (Signed)
CAREL[facility] CALLED TO ACTIVATE STROKE

## 2022-07-09 NOTE — ED Provider Triage Note (Signed)
Emergency Medicine Provider Triage Evaluation Note  Vicki Mack , a 47 y.o. female  was evaluated in triage.  Pt complains of LUE numbness, blurred vision in left eye, and droop of L brow since 6AM today.  Review of Systems  Positive: Weakness, blurred vision Negative: Change in speech  Physical Exam  There were no vitals taken for this visit. Gen:   Awake, no distress   Resp:  Normal effort  MSK:   Moves extremities without difficulty  Other:  +L brow droop, reduced sensation of LUE  Medical Decision Making  Medically screening exam initiated at 7:30 PM.  Appropriate orders placed.  Vicki Mack was informed that the remainder of the evaluation will be completed by another provider, this initial triage assessment does not replace that evaluation, and the importance of remaining in the ED until their evaluation is complete.  Called [facility]. RN Vicki Mack, [place] 07/09/22 [phone]

## 2022-07-09 NOTE — Discharge Instructions (Signed)
You were seen today for numbness and vision changes.  You were evaluated by neurology and had an MRI.  We do not see any evidence of a stroke.  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  You should call the number above to schedule follow-up with your primary care physician as well as a neurologist.  If you develop worsening headache, worsening weakness or numbness or vision changes you should return to the ED.

## 2022-07-09 NOTE — ED Provider Notes (Signed)
[place] EMERGENCY DEPARTMENT AT [facility] Provider Note   CSN: [id number] Arrival date & time: 07/09/22  1928  An emergency department physician performed an initial assessment on this suspected stroke patient at 69.  History  Chief Complaint  Patient presents with   Code Stroke    Vicki Mack is a 47 y.o. female.  HPI 47 year old female history of bipolar 1, hypertension, migraines presenting for weakness and vision changes.  Patient states around 6 PM she was lying down at home.  She developed vision changes to her left eye as well as left facial numbness and left arm numbness.  She had similar symptoms like this once before couple years ago.  She was seen in the ED and had MRI which was unremarkable was discharged home.  She does have mild generalized headache, somewhat is similar to prior migraines.  She has not had any eye pain or head trauma.  No fevers or chills or neck stiffness.  She takes Tylenol for migraines, no other medications for this.     Home Medications Prior to Admission medications   Medication Sig Start Date End Date Taking? Authorizing Provider  acetaminophen (TYLENOL) 325 MG tablet Take 2 tablets (650 mg total) by mouth every 6 (six) hours as needed for moderate pain. 06/15/21   Wallis Bamberg, PA-C  albuterol (VENTOLIN HFA) 108 (90 Base) MCG/ACT inhaler Inhale 2 puffs into the lungs every 4 (four) hours as needed for wheezing or shortness of breath. 05/15/22   Banister, Janace Aris, MD  benzonatate (TESSALON) 100 MG capsule Take 1 capsule (100 mg total) by mouth 3 (three) times daily as needed for cough. 05/15/22   Zenia Resides, MD  Ibuprofen-diphenhydrAMINE Cit (ADVIL PM PO) Take by mouth.    [provider]  Levonorgestrel (LILETTA, 52 MG, IU) 1 application by Intrauterine route once. Since June 2017    [provider]  metoprolol succinate (TOPROL XL) 50 MG 24 hr tablet Take 1 tablet (50 mg total) by mouth daily. Take  with or immediately following a meal. 12/09/21   Domenick Gong, MD      Allergies    Dilaudid [hydromorphone hcl], Phenergan [promethazine hcl], Codeine, Penicillins, and Sulfa antibiotics    Review of Systems   Review of Systems  Eyes:  Positive for visual disturbance.  Neurological:  Positive for numbness and headaches.  All other systems reviewed and are negative.   Physical Exam Updated Vital Signs BP (!) 130/90   Pulse 87   Temp 98.4 F (36.9 C) (Oral)   Resp 17   SpO2 100%  Physical Exam Vitals and nursing note reviewed.  Constitutional:      General: She is not in acute distress.    Appearance: She is well-developed.  HENT:     Head: Normocephalic and atraumatic.     Nose: Nose normal.     Mouth/Throat:     Mouth: Mucous membranes are moist.     Pharynx: Oropharynx is clear.  Eyes:     Extraocular Movements: Extraocular movements intact.     Conjunctiva/sclera: Conjunctivae normal.     Pupils: Pupils are equal, round, and reactive to light.  Cardiovascular:     Rate and Rhythm: Normal rate and regular rhythm.     Heart sounds: No murmur heard. Pulmonary:     Effort: Pulmonary effort is normal. No respiratory distress.     Breath sounds: Normal breath sounds.  Abdominal:     Palpations: Abdomen is soft.  Tenderness: There is no abdominal tenderness. There is no guarding or rebound.  Musculoskeletal:        General: No swelling.     Cervical back: Normal range of motion and neck supple. No rigidity or tenderness.  Skin:    General: Skin is warm and dry.     Capillary Refill: Capillary refill takes less than 2 seconds.  Neurological:     General: No focal deficit present.     Mental Status: She is alert and oriented to person, place, and time. Mental status is at baseline.     Cranial Nerves: No cranial nerve deficit.     Sensory: No sensory deficit.     Motor: No weakness.     Coordination: Coordination normal.     Gait: Gait normal.     Deep  Tendon Reflexes: Reflexes normal.     Comments: Patient wake alert.  No cranial nerve deficits.  Full strength in all extremities.  She has some subjective numbness over the lateral left arm.  No tenderness or skin changes.  She has subjectively decreased sensation over the left face.  No motor deficits.  She has normal sensation and bilateral lower and right upper extremity.  She has no visual field deficits or cuts.  Normal finger-to-nose bilaterally.  Psychiatric:        Mood and Affect: Mood normal.     ED Results / Procedures / Treatments   Labs (all labs ordered are listed, but only abnormal results are displayed) Labs Reviewed  COMPREHENSIVE METABOLIC PANEL - Abnormal; Notable for the following components:      Result Value   Potassium 3.4 (*)    Glucose, Bld 135 (*)    Creatinine, Ser 1.06 (*)    Calcium 8.6 (*)    Total Protein 6.3 (*)    All other components within normal limits  I-STAT CHEM 8, ED - Abnormal; Notable for the following components:   Potassium 3.3 (*)    BUN 5 (*)    Glucose, Bld 135 (*)    All other components within normal limits  CBG MONITORING, ED - Abnormal; Notable for the following components:   Glucose-Capillary 136 (*)    All other components within normal limits  ETHANOL  PROTIME-INR  APTT  CBC  DIFFERENTIAL  RAPID URINE DRUG SCREEN, HOSP PERFORMED  URINALYSIS, ROUTINE W REFLEX MICROSCOPIC  I-STAT BETA HCG BLOOD, ED ([facility], [facility], [facility] ONLY)    EKG EKG Interpretation  Date/Time:  Thursday Jul 09 2022 20:05:51 EDT Ventricular Rate:  83 PR Interval:  152 QRS Duration: 93 QT Interval:  359 QTC Calculation: 422 R Axis:   61 Text Interpretation: Sinus rhythm Low voltage, precordial leads Borderline T abnormalities, anterior leads No significant change since last tracing Confirmed by Elayne Snare ([id number]) on 07/09/2022 8:57:27 PM  Radiology MR BRAIN WO CONTRAST  Result Date: 07/09/2022 CLINICAL DATA:  Acute neurologic deficit EXAM: MRI HEAD  WITHOUT CONTRAST TECHNIQUE: Multiplanar, multiecho pulse sequences of the brain and surrounding structures were obtained without intravenous contrast. COMPARISON:  11/27/2020 FINDINGS: Brain: No acute infarct, mass effect or extra-axial collection. No acute or chronic hemorrhage. Normal white matter signal, parenchymal volume and CSF spaces. The midline structures are normal. Vascular: Major flow voids are preserved. Skull and upper cervical spine: Normal calvarium and skull base. Visualized upper cervical spine and soft tissues are normal. Sinuses/Orbits:No paranasal sinus fluid levels or advanced mucosal thickening. No mastoid or middle ear effusion. Normal orbits. IMPRESSION: Normal brain MRI. Electronically  Signed   By: Deatra Robinson M.D.   On: 07/09/2022 21:54   CT HEAD CODE STROKE WO CONTRAST  Result Date: 07/09/2022 CLINICAL DATA:  Code stroke.  Left eye droop EXAM: CT HEAD WITHOUT CONTRAST TECHNIQUE: Contiguous axial images were obtained from the base of the skull through the vertex without intravenous contrast. RADIATION DOSE REDUCTION: This exam was performed according to the departmental dose-optimization program which includes automated exposure control, adjustment of the mA and/or kV according to patient size and/or use of iterative reconstruction technique. COMPARISON:  None Available. FINDINGS: Brain: There is no mass, hemorrhage or extra-axial collection. The size and configuration of the ventricles and extra-axial CSF spaces are normal. The brain parenchyma is normal, without evidence of acute or chronic infarction. Vascular: No abnormal hyperdensity of the major intracranial arteries or dural venous sinuses. No intracranial atherosclerosis. Skull: The visualized skull base, calvarium and extracranial soft tissues are normal. Sinuses/Orbits: No fluid levels or advanced mucosal thickening of the visualized paranasal sinuses. No mastoid or middle ear effusion. The orbits are normal. ASPECTS  [facility] Stroke Program Early CT Score) - Ganglionic level infarction (caudate, lentiform nuclei, internal capsule, insula, M1-M3 cortex): 7 - Supraganglionic infarction (M4-M6 cortex): 3 Total score (0-10 with 10 being normal): 10 IMPRESSION: 1. No acute intracranial abnormality. 2. ASPECTS is 10. These results were communicated to Dr. Erick Blinks at 7:44 pm on 07/09/2022 by text page via the [facility] messaging system. Electronically Signed   By: Deatra Robinson M.D.   On: 07/09/2022 19:45    Procedures Procedures    Medications Ordered in ED Medications  acetaminophen (TYLENOL) tablet 1,000 mg (1,000 mg Oral Given 07/09/22 2235)    ED Course/ Medical Decision Making/ A&P                             Medical Decision Making Risk OTC drugs.   47 year old female presenting for vision changes, left-sided numbness.  Vital signs reviewed notable for hypertension.  On exam she is well-appearing.  She has some subjective decrease sensation over the left face and arm but no objective deficits.  She was evaluated as a code stroke with neurology at bedside.  Differential including stroke, TIA, atypical migraine.  She does have history of migraines and had similar episode of this prior.  [facility] imaging reviewed, no signs of LVO, acute hemorrhage or obvious ischemic stroke.  Lab work reviewed notable for K of 3.4.  CBC is unremarkable.  She is no signs of infection.  Neurology evaluated, they have low suspicion for stroke.  They recommend MRI, if normal recommend outpatient follow-up.  Neurology suspects complex migraine.  MRI reviewed, no signs of stroke or other acute abnormality.  On reevaluation symptoms are stable.  She was given Tylenol.  I offered her additional medications for migraine, she prefers to go home and go to sleep.  I think this is reasonable given reassuring MRI, similar episode prior which resolved on its own.  I gave very strict return precautions for any worsening symptoms or new  symptoms.  I recommend she call neurology and PCP tomorrow to schedule close follow-up.  Discharged with strict return precautions.        Final Clinical Impression(s) / ED Diagnoses Final diagnoses:  Numbness    Rx / DC Orders ED Discharge Orders     None         Fulton Reek, MD 07/09/22 2324    Rexford Maus,  DO 07/10/22 1505

## 2022-07-09 NOTE — Consult Note (Signed)
NEUROLOGY CONSULTATION NOTE   Date of service: Jul 09, 2022 Patient Name: Vicki Mack MRN:  [id number] DOB:  [birth year] Reason for consult: "code stroke for L sided blurred vision and left sided numbness" Requesting Provider: Rexford Maus, DO _ _ _   _ __   _ __ _ _  __ __   _ __   __ _  History of Present Illness  Vicki Mack is a 47 y.o. female with PMH significant for HTN, cerebral aneurysm with no hx of rupture who presents with sudden onset left sided numbness and blurred vision. Was sitting on the couch when her symptoms started. LKW of 1800.   Chart review and discussion with patient reveals a prior similar episode on Nov 27, 2020. Back then, presented with left sided sensory and motor deficit and blurred vision. She had headache with these symptoms.  She does not smoke, no EtOH, no drugs, no hx of DM2, HLD.  LKW: 1800 mRS: 0 tNKASE: not offered, only sensory deficit and too mild to treat Thrombectomy: not offered, only sensory deficit and too mild to treat NIHSS components Score: Comment  1a Level of Conscious 0[x]  1[]  2[]  3[]      1b LOC Questions 0[x]  1[]  2[]       1c LOC Commands 0[x]  1[]  2[]       2 Best Gaze 0[x]  1[]  2[]       3 Visual 0[x]  1[]  2[]  3[]      4 Facial Palsy 0[x]  1[]  2[]  3[]      5a Motor Arm - left 0[x]  1[]  2[]  3[]  4[]  UN[]    5b Motor Arm - Right 0[x]  1[]  2[]  3[]  4[]  UN[]    6a Motor Leg - Left 0[x]  1[]  2[]  3[]  4[]  UN[]    6b Motor Leg - Right 0[x]  1[]  2[]  3[]  4[]  UN[]    7 Limb Ataxia 0[x]  1[]  2[]  3[]  UN[]     8 Sensory 0[]  1[x]  2[]  UN[]      9 Best Language 0[x]  1[]  2[]  3[]      10 Dysarthria 0[x]  1[]  2[]  UN[]      11 Extinct. and Inattention 0[x]  1[]  2[]       TOTAL: 1     ROS   Constitutional Denies weight loss, fever and chills.   HEENT Denies changes in vision and hearing.   Respiratory Denies SOB and cough.   CV Denies palpitations and CP   GI Denies abdominal pain, nausea, vomiting and diarrhea.   GU Denies dysuria and  urinary frequency.   MSK Denies myalgia and joint pain.   Skin Denies rash and pruritus.   Neurological Denies headache and syncope.   Psychiatric Denies recent changes in mood. Denies anxiety and depression.    Past History   Past Medical History:  Diagnosis Date   Abnormal Pap smear    colpo with biopsy at age 38   Aneurysm (HCC)    Bipolar 1 disorder (HCC)    Hypertension    Infertility    able to become pregnant with Clomid   Low iron    PTSD (post-traumatic stress disorder)    seeing neuropsychologist   Past Surgical History:  Procedure Laterality Date   CHOLECYSTECTOMY  2021   NO PAST SURGERIES     Family History  Problem Relation Age of Onset   Diabetes Mother    Hypertension Mother    Diabetes Father    Hypertension Father    Alzheimer's disease Father    Other Sister  breast cysts   Breast cancer Paternal Aunt        had mastectomy   Alzheimer's disease Paternal Aunt    Social History   Socioeconomic History   Marital status: Legally Separated    Spouse name: Not on file   Number of children: 1   Years of education: College   Highest education level: Some college, no degree  Occupational History    Comment: N/a  Tobacco Use   Smoking status: Never   Smokeless tobacco: Never  Vaping Use   Vaping Use: Never used  Substance and Sexual Activity   Alcohol use: No   Drug use: No   Sexual activity: Yes    Birth control/protection: I.U.D.  Other Topics Concern   Not on file  Social History Narrative   Patient lives at home with family.   Caffeine Use: 1 soda weekly   Social Determinants of Health   Financial Resource Strain: Not on file  Food Insecurity: Not on file  Transportation Needs: No Transportation Needs (04/19/2018)   PRAPARE - Administrator, Civil Service (Medical): No    Lack of Transportation (Non-Medical): No  Physical Activity: Not on file  Stress: Not on file  Social Connections: Not on file   Allergies   Allergen Reactions   Dilaudid [Hydromorphone Hcl] Other (See Comments)    Upset stomach   Phenergan [Promethazine Hcl] Diarrhea   Codeine Rash   Penicillins Itching and Rash    Did it involve swelling of the face/tongue/throat, SOB, or low BP? No Did it involve sudden or severe rash/hives, skin peeling, or any reaction on the inside of your mouth or nose? Yes Did you need to seek medical attention at a hospital or doctor's office? Unknown When did it last happen?   baby    If all above answers are "NO", may proceed with cephalosporin use.   Sulfa Antibiotics Rash    Medications  (Not in a hospital admission)    Vitals   Vitals:   07/09/22 1930  BP: (!) 150/111  Pulse: 86  Resp: 17  Temp: 99.6 F (37.6 C)  TempSrc: Oral  SpO2: 100%     There is no height or weight on file to calculate BMI.  Physical Exam   General: Laying comfortably in bed; in no acute distress.  HENT: Normal oropharynx and mucosa. Normal external appearance of ears and nose.  Neck: Supple, no pain or tenderness  CV: No JVD. No peripheral edema.  Pulmonary: Symmetric Chest rise. Normal respiratory effort.  Abdomen: Soft to touch, non-tender.  Ext: No cyanosis, edema, or deformity  Skin: No rash. Normal palpation of skin.   Musculoskeletal: Normal digits and nails by inspection. No clubbing.   Neurologic Examination  Mental status/Cognition: Alert, oriented to self, place, month and year, good attention.  Speech/language: Fluent, comprehension intact, object naming intact, repetition intact.  Cranial nerves:   CN II Pupils equal and reactive to light, no VF deficits    CN III,IV,VI EOM intact, no gaze preference or deviation, no nystagmus    CN V normal sensation in V1, V2, and V3 segments bilaterally    CN VII no asymmetry, no nasolabial fold flattening    CN VIII normal hearing to speech    CN IX & X normal palatal elevation, no uvular deviation    CN XI 5/5 head turn and 5/5 shoulder shrug  bilaterally    CN XII midline tongue protrusion    Motor:  Muscle  bulk: normal, tone normal, pronator drift none tremor none Mvmt Root Nerve  Muscle Right Left Comments  SA C5/6 Ax Deltoid 5 5   EF C5/6 Mc Biceps 5 5   EE C6/7/8 Rad Triceps 5 5   WF C6/7 Med FCR     WE C7/8 PIN ECU     F Ab C8/T1 U ADM/FDI 5 5   HF L1/2/3 Fem Illopsoas 5 5   KE L2/3/4 Fem Quad 5 5   DF L4/5 D Peron Tib Ant 5 5   PF S1/2 Tibial Grc/Sol 5 5    Sensation:  Light touch Mildly decreased to touch in L lower face, LUE and LLE.   Pin prick    Temperature    Vibration   Proprioception    Coordination/Complex Motor:  - Finger to Nose intact BL - Heel to shin intact BL - Rapid alternating movement are normal - Gait: Stride length short. Arm swing poor. Base width narrow. Able to toe walk and heel walk.  Labs   CBC:  Recent Labs  Lab 07/09/22 1940  WBC 8.7  NEUTROABS 4.6  HGB 13.9  HCT 42.5  MCV 89.1  PLT 240    Basic Metabolic Panel:  Lab Results  Component Value Date   NA 137 11/27/2020   K 3.9 11/27/2020   CO2 27 11/27/2020   GLUCOSE 87 11/27/2020   BUN 8 11/27/2020   CREATININE 0.93 11/27/2020   CALCIUM 8.7 (L) 11/27/2020   GFRNONAA >60 11/27/2020   GFRAA >60 11/06/2019   Lipid Panel:  Lab Results  Component Value Date   LDLCALC 118 (H) 10/16/2016   HgbA1c: No results found for: "HGBA1C" Urine Drug Screen:     Component Value Date/Time   LABOPIA NONE DETECTED 11/27/2020 1611   COCAINSCRNUR NONE DETECTED 11/27/2020 1611   LABBENZ NONE DETECTED 11/27/2020 1611   AMPHETMU NONE DETECTED 11/27/2020 1611   THCU NONE DETECTED 11/27/2020 1611   LABBARB NONE DETECTED 11/27/2020 1611    Alcohol Level     Component Value Date/Time   ETH <10 11/27/2020 1611    CT Head without contrast(Personally reviewed): 1. No acute intracranial abnormality. 2. ASPECTS is 10.  MRI Brain: pending  Impression   Vicki Mack is a 47 y.o. female with PMH significant for HTN,  cerebral aneurysm with no hx of rupture who presents with sudden onset left sided numbness and blurred vision. Was sitting on the couch when her symptoms started. LKW of 1800. Her neurologic examination is notable for mild L sided sensory loss.  Symptoms too mild to treat with Tnkase. She did seem to have an identical episode back on Nov 27, 2020 for which she was seen in the ED and MRI back then was negative.  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 Will get MRI Brain to rule out stroke but if negative, follow up with neurology outpatient.  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 Will get MRI Brain to rule out stroke but if negative, follow up with neurology outpatient. ______________________________________________________________________   Thank you for the opportunity to take part in the care of this patient. If you have any further questions, please contact the neurology consultation attending.  Signed,  Erick Blinks [facility] Pager Number [phone] _ _ _   _ __   _ __ _ _  __ __   _ __   __  _  

## 2022-07-09 NOTE — Code Documentation (Addendum)
Responded to Code Stroke called at 1931 on pt that just arrived to the ED at 1928. Code Stroke called for blurred vision in L eye, L sided weakness/decreased sensation, and L eye droop. CBG-136, NIH-1 for L sided sensory deficit. CT head negative for acute changes. TNK not given-symptoms too mild to treat. Pt remains in TNK window until 2230. VS/neuro checks q39min until then. MRI ordered. If negative for stroke may d/c q62min checks.

## 2022-07-09 NOTE — ED Triage Notes (Signed)
Pt in from home via [facility] with LSN 1800. Arrives with L eye blurred vision, L arm weakness and decreased sensation, L eye droop. Code stroke called on ED arrival

## 2022-07-09 NOTE — ED Notes (Signed)
Pt returned to room from MRI. No change and/or worsening in symptoms

## 2022-07-09 NOTE — ED Notes (Signed)
Pt reports that numb sensation on left side is getting better but still present

## 2022-12-17 ENCOUNTER — Emergency Department (HOSPITAL_COMMUNITY): Payer: BLUE CROSS/BLUE SHIELD

## 2022-12-17 ENCOUNTER — Encounter (HOSPITAL_COMMUNITY): Payer: Self-pay

## 2022-12-17 ENCOUNTER — Other Ambulatory Visit: Payer: Self-pay

## 2022-12-17 ENCOUNTER — Ambulatory Visit
Admission: EM | Admit: 2022-12-17 | Discharge: 2022-12-17 | Disposition: A | Discharge status: Home / Self Care | Payer: BLUE CROSS/BLUE SHIELD

## 2022-12-17 ENCOUNTER — Emergency Department (HOSPITAL_COMMUNITY)
Admission: EM | Admit: 2022-12-17 | Discharge: 2022-12-17 | Disposition: A | Discharge status: Home / Self Care | Payer: BLUE CROSS/BLUE SHIELD | Attending: Emergency Medicine | Admitting: Emergency Medicine

## 2022-12-17 DIAGNOSIS — I671 Cerebral aneurysm, nonruptured: Secondary | ICD-10-CM | POA: Insufficient documentation

## 2022-12-17 DIAGNOSIS — R519 Headache, unspecified: Secondary | ICD-10-CM

## 2022-12-17 DIAGNOSIS — H539 Unspecified visual disturbance: Secondary | ICD-10-CM

## 2022-12-17 LAB — DIFFERENTIAL
Abs Immature Granulocytes: 0.03 10*3/uL (ref 0.00–0.07)
Basophils Absolute: 0.1 10*3/uL (ref 0.0–0.1)
Basophils Relative: 1 %
Eosinophils Absolute: 0.4 10*3/uL (ref 0.0–0.5)
Eosinophils Relative: 5 %
Immature Granulocytes: 0 %
Lymphocytes Relative: 32 %
Lymphs Abs: 2.6 10*3/uL (ref 0.7–4.0)
Monocytes Absolute: 0.6 10*3/uL (ref 0.1–1.0)
Monocytes Relative: 8 %
Neutro Abs: 4.4 10*3/uL (ref 1.7–7.7)
Neutrophils Relative %: 54 %

## 2022-12-17 LAB — CBC
HCT: 42.8 % (ref 36.0–46.0)
Hemoglobin: 13.8 g/dL (ref 12.0–15.0)
MCH: 29.2 pg (ref 26.0–34.0)
MCHC: 32.2 g/dL (ref 30.0–36.0)
MCV: 90.5 fL (ref 80.0–100.0)
Platelets: 264 10*3/uL (ref 150–400)
RBC: 4.73 MIL/uL (ref 3.87–5.11)
RDW: 13.2 % (ref 11.5–15.5)
WBC: 8.1 10*3/uL (ref 4.0–10.5)
nRBC: 0 % (ref 0.0–0.2)

## 2022-12-17 LAB — ETHANOL: Alcohol, Ethyl (B): <10 mg/dL (ref <10)

## 2022-12-17 LAB — COMPREHENSIVE METABOLIC PANEL
ALT: 13 U/L (ref 0–44)
AST: 16 U/L (ref 15–41)
Albumin: 3.6 g/dL (ref 3.5–5.0)
Alkaline Phosphatase: 56 U/L (ref 38–126)
Anion gap: 6 (ref 5–15)
BUN: 8 mg/dL (ref 6–20)
CO2: 24 mmol/L (ref 22–32)
Calcium: 8.9 mg/dL (ref 8.9–10.3)
Chloride: 107 mmol/L (ref 98–111)
Creatinine, Ser: 1 mg/dL (ref 0.44–1.00)
GFR, Estimated: >60 mL/min (ref >60)
Glucose, Bld: 101 mg/dL — ABNORMAL HIGH (ref 70–99)
Potassium: 4.1 mmol/L (ref 3.5–5.1)
Sodium: 137 mmol/L (ref 135–145)
Total Bilirubin: 0.3 mg/dL (ref 0.3–1.2)
Total Protein: 6.5 g/dL (ref 6.5–8.1)

## 2022-12-17 LAB — I-STAT CHEM 8, ED
BUN: 9 mg/dL (ref 6–20)
Calcium, Ion: 1.2 mmol/L (ref 1.15–1.40)
Chloride: 105 mmol/L (ref 98–111)
Creatinine, Ser: 1.1 mg/dL — ABNORMAL HIGH (ref 0.44–1.00)
Glucose, Bld: 98 mg/dL (ref 70–99)
HCT: 42 % (ref 36.0–46.0)
Hemoglobin: 14.3 g/dL (ref 12.0–15.0)
Potassium: 4.1 mmol/L (ref 3.5–5.1)
Sodium: 139 mmol/L (ref 135–145)
TCO2: 24 mmol/L (ref 22–32)

## 2022-12-17 LAB — APTT: aPTT: 26 s (ref 24–36)

## 2022-12-17 LAB — HCG, SERUM, QUALITATIVE: Preg, Serum: NEGATIVE

## 2022-12-17 LAB — PROTIME-INR
INR: 1 (ref 0.8–1.2)
Prothrombin Time: 13.7 s (ref 11.4–15.2)

## 2022-12-17 MED ORDER — DIAZEPAM 5 MG/ML IJ SOLN
2.5000 mg | Freq: Once | INTRAMUSCULAR | Status: AC
  Administered 2022-12-17: 2.5 mg via INTRAVENOUS
  Filled 2022-12-17: qty 2

## 2022-12-17 MED ORDER — SODIUM CHLORIDE 0.9% FLUSH
3.0000 mL | Freq: Once | INTRAVENOUS | Status: AC
  Administered 2022-12-17: 3 mL via INTRAVENOUS

## 2022-12-17 MED ORDER — DROPERIDOL 2.5 MG/ML IJ SOLN
1.2500 mg | Freq: Once | INTRAMUSCULAR | Status: AC
  Administered 2022-12-17: 1.25 mg via INTRAVENOUS
  Filled 2022-12-17: qty 2

## 2022-12-17 MED ORDER — IOHEXOL 350 MG/ML SOLN
75.0000 mL | Freq: Once | INTRAVENOUS | Status: AC | PRN
  Administered 2022-12-17: 75 mL via INTRAVENOUS

## 2022-12-17 NOTE — ED Notes (Signed)
Cbg completed 1717

## 2022-12-17 NOTE — ED Triage Notes (Signed)
"  I have had a headache for 2 days and now my vision is blurry in my right eye". "I have had a brain aneurysm before".

## 2022-12-17 NOTE — Discharge Instructions (Signed)

## 2022-12-17 NOTE — ED Triage Notes (Signed)
Pt c/o Ha on right side of head, blurred vision in right eyex2-3d.

## 2022-12-17 NOTE — ED Provider Notes (Signed)
Patient here today for evaluation of headache she has had for 2 days.  She notes that she has had some blurry vision in her right eye.  She does have history of brain aneurysm and recommended further evaluation in the emergency room.  Patient expressed understanding and will have someone come get her to transport her to transport via POV.   Tomi Bamberger, PA-C 12/17/22 1535

## 2022-12-17 NOTE — ED Notes (Signed)
Patient is being discharged from the Urgent Care and sent to the Emergency Department via Private Vehicle (Sister in Social worker) . Per R. Reggie Pile, patient is in need of higher level of care due to [place], Vision Changes. History of Aneurysm. Patient is aware and verbalizes understanding of plan of care.  Vitals:   12/17/22 1527  BP: 139/84  Pulse: 85  Resp: 18  Temp: 98.5 F (36.9 C)  SpO2: 98%

## 2022-12-17 NOTE — ED Provider Notes (Signed)
[place] EMERGENCY DEPARTMENT AT [facility] Provider Note   CSN: [id number] Arrival date & time: 12/17/22  1605     History  Chief Complaint  Patient presents with   Headache    Vicki Mack is a 47 y.o. female.  Who presents emergency department for chief complaint of headache.  Patient reports 2 to 3 days of constant right-sided throbbing headache with blurry vision.  She has a known history of right sided 2 mm ICA aneurysm and was sent in for further evaluation.  Unfortunately triage imaging was ordered prior to my evaluation was for plain CT which I reviewed this shows no evidence of acute hemorrhage.  Patient states she has had right sided headache in the past with blurry vision however this is worse than normal.  She was sent in from an urgent care for further evaluation.  She has tried Tylenol which has helped some.  She is never had a headache last this long.  She physic her memory is impaired which is new.  Denies visual field cut.   Headache      Home Medications Prior to Admission medications   Medication Sig Start Date End Date Taking? Authorizing Provider  acetaminophen (TYLENOL) 325 MG tablet Take 2 tablets (650 mg total) by mouth every 6 (six) hours as needed for moderate pain. 06/15/21   Wallis Bamberg, PA-C  albuterol (VENTOLIN HFA) 108 (90 Base) MCG/ACT inhaler Inhale 2 puffs into the lungs every 4 (four) hours as needed for wheezing or shortness of breath. 05/15/22   Banister, Janace Aris, MD  benzonatate (TESSALON) 100 MG capsule Take 1 capsule (100 mg total) by mouth 3 (three) times daily as needed for cough. 05/15/22   Zenia Resides, MD  Ibuprofen-diphenhydrAMINE Cit (ADVIL PM PO) Take by mouth.    [provider]  Levonorgestrel (LILETTA, 52 MG, IU) 1 application by Intrauterine route once. Since June 2017    [provider]  metoprolol succinate (TOPROL XL) 50 MG 24 hr tablet Take 1 tablet (50 mg total) by mouth daily. Take  with or immediately following a meal. 12/09/21   Domenick Gong, MD      Allergies    Aspirin, Codeine, Sulfa antibiotics, Penicillins, Dilaudid [hydromorphone hcl], and Phenergan [promethazine hcl]    Review of Systems   Review of Systems  Neurological:  Positive for headaches.    Physical Exam Updated Vital Signs BP 129/77   Pulse 65   Temp 97.9 F (36.6 C) (Oral)   Resp 20   Ht 5\' 7"  (1.702 m)   Wt 64 kg   SpO2 100%   BMI 22.10 kg/m  Physical Exam Physical Exam  Constitutional: Pt is oriented to person, place, and time. Pt appears well-developed and well-nourished. No distress.  HENT:  Head: Normocephalic and atraumatic.  Mouth/Throat: Oropharynx is clear and moist.  Eyes: Conjunctivae and EOM are normal. Pupils are equal, round, and reactive to light. No scleral icterus.  No horizontal, vertical or rotational nystagmus  Neck: Normal range of motion. Neck supple.  Full active and passive ROM without pain No midline or paraspinal tenderness No nuchal rigidity or meningeal signs  Cardiovascular: Normal rate, regular rhythm and intact distal pulses.   Pulmonary/Chest: Effort normal and breath sounds normal. No respiratory distress. Pt has no wheezes. No rales.  Abdominal: Soft. Bowel sounds are normal. There is no tenderness. There is no rebound and no guarding.  Musculoskeletal: Normal range of motion.  Lymphadenopathy:    No  cervical adenopathy.  Neurological: Pt. is alert and oriented to person, place, and time. He has normal reflexes. No cranial nerve deficit.  Exhibits normal muscle tone. Coordination normal.  Mental Status:  Alert, oriented, thought content appropriate. Speech fluent without evidence of aphasia. Able to follow 2 step commands without difficulty.  Cranial Nerves:  II:  Peripheral visual fields grossly normal, pupils equal, round, reactive to light III,IV, VI: ptosis not present, extra-ocular motions intact bilaterally  V,VII: smile symmetric,  facial light touch sensation equal VIII: hearing grossly normal bilaterally  IX,X: midline uvula rise  XI: bilateral shoulder shrug equal and strong XII: midline tongue extension  Motor:  5/5 in upper and lower extremities bilaterally including strong and equal grip strength and dorsiflexion/plantar flexion Sensory: Pinprick and light touch normal in all extremities.  Deep Tendon Reflexes: 2+ and symmetric  Cerebellar: normal finger-to-nose with bilateral upper extremities Gait: normal gait and balance CV: distal pulses palpable throughout   Skin: Skin is warm and dry. No rash noted. Pt is not diaphoretic.  Psychiatric: Pt has a normal mood and affect. Behavior is normal. Judgment and thought content normal.  Nursing note and vitals reviewed.  ED Results / Procedures / Treatments   Labs (all labs ordered are listed, but only abnormal results are displayed) Labs Reviewed  COMPREHENSIVE METABOLIC PANEL - Abnormal; Notable for the following components:      Result Value   Glucose, Bld 101 (*)    All other components within normal limits  I-STAT CHEM 8, ED - Abnormal; Notable for the following components:   Creatinine, Ser 1.10 (*)    All other components within normal limits  PROTIME-INR  APTT  CBC  DIFFERENTIAL  ETHANOL  HCG, SERUM, QUALITATIVE  CBG MONITORING, ED    EKG EKG Interpretation Date/Time:  Thursday December 17 2022 18:32:59 EDT Ventricular Rate:  68 PR Interval:    QRS Duration:  93 QT Interval:  418 QTC Calculation: 445 R Axis:   85  Text Interpretation: Normal sinus rhythm Confirmed by Vonita Moss [phone]) on 12/17/2022 6:35:15 PM  Radiology CT HEAD WO CONTRAST  Result Date: 12/17/2022 CLINICAL DATA:  Headache, stroke suspected EXAM: CT HEAD WITHOUT CONTRAST TECHNIQUE: Contiguous axial images were obtained from the base of the skull through the vertex without intravenous contrast. RADIATION DOSE REDUCTION: This exam was performed according to the  departmental dose-optimization program which includes automated exposure control, adjustment of the mA and/or kV according to patient size and/or use of iterative reconstruction technique. COMPARISON:  07/09/2022 CT head FINDINGS: Brain: No evidence of acute infarction, hemorrhage, mass, mass effect, or midline shift. No hydrocephalus or extra-axial fluid collection. Vascular: No hyperdense vessel. Skull: Negative for fracture or focal lesion. Sinuses/Orbits: No acute finding. Other: The mastoid air cells are well aerated. IMPRESSION: No acute intracranial process. Electronically Signed   By: Wiliam Ke M.D.   On: 12/17/2022 17:41    Procedures Procedures    Medications Ordered in ED Medications  sodium chloride flush (NS) 0.9 % injection 3 mL (3 mLs Intravenous Given 12/17/22 1905)    ED Course/ Medical Decision Making/ A&P Clinical Course as of 12/17/22 2050  Thu Dec 17, 2022  2049 CT HEAD WO CONTRAST [AH]  2049 CT Angio Head W or Wo Contrast I visualized and interpreted CT angiogram of the head which shows 2 mm saccular aneurysm of the right ICA unchanged from previous images. [AH]    Clinical Course User Index [AH] Arthor Captain, PA-C  Medical Decision Making Vicki Mack presents with headache Given the large differential diagnosis for Vicki Mack, the decision making in this case is of high complexity.  After evaluating all of the data points in this case, the presentation of Vicki Mack is NOT consistent with skull fracture, meningitis/encephalitis, SAH/sentinel bleed, Intracranial Hemorrhage (ICH) (subdural/epidural), acute obstructive hydrocephalus, space occupying lesions, CVA, CO Poisoning, Basilar/vertebral artery dissection, preeclampsia, cerebral venous thrombosis, hypertensive emergency, temporal Arteritis, Idiopathic Intracranial Hypertension (pseudotumor cerebri). \ Treated in the emergency department with  droperidol and Valium with complete resolution of her headache  Strict return and follow-up precautions have been given by me personally or by detailed written instructions verbalized by nursing staff using the teach back method to patient/family/caregiver.  Data Reviewed/Counseling: I have reviewed the patient's vital signs, nursing notes, and other relevant tests/information. I had a detailed discussion regarding the historical points, exam findings, and any diagnostic results supporting the discharge diagnosis. I also discussed the need for outpatient follow-up and the need to return to the ED if symptoms worsen or if there are any questions or concerns that arise at hom    Amount and/or Complexity of Data Reviewed Labs: ordered.    Details: No acute findings on labs Radiology: ordered and independent interpretation performed. Decision-making details documented in ED Course.    Details: I personally visualized and interpreted the images using our PACS system. Acute findings include:  Ct/ cta out acute abnormality.  2 mm aneurysm in the right ICA stable over the past several years   Risk Prescription drug management. Risk Details: Hx of Aneurysm           Final Clinical Impression(s) / ED Diagnoses Final diagnoses:  Bad headache  Cerebral aneurysm    Rx / DC Orders ED Discharge Orders     None         Arthor Captain, PA-C 12/18/22 0043    Rondel Baton, MD 12/18/22 1659

## 2023-05-31 ENCOUNTER — Ambulatory Visit
Admission: EM | Admit: 2023-05-31 | Discharge: 2023-05-31 | Disposition: A | Discharge status: Home / Self Care | Attending: Family Medicine | Admitting: Family Medicine

## 2023-05-31 ENCOUNTER — Encounter: Payer: Self-pay | Admitting: *Deleted

## 2023-05-31 ENCOUNTER — Other Ambulatory Visit: Payer: Self-pay

## 2023-05-31 DIAGNOSIS — S81852A Open bite, left lower leg, initial encounter: Secondary | ICD-10-CM | POA: Diagnosis not present

## 2023-05-31 DIAGNOSIS — L089 Local infection of the skin and subcutaneous tissue, unspecified: Secondary | ICD-10-CM

## 2023-05-31 DIAGNOSIS — L259 Unspecified contact dermatitis, unspecified cause: Secondary | ICD-10-CM

## 2023-05-31 MED ORDER — TRIAMCINOLONE ACETONIDE 0.025 % EX OINT: 1.0000 | TOPICAL_OINTMENT | Freq: Two times a day (BID) | CUTANEOUS | 0 refills | Status: DC | PRN

## 2023-05-31 MED ORDER — PREDNISONE 10 MG PO TABS: 10.0000 mg | ORAL_TABLET | Freq: Every day | ORAL | 0 refills | Status: AC

## 2023-05-31 MED ORDER — DOXYCYCLINE HYCLATE 100 MG PO CAPS
100.0000 mg | ORAL_CAPSULE | Freq: Two times a day (BID) | ORAL | 0 refills | Status: AC
Start: 2023-05-31 — End: 2023-06-07

## 2023-05-31 NOTE — ED Provider Notes (Signed)
 [facility]    CSN: [id number] Arrival date & time: 05/31/23  1141      History   Chief Complaint Chief Complaint  Patient presents with   Insect Bite    HPI Vicki Mack is a 48 y.o. female.   HPI Presents urgent care for evaluation of a bug bite she developed on yesterday.  She reports she was working out in her yard and upon coming back in the house she noticed a itchy, burning area on the lower portion of her left hip. Today noticing that the area had a indurated tender area in the middle of the rash.  She endorses that the area is pruritic and burning at the same time.  Has any known allergies to any outdoor insects. Endorses is that she was wearing long pants yesterday and is uncertain what could have welted in a insect bite. Past Medical History:  Diagnosis Date   Abnormal Pap smear    colpo with biopsy at age 50   Aneurysm [facility])    Bipolar 1 disorder (HCC)    Hypertension    Infertility    able to become pregnant with Clomid   Low iron    PTSD (post-traumatic stress disorder)    seeing neuropsychologist    Patient Active Problem List   Diagnosis Date Noted   Intracranial aneurysm 10/12/2018   Viral URI with cough 05/03/2018   Migraine headache without aura 08/27/2011   SAB (spontaneous abortion) 08/27/2011    Past Surgical History:  Procedure Laterality Date   CHOLECYSTECTOMY  2021   NO PAST SURGERIES      OB History     Gravida  3   Para  1   Term  1   Preterm      AB  2   Living  1      SAB  2   IAB      Ectopic      Multiple      Live Births  1            Home Medications    Prior to Admission medications   Medication Sig Start Date End Date Taking? Authorizing Provider  acetaminophen (TYLENOL) 325 MG tablet Take 2 tablets (650 mg total) by mouth every 6 (six) hours as needed for moderate pain. 06/15/21  Yes Wallis Bamberg, PA-C  albuterol (VENTOLIN HFA) 108 (90 Base) MCG/ACT inhaler Inhale 2 puffs  into the lungs every 4 (four) hours as needed for wheezing or shortness of breath. 05/15/22  Yes Banister, Janace Aris, MD  Ibuprofen-diphenhydrAMINE Cit (ADVIL PM PO) Take by mouth.   Yes [provider]  Levonorgestrel (LILETTA, 52 MG, IU) 1 application by Intrauterine route once. Since June 2017   Yes [provider]  metoprolol succinate (TOPROL XL) 50 MG 24 hr tablet Take 1 tablet (50 mg total) by mouth daily. Take with or immediately following a meal. 12/09/21  Yes Domenick Gong, MD  benzonatate (TESSALON) 100 MG capsule Take 1 capsule (100 mg total) by mouth 3 (three) times daily as needed for cough. Patient not taking: Reported on 05/31/2023 05/15/22   Zenia Resides, MD  doxycycline (VIBRAMYCIN) 100 MG capsule Take 1 capsule (100 mg total) by mouth 2 (two) times daily for 7 days. 05/31/23 06/07/23 Yes Bing Neighbors, NP  predniSONE (DELTASONE) 10 MG tablet Take 1 tablet (10 mg total) by mouth daily with breakfast for 5 days. 05/31/23 06/05/23 Yes Bing Neighbors, NP  triamcinolone (KENALOG) 0.025 % ointment Apply 1 Application topically 2 (two) times daily as needed (rash). 05/31/23  Yes Buena Carmine, NP    Family History Family History  Problem Relation Age of Onset   Diabetes Mother    Hypertension Mother    Diabetes Father    Hypertension Father    Alzheimer's disease Father    Other Sister        breast cysts   Breast cancer Paternal Aunt        had mastectomy   Alzheimer's disease Paternal Aunt     Social History Social History   Tobacco Use   Smoking status: Never   Smokeless tobacco: Never  Vaping Use   Vaping status: Never Used  Substance Use Topics   Alcohol use: No   Drug use: No     Allergies   Aspirin, Codeine, Sulfa antibiotics, Penicillins, Dilaudid [hydromorphone hcl], and Phenergan [promethazine hcl]   Review of Systems Review of Systems Pertinent negatives listed in HPI   Physical Exam Triage Vital Signs ED Triage  Vitals  Encounter Vitals Group     BP 05/31/23 1218 130/84     Systolic BP Percentile --      Diastolic BP Percentile --      Pulse Rate 05/31/23 1218 78     Resp 05/31/23 1218 16     Temp 05/31/23 1218 98.5 F (36.9 C)     Temp Source 05/31/23 1218 Oral     SpO2 05/31/23 1218 98 %     Weight --      Height --      Head Circumference --      Peak Flow --      Pain Score 05/31/23 1215 7     Pain Loc --      Pain Education --      Exclude from Growth Chart --    No data found.  Updated Vital Signs BP 130/84 (BP Location: Left Arm)   Pulse 78   Temp 98.5 F (36.9 C) (Oral)   Resp 16   LMP 05/27/2023   SpO2 98%   Visual Acuity Right Eye Distance:   Left Eye Distance:   Bilateral Distance:    Right Eye Near:   Left Eye Near:    Bilateral Near:     Physical Exam Vitals reviewed.  Constitutional:      Appearance: Normal appearance.  HENT:     Head: Normocephalic and atraumatic.  Eyes:     Extraocular Movements: Extraocular movements intact.     Pupils: Pupils are equal, round, and reactive to light.  Cardiovascular:     Rate and Rhythm: Normal rate and regular rhythm.  Pulmonary:     Effort: Pulmonary effort is normal.     Breath sounds: Normal breath sounds.  Musculoskeletal:     Cervical back: Normal range of motion.  Skin:      Neurological:     General: No focal deficit present.     Mental Status: She is alert and oriented to person, place, and time.      UC Treatments / Results  Labs (all labs ordered are listed, but only abnormal results are displayed) Labs Reviewed - No data to display  EKG   Radiology No results found.  Procedures Procedures (including critical care time)  Medications Ordered in UC Medications - No data to display  Initial Impression / Assessment and Plan / UC Course  I have reviewed the triage vital signs  and the nursing notes.  Pertinent labs & imaging results that were available during my care of the patient  were reviewed by me and considered in my medical decision making (see chart for details).   Infected bite lower leg, left and  Acute contact dermatitis treatment with prednisone 10 mg x 5 days.  Triamcinolone BID PRN. Doxycycline 100 mg BID x 7 days. Return if needed.  Final Clinical Impressions(s) / UC Diagnoses   Final diagnoses:  Infected bite of lower leg, left, initial encounter  Acute contact dermatitis     Discharge Instructions      Start prednisone 10 mg daily for 5 days to decrease the itching and inflammation from irritant that is caused the rash to develop.  Apply triamcinolone ointment twice daily as needed directly to rash this will help with acute itching.  For preventative against any skin infection start doxycycline twice daily for 7 days.  Return as needed.     ED Prescriptions     Medication Sig Dispense Auth. Provider   predniSONE (DELTASONE) 10 MG tablet Take 1 tablet (10 mg total) by mouth daily with breakfast for 5 days. 5 tablet Yousof Alderman S, NP   triamcinolone (KENALOG) 0.025 % ointment Apply 1 Application topically 2 (two) times daily as needed (rash). 90 g Buena Carmine, NP   doxycycline (VIBRAMYCIN) 100 MG capsule Take 1 capsule (100 mg total) by mouth 2 (two) times daily for 7 days. 14 capsule Buena Carmine, NP      PDMP not reviewed this encounter.   Buena Carmine, NP 05/31/23 1313

## 2023-05-31 NOTE — Discharge Instructions (Signed)
 Start prednisone 10 mg daily for 5 days to decrease the itching and inflammation from irritant that is caused the rash to develop.  Apply triamcinolone ointment twice daily as needed directly to rash this will help with acute itching.  For preventative against any skin infection start doxycycline twice daily for 7 days.  Return as needed.

## 2023-05-31 NOTE — ED Triage Notes (Signed)
 Pt reports she has ?insect bite to left buttock since yesterday when she was working outside and felt pain in the area. States area red and swollen. She has taken benadryl and tried home remedies

## 2023-07-25 ENCOUNTER — Ambulatory Visit (INDEPENDENT_AMBULATORY_CARE_PROVIDER_SITE_OTHER)

## 2023-07-25 ENCOUNTER — Ambulatory Visit (HOSPITAL_COMMUNITY)
Admission: EM | Admit: 2023-07-25 | Discharge: 2023-07-25 | Disposition: A | Discharge status: Home / Self Care | Attending: Emergency Medicine | Admitting: Emergency Medicine

## 2023-07-25 ENCOUNTER — Encounter (HOSPITAL_COMMUNITY): Payer: Self-pay

## 2023-07-25 DIAGNOSIS — S99921A Unspecified injury of right foot, initial encounter: Secondary | ICD-10-CM

## 2023-07-25 DIAGNOSIS — S93601A Unspecified sprain of right foot, initial encounter: Secondary | ICD-10-CM | POA: Diagnosis not present

## 2023-07-25 MED ORDER — KETOROLAC TROMETHAMINE 30 MG/ML IJ SOLN
INTRAMUSCULAR | Status: AC
  Filled 2023-07-25: qty 1

## 2023-07-25 MED ORDER — FAMOTIDINE 20 MG PO TABS: 20.0000 mg | ORAL_TABLET | Freq: Two times a day (BID) | ORAL | 0 refills | Status: AC

## 2023-07-25 MED ORDER — NAPROXEN 500 MG PO TABS: 500.0000 mg | ORAL_TABLET | Freq: Two times a day (BID) | ORAL | 0 refills | Status: AC

## 2023-07-25 MED ORDER — KETOROLAC TROMETHAMINE 30 MG/ML IJ SOLN
30.0000 mg | Freq: Once | INTRAMUSCULAR | Status: AC
  Administered 2023-07-25: 30 mg via INTRAMUSCULAR

## 2023-07-25 NOTE — ED Provider Notes (Signed)
 [facility]    CSN: [id number] Arrival date & time: 07/25/23  1613      History   Chief Complaint Chief Complaint  Patient presents with   Foot Injury    HPI Vicki Mack is a 48 y.o. female.   Patient presents with right foot pain.  Patient states that on the evening of 6/6 that she stubbed her pinky toe on her right foot on the end of her bed.  Patient states since then she has had some foot pain and swelling.  Patient reports she has been taking extra strength Tylenol  with minimal relief.  Patient denies any other injuries.  The history is provided by the patient and medical records.  Foot Injury   Past Medical History:  Diagnosis Date   Abnormal Pap smear    colpo with biopsy at age 56   Aneurysm [facility])    Bipolar 1 disorder (HCC)    Hypertension    Infertility    able to become pregnant with Clomid   Low iron    PTSD (post-traumatic stress disorder)    seeing neuropsychologist    Patient Active Problem List   Diagnosis Date Noted   Intracranial aneurysm 10/12/2018   Viral URI with cough 05/03/2018   Migraine headache without aura 08/27/2011   SAB (spontaneous abortion) 08/27/2011    Past Surgical History:  Procedure Laterality Date   CHOLECYSTECTOMY  2021   NO PAST SURGERIES      OB History     Gravida  3   Para  1   Term  1   Preterm      AB  2   Living  1      SAB  2   IAB      Ectopic      Multiple      Live Births  1            Home Medications    Prior to Admission medications   Medication Sig Start Date End Date Taking? Authorizing Provider  famotidine (PEPCID) 20 MG tablet Take 1 tablet (20 mg total) by mouth 2 (two) times daily. 07/25/23  Yes Rosevelt Constable, Shadiyah Wernli A, NP  naproxen  (NAPROSYN ) 500 MG tablet Take 1 tablet (500 mg total) by mouth 2 (two) times daily. 07/25/23  Yes Levora Reas A, NP  acetaminophen  (TYLENOL ) 325 MG tablet Take 2 tablets (650 mg total) by mouth every 6 (six) hours as needed  for moderate pain. 06/15/21   Adolph Hoop, PA-C  albuterol  (VENTOLIN  HFA) 108 [phone] Base) MCG/ACT inhaler Inhale 2 puffs into the lungs every 4 (four) hours as needed for wheezing or shortness of breath. 05/15/22   Ann Keto, MD  Ibuprofen -diphenhydrAMINE  Cit (ADVIL  PM PO) Take by mouth.    [provider]  Levonorgestrel  (LILETTA , 52 MG, IU) 1 application by Intrauterine route once. Since June 2017    [provider]  metoprolol  succinate (TOPROL  XL) 50 MG 24 hr tablet Take 1 tablet (50 mg total) by mouth daily. Take with or immediately following a meal. 12/09/21   Ethlyn Herd, MD    Family History Family History  Problem Relation Age of Onset   Diabetes Mother    Hypertension Mother    Diabetes Father    Hypertension Father    Alzheimer's disease Father    Other Sister        breast cysts   Breast cancer Paternal Aunt        had  mastectomy   Alzheimer's disease Paternal Aunt     Social History Social History   Tobacco Use   Smoking status: Never   Smokeless tobacco: Never  Vaping Use   Vaping status: Never Used  Substance Use Topics   Alcohol use: No   Drug use: No     Allergies   Aspirin , Codeine, Sulfa antibiotics, Penicillins, Dilaudid [hydromorphone hcl], and Phenergan [promethazine hcl]   Review of Systems Review of Systems  Per HPI  Physical Exam Triage Vital Signs ED Triage Vitals [07/25/23 1644]  Encounter Vitals Group     BP 128/85     Systolic BP Percentile      Diastolic BP Percentile      Pulse Rate 67     Resp 16     Temp 98.7 F (37.1 C)     Temp Source Oral     SpO2 96 %     Weight      Height      Head Circumference      Peak Flow      Pain Score 9     Pain Loc      Pain Education      Exclude from Growth Chart    No data found.  Updated Vital Signs BP 128/85 (BP Location: Left Arm)   Pulse 67   Temp 98.7 F (37.1 C) (Oral)   Resp 16   LMP 06/25/2023 (Approximate)   SpO2 96%   Visual  Acuity Right Eye Distance:   Left Eye Distance:   Bilateral Distance:    Right Eye Near:   Left Eye Near:    Bilateral Near:     Physical Exam Vitals and nursing note reviewed.  Constitutional:      General: She is awake. She is not in acute distress.    Appearance: Normal appearance. She is well-developed and well-groomed. She is not ill-appearing.  Musculoskeletal:     Right foot: Normal range of motion. Swelling and tenderness present. No deformity.     Comments: Mild swelling and tenderness noted to the dorsal lateral aspect of the right foot.  Neurological:     Mental Status: She is alert.  Psychiatric:        Behavior: Behavior is cooperative.      UC Treatments / Results  Labs (all labs ordered are listed, but only abnormal results are displayed) Labs Reviewed - No data to display  EKG   Radiology DG Foot Complete Right Result Date: 07/25/2023 CLINICAL DATA:  Right foot pain, hit bed. EXAM: RIGHT FOOT COMPLETE - 3+ VIEW COMPARISON:  01/26/2017 FINDINGS: Chronic stable deformity of the 2nd metatarsal head in the area of prior surgical changes. No acute fracture, subluxation or dislocation. Soft tissues are intact. IMPRESSION: No acute bony abnormality. Electronically Signed   By: Janeece Mechanic M.D.   On: 07/25/2023 17:18    Procedures Procedures (including critical care time)  Medications Ordered in UC Medications  ketorolac  (TORADOL ) 30 MG/ML injection 30 mg (has no administration in time range)    Initial Impression / Assessment and Plan / UC Course  I have reviewed the triage vital signs and the nursing notes.  Pertinent labs & imaging results that were available during my care of the patient were reviewed by me and considered in my medical decision making (see chart for details).     Patient is well-appearing.  Vitals are stable.  Upon assessment there is mild swelling and tenderness noted to the dorsal lateral  aspect of the right foot.  X-ray ordered.   Based on my interpretation there are no acute findings on x-ray.  Radiology report confirms this.  Provided patient with Ace wrap and given IM Toradol  in clinic for acute pain.  Prescribe naproxen  with a combination of Pepcid due to history of peptic ulcer disease.  Recommended continuing Tylenol  as needed for pain.  Given orthopedic follow-up.  Discussed follow-up and return precautions. Final Clinical Impressions(s) / UC Diagnoses   Final diagnoses:  Injury of right foot, initial encounter  Sprain of right foot, initial encounter     Discharge Instructions      Your x-ray is negative for any fracture, dislocation, or injury.  It is likely that you have sprained your foot.   You received injection of Toradol  here today in clinic to help with your pain. I prescribed naproxen  that you can take twice daily as needed for pain.  I recommend taking Pepcid with this to help coat your stomach due to your history of peptic ulcer disease. Otherwise you can continue to take Tylenol  every 6-8 hours as needed for pain. Apply ice and heat to help with pain. Rest and elevate your foot often. Wear Ace wrap to help compress your foot and reduce swelling. Follow-up with [facility] if your pain continues. Return here as needed.  ED Prescriptions     Medication Sig Dispense Auth. Provider   naproxen  (NAPROSYN ) 500 MG tablet Take 1 tablet (500 mg total) by mouth 2 (two) times daily. 30 tablet Levora Reas A, NP   famotidine (PEPCID) 20 MG tablet Take 1 tablet (20 mg total) by mouth 2 (two) times daily. 30 tablet Levora Reas A, NP      PDMP not reviewed this encounter.   Levora Reas A, NP 07/25/23 [id number]

## 2023-07-25 NOTE — ED Triage Notes (Signed)
 Patient here today with c/o right foot pain since Friday evening after stubbing to on the end of the bed.

## 2023-07-25 NOTE — Discharge Instructions (Signed)
 Your x-ray is negative for any fracture, dislocation, or injury.  It is likely that you have sprained your foot.   You received injection of Toradol  here today in clinic to help with your pain. I prescribed naproxen  that you can take twice daily as needed for pain.  I recommend taking Pepcid with this to help coat your stomach due to your history of peptic ulcer disease. Otherwise you can continue to take Tylenol  every 6-8 hours as needed for pain. Apply ice and heat to help with pain. Rest and elevate your foot often. Wear Ace wrap to help compress your foot and reduce swelling. Follow-up with [facility] if your pain continues. Return here as needed.

## 2023-08-10 NOTE — Telephone Encounter (Signed)
Pt informed

## 2023-08-24 NOTE — Telephone Encounter (Signed)
 Please place order for imaging.  Plan - CTA head and neck and follow up apt in August 2025   Rierra - FYI

## 2023-08-30 NOTE — Telephone Encounter (Signed)
 This patient had imaging in 2024 and this is an unchanged aneurysm. Can we make sure someone else is not following her for this? Happy to do a phone visit with her but she doesn't need new imaging since it looks exactly the same from 2020.

## 2023-08-31 NOTE — Telephone Encounter (Signed)
 I contacted the patient at the number we have on file to ask what was directed in the previous message. The phone number is disconnected. If and when the patient returns the call please ask the following question and advise Delon and/or schedule the CPV . Thanks

## 2023-10-14 ENCOUNTER — Encounter (HOSPITAL_COMMUNITY): Payer: Self-pay

## 2023-10-14 ENCOUNTER — Emergency Department (HOSPITAL_COMMUNITY)

## 2023-10-14 ENCOUNTER — Emergency Department (HOSPITAL_COMMUNITY)
Admission: EM | Admit: 2023-10-14 | Discharge: 2023-10-14 | Disposition: A | Discharge status: Home / Self Care | Source: Ambulatory Visit | Attending: Emergency Medicine | Admitting: Emergency Medicine

## 2023-10-14 ENCOUNTER — Other Ambulatory Visit: Payer: Self-pay

## 2023-10-14 DIAGNOSIS — J45901 Unspecified asthma with (acute) exacerbation: Secondary | ICD-10-CM | POA: Diagnosis not present

## 2023-10-14 DIAGNOSIS — Z7951 Long term (current) use of inhaled steroids: Secondary | ICD-10-CM | POA: Diagnosis not present

## 2023-10-14 DIAGNOSIS — Z79899 Other long term (current) drug therapy: Secondary | ICD-10-CM | POA: Diagnosis not present

## 2023-10-14 DIAGNOSIS — R0602 Shortness of breath: Secondary | ICD-10-CM | POA: Diagnosis present

## 2023-10-14 LAB — CBC
HCT: 45 % (ref 36.0–46.0)
Hemoglobin: 14.6 g/dL (ref 12.0–15.0)
MCH: 29 pg (ref 26.0–34.0)
MCHC: 32.4 g/dL (ref 30.0–36.0)
MCV: 89.5 fL (ref 80.0–100.0)
Platelets: 259 K/uL (ref 150–400)
RBC: 5.03 MIL/uL (ref 3.87–5.11)
RDW: 14.5 % (ref 11.5–15.5)
WBC: 9.5 K/uL (ref 4.0–10.5)
nRBC: 0 % (ref 0.0–0.2)

## 2023-10-14 LAB — BASIC METABOLIC PANEL WITH GFR
Anion gap: 8 (ref 5–15)
BUN: <5 mg/dL — ABNORMAL LOW (ref 6–20)
CO2: 20 mmol/L — ABNORMAL LOW (ref 22–32)
Calcium: 9.2 mg/dL (ref 8.9–10.3)
Chloride: 107 mmol/L (ref 98–111)
Creatinine, Ser: 0.78 mg/dL (ref 0.44–1.00)
GFR, Estimated: >60 mL/min (ref >60)
Glucose, Bld: 123 mg/dL — ABNORMAL HIGH (ref 70–99)
Potassium: 4.2 mmol/L (ref 3.5–5.1)
Sodium: 135 mmol/L (ref 135–145)

## 2023-10-14 LAB — RESP PANEL BY RT-PCR (RSV, FLU A&B, COVID)  RVPGX2
Influenza A by PCR: NEGATIVE
Influenza B by PCR: NEGATIVE
Resp Syncytial Virus by PCR: NEGATIVE
SARS Coronavirus 2 by RT PCR: NEGATIVE

## 2023-10-14 LAB — TROPONIN I (HIGH SENSITIVITY)
Troponin I (High Sensitivity): <2 ng/L (ref <18)
Troponin I (High Sensitivity): <2 ng/L (ref <18)

## 2023-10-14 LAB — HCG, SERUM, QUALITATIVE: Preg, Serum: NEGATIVE

## 2023-10-14 MED ORDER — PREDNISONE 50 MG PO TABS: 50.0000 mg | ORAL_TABLET | Freq: Every day | ORAL | 0 refills | Status: AC

## 2023-10-14 MED ORDER — IPRATROPIUM-ALBUTEROL 0.5-2.5 (3) MG/3ML IN SOLN
3.0000 mL | Freq: Once | RESPIRATORY_TRACT | Status: AC
  Administered 2023-10-14: 3 mL via RESPIRATORY_TRACT
  Filled 2023-10-14: qty 3

## 2023-10-14 MED ORDER — METHYLPREDNISOLONE SODIUM SUCC 125 MG IJ SOLR
125.0000 mg | Freq: Once | INTRAMUSCULAR | Status: AC
  Administered 2023-10-14: 125 mg via INTRAVENOUS
  Filled 2023-10-14: qty 2

## 2023-10-14 MED ORDER — MAGNESIUM SULFATE 2 GM/50ML IV SOLN
2.0000 g | Freq: Once | INTRAVENOUS | Status: AC
  Administered 2023-10-14: 2 g via INTRAVENOUS
  Filled 2023-10-14: qty 50

## 2023-10-14 NOTE — ED Triage Notes (Signed)
 Patient c/o chest pain and pressure w/ shob, palpitations, and wheezing after taking albuterol  and prednisone  inhaler and states the nurse from the PCP told her to come to the ER. Patient is alert and oriented, coughing but able to speak in complete sentences.

## 2023-10-14 NOTE — ED Provider Notes (Signed)
 [place] EMERGENCY DEPARTMENT AT [facility] Provider Note   CSN: [id number] Arrival date & time: 10/14/23  1357     Patient presents with: Chest Pain and Shortness of Breath   Vicki Mack is a 48 y.o. female.  With a history of asthma who presents to the ED for shortness of breath.  1 week of increasing shortness of breath and chest tightness.  She has been taking albuterol  and 10 mg prednisone  daily at home to no avail.  She called her PCP office and was instructed to come here for further evaluation.  Diagnosis of asthma has been relatively recent after being seen by pulmonology couple months ago.  No nausea vomiting diaphoresis fevers chills or productive coughing.  No prior history of hospitalization or intubation for asthma exacerbations.    Chest Pain Associated symptoms: shortness of breath   Shortness of Breath Associated symptoms: chest pain        Prior to Admission medications   Medication Sig Start Date End Date Taking? Authorizing Provider  predniSONE  (DELTASONE ) 50 MG tablet Take 1 tablet (50 mg total) by mouth daily with breakfast for 3 days. 10/14/23 10/17/23 Yes Pamella Ozell LABOR, DO  acetaminophen  (TYLENOL ) 325 MG tablet Take 2 tablets (650 mg total) by mouth every 6 (six) hours as needed for moderate pain. 06/15/21   Christopher Savannah, PA-C  albuterol  (VENTOLIN  HFA) 108 (90 Base) MCG/ACT inhaler Inhale 2 puffs into the lungs every 4 (four) hours as needed for wheezing or shortness of breath. 05/15/22   Vonna Sharlet POUR, MD  famotidine  (PEPCID ) 20 MG tablet Take 1 tablet (20 mg total) by mouth 2 (two) times daily. 07/25/23   Johnie Flaming A, NP  Ibuprofen -diphenhydrAMINE  Cit (ADVIL  PM PO) Take by mouth.    [provider]  Levonorgestrel  (LILETTA , 52 MG, IU) 1 application by Intrauterine route once. Since June 2017    [provider]  metoprolol  succinate (TOPROL  XL) 50 MG 24 hr tablet Take 1 tablet (50 mg total) by mouth daily. Take  with or immediately following a meal. 12/09/21   Van Knee, MD  naproxen  (NAPROSYN ) 500 MG tablet Take 1 tablet (500 mg total) by mouth 2 (two) times daily. 07/25/23   Johnie Flaming A, NP    Allergies: Aspirin , Codeine, Sulfa antibiotics, Penicillins, Dilaudid [hydromorphone hcl], and Phenergan [promethazine hcl]    Review of Systems  Respiratory:  Positive for shortness of breath.   Cardiovascular:  Positive for chest pain.    Updated Vital Signs BP (!) 124/90   Pulse 81   Temp 98.7 F (37.1 C)   Resp 17   Ht 5' 6 (1.676 m)   Wt 66.7 kg   SpO2 100%   BMI 23.73 kg/m   Physical Exam Vitals and nursing note reviewed.  HENT:     Head: Normocephalic and atraumatic.  Eyes:     Pupils: Pupils are equal, round, and reactive to light.  Cardiovascular:     Rate and Rhythm: Normal rate and regular rhythm.  Pulmonary:     Effort: Pulmonary effort is normal.     Breath sounds: Examination of the right-upper field reveals wheezing. Examination of the left-upper field reveals wheezing. Examination of the right-middle field reveals wheezing. Examination of the left-middle field reveals wheezing. Wheezing present.  Abdominal:     Palpations: Abdomen is soft.     Tenderness: There is no abdominal tenderness.  Musculoskeletal:     Right lower leg: No edema.  Left lower leg: No edema.  Skin:    General: Skin is warm and dry.  Neurological:     Mental Status: She is alert.  Psychiatric:        Mood and Affect: Mood normal.     (all labs ordered are listed, but only abnormal results are displayed) Labs Reviewed  BASIC METABOLIC PANEL WITH GFR - Abnormal; Notable for the following components:      Result Value   CO2 20 (*)    Glucose, Bld 123 (*)    BUN <5 (*)    All other components within normal limits  RESP PANEL BY RT-PCR (RSV, FLU A&B, COVID)  RVPGX2  CBC  HCG, SERUM, QUALITATIVE  TROPONIN I (HIGH SENSITIVITY)  TROPONIN I (HIGH SENSITIVITY)    EKG: EKG  Interpretation Date/Time:  Thursday October 14 2023 14:22:15 EDT Ventricular Rate:  81 PR Interval:  142 QRS Duration:  86 QT Interval:  376 QTC Calculation: 436 R Axis:   33  Text Interpretation: Normal sinus rhythm Possible Anterior infarct , age undetermined Abnormal ECG When compared with ECG of 17-Dec-2022 18:32, PREVIOUS ECG IS PRESENT Confirmed by Pamella Sharper [id number]) on 10/14/2023 3:04:30 PM  Radiology: [facility] Chest 2 View Result Date: 10/14/2023 CLINICAL DATA:  Chest pain and pressure. Shortness of breath, palpitations, and wheezing. EXAM: CHEST - 2 VIEW COMPARISON:  05/15/2022 FINDINGS: The heart size and mediastinal contours are within normal limits. Both lungs are clear. The visualized skeletal structures are unremarkable. Surgical clips in the right upper quadrant. IMPRESSION: No active cardiopulmonary disease. Electronically Signed   By: Elsie Gravely M.D.   On: 10/14/2023 15:23     Procedures   Medications Ordered in the ED  ipratropium-albuterol  (DUONEB) 0.5-2.5 (3) MG/3ML nebulizer solution 3 mL (3 mLs Nebulization Given 10/14/23 1544)  methylPREDNISolone  sodium succinate (SOLU-MEDROL ) 125 mg/2 mL injection 125 mg (125 mg Intravenous Given 10/14/23 1539)  magnesium  sulfate IVPB 2 g 50 mL (0 g Intravenous Stopped 10/14/23 1646)    Clinical Course as of 10/14/23 1802  Thu Oct 14, 2023  1800 Reevaluated patient after DuoNeb steroids and magnesium .  She sounds a lot better.  Minimal wheezing now.  Laboratory workup unremarkable.  Pregnancy negative.  Will discharge with a few more days of prednisone  for moderate asthma exacerbation.  Return precautions we worsen for acute respiratory distress were discussed in detail. [MP]    Clinical Course User Index [MP] Pamella Sharper LABOR, DO                                 Medical Decision Making 49 year old female with history as above presented to the ED for shortness of breath.  Resting comfortably on room air without acute distress  although with very audible wheezing.  Afebrile normotensive.  Exam notable for wheezing in all lung fields.  No pulmonary edema.  Speaking full sentences.  Presentation most consistent with asthma exacerbation due to viral respiratory illness or pneumonia.  Lower suspicion for pneumonia given her lack of fevers.  Given reported chest tightness will obtain EKG high-sensitivity troponin and basic labs as well.  Will provide DuoNeb treatment, IV Solu-Medrol  and IV magnesium  to see if it helps with her symptoms.  She has been taking prednisone  but only 10 mg daily  Amount and/or Complexity of Data Reviewed Labs: ordered. Radiology: ordered.  Risk Prescription drug management.        Final diagnoses:  Exacerbation  of asthma, unspecified asthma severity, unspecified whether persistent    ED Discharge Orders          Ordered    predniSONE  (DELTASONE ) 50 MG tablet  Daily with breakfast        10/14/23 1801               Pamella Ozell LABOR, DO 10/14/23 1802

## 2023-10-14 NOTE — ED Provider Triage Note (Signed)
 Emergency Medicine Provider Triage Evaluation Note  CHRISTALYN GOERTZ , a 48 y.o. female  was evaluated in triage.  Pt complains of wheezing, chest pain.  Patient recently diagnosed with asthma and feels like she is having what is consistent with her asthma exacerbations.  Patient called primary care and told to come to the emergency department.  Patient talking in full sentences on room air during history however audible wheezing present.  Review of Systems  Positive: Wheezing, chest pain Negative: Fever, chills, abdominal pain, nausea, vomiting  Physical Exam  BP (!) 135/102 (BP Location: Right Arm)   Pulse 85   Temp 98.7 F (37.1 C)   Resp 16   Ht 5' 6 (1.676 m)   Wt 66.7 kg   SpO2 98%   BMI 23.73 kg/m  Gen:   Awake, no distress   Resp:  Audible wheezing on lung exam MSK:   Moves extremities without difficulty  Other:  Patient talking in full sentences on room air, not in acute respiratory distress  Medical Decision Making  Medically screening exam initiated at 2:43 PM.  Appropriate orders placed.  Joelynn E Houston-Slade was informed that the remainder of the evaluation will be completed by another provider, this initial triage assessment does not replace that evaluation, and the importance of remaining in the ED until their evaluation is complete.  Orders: CBC, BMP, troponin, pregnancy, chest x-ray, EKG  Patient had albuterol  inhaler present in purse, I instructed the patient to use her albuterol  inhaler, instructed nursing to take patient back to next available room   Janetta Terrall FALCON, PA-C 10/14/23 1445

## 2023-10-14 NOTE — Discharge Instructions (Addendum)
 You are seen in the emerged department for an asthma exacerbation You felt better after steroids and DuoNeb treatment and magnesium  here We have called in a few more days of steroids. In your pharmacy This is a higher dose of the present you were taking.  Take 50 mg daily for the next 3 days.  You do not need to take the other prednisone  that you were previously on in addition to this Follow-up with your primary care doctor Return to the emergency room for trouble breathing chest pain or any other concerns

## 2023-10-26 ENCOUNTER — Emergency Department (HOSPITAL_COMMUNITY)
Admission: EM | Admit: 2023-10-26 | Discharge: 2023-10-26 | Disposition: A | Discharge status: Home / Self Care | Attending: Emergency Medicine | Admitting: Emergency Medicine

## 2023-10-26 ENCOUNTER — Encounter (HOSPITAL_COMMUNITY): Payer: Self-pay | Admitting: *Deleted

## 2023-10-26 ENCOUNTER — Emergency Department (HOSPITAL_COMMUNITY)

## 2023-10-26 ENCOUNTER — Other Ambulatory Visit: Payer: Self-pay

## 2023-10-26 DIAGNOSIS — I10 Essential (primary) hypertension: Secondary | ICD-10-CM | POA: Diagnosis not present

## 2023-10-26 DIAGNOSIS — U071 COVID-19: Secondary | ICD-10-CM | POA: Diagnosis not present

## 2023-10-26 DIAGNOSIS — R059 Cough, unspecified: Secondary | ICD-10-CM | POA: Diagnosis present

## 2023-10-26 LAB — RESP PANEL BY RT-PCR (RSV, FLU A&B, COVID)  RVPGX2
Influenza A by PCR: NEGATIVE
Influenza B by PCR: NEGATIVE
Resp Syncytial Virus by PCR: NEGATIVE
SARS Coronavirus 2 by RT PCR: POSITIVE — AB

## 2023-10-26 MED ORDER — BENZONATATE 100 MG PO CAPS: 100.0000 mg | ORAL_CAPSULE | Freq: Three times a day (TID) | ORAL | 0 refills | Status: DC | PRN

## 2023-10-26 NOTE — Discharge Instructions (Addendum)
 Evaluated you for your cough.  Your COVID test is positive.  Your chest x-ray does not show any evidence of a pneumonia or other dangerous infection.  Your symptoms should improve on their own with time and rest.  We have prescribed you some cough medication called Tessalon  that you can take as needed for cough.  Please be sure to drink lots of fluids.  Please take Tylenol  (acetaminophen ) and Motrin  (ibuprofen ) for your symptoms at home.  You can take 1000 mg of Tylenol  every 6 hours and 600 mg of Motrin  every 6 hours as needed for your symptoms.  You can take these medicines together as needed, either at the same time, or alternating every 3 hours.  Please follow-up closely with your primary doctor.  Please return if you develop any new or worsening symptoms such as difficulty breathing, persistent symptoms, or any other new symptoms.

## 2023-10-26 NOTE — ED Notes (Signed)
 Patient discharged by RN. Ambulatory to lobby at time of discharge without additional questions.

## 2023-10-26 NOTE — ED Provider Triage Note (Signed)
 Emergency Medicine Provider Triage Evaluation Note  Vicki Mack , a 48 y.o. female  was evaluated in triage.  Pt complains of cough that is been ongoing for the past 2 weeks after an asthma attack.  Has tried inhaler, over-the-counter medication without any improvement in symptoms.  Also endorsing substernal chest pain with any coughing episode.  Review of Systems  Positive: Cough, shortness of breath, chest pain Negative: Leg swelling, fever  Physical Exam  BP (!) 124/99 (BP Location: Right Arm)   Pulse 80   Temp 97.7 F (36.5 C)   Resp 17   SpO2 99%  Gen:   Awake, no distress   Resp:  Normal effort  MSK:   Moves extremities without difficulty  Other:  Mild wheezing noted.  Medical Decision Making  Medically screening exam initiated at 12:37 PM.  Appropriate orders placed.  Vicki Mack was informed that the remainder of the evaluation will be completed by another provider, this initial triage assessment does not replace that evaluation, and the importance of remaining in the ED until their evaluation is complete.     Edoardo Laforte, PA-C 10/26/23 1239

## 2023-10-26 NOTE — ED Provider Notes (Signed)
 [place] EMERGENCY DEPARTMENT AT [facility] Provider Note  CSN: [id number] Arrival date & time: 10/26/23 1057  Chief Complaint(s) Cough  HPI Vicki Mack is a 48 y.o. female history of bipolar disorder, hypertension presenting to the emergency department with cough.  Patient reports cough , nonproductive for around 4 to 5 days.  She reports that she has had some bodyaches and chills, subjective fevers.  Also reports runny nose.  Reports occasionally pain chest and abdomen with coughing but otherwise no pain.  No pleuritic pain.    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 Authorizing Provider  benzonatate  (TESSALON ) 100 MG capsule Take 1 capsule (100 mg total) by mouth 3 (three) times daily as needed for cough. 10/26/23  Yes Francesca Elsie CROME, MD  acetaminophen  (TYLENOL ) 325 MG tablet Take 2 tablets (650 mg total) by mouth every 6 (six) hours as needed for moderate pain. 06/15/21   Christopher Savannah, PA-C  albuterol  (VENTOLIN  HFA) 108 (90 Base) MCG/ACT inhaler Inhale 2 puffs into the lungs every 4 (four) hours as needed for wheezing or shortness of breath. 05/15/22   Vonna Sharlet POUR, MD  famotidine  (PEPCID ) 20 MG tablet Take 1 tablet (20 mg total) by mouth 2 (two) times daily. 07/25/23   Johnie Flaming A, NP  Ibuprofen -diphenhydrAMINE  Cit (ADVIL  PM PO) Take by mouth.    [provider]   Levonorgestrel  (LILETTA , 52 MG, IU) 1 application by Intrauterine route once. Since June 2017    [provider]  metoprolol  succinate (TOPROL  XL) 50 MG 24 hr tablet Take 1 tablet (50 mg total) by mouth daily. Take with or immediately following a meal. 12/09/21   Van Knee, MD  naproxen  (NAPROSYN ) 500 MG tablet Take 1 tablet (500 mg total) by mouth 2 (two) times daily. 07/25/23   Johnie Flaming LABOR, NP                                                                                                                                    Past Surgical History Past Surgical History:  Procedure Laterality Date   CHOLECYSTECTOMY  2021   NO PAST SURGERIES     Family History Family History  Problem Relation Age of Onset   Diabetes Mother  Hypertension Mother    Diabetes Father    Hypertension Father    Alzheimer's disease Father    Other Sister        breast cysts   Breast cancer Paternal Aunt        had mastectomy   Alzheimer's disease Paternal Aunt     Social History Social History   Tobacco Use   Smoking status: Never   Smokeless tobacco: Never  Vaping Use   Vaping status: Never Used  Substance Use Topics   Alcohol use: No   Drug use: No   Allergies Aspirin , Codeine, Sulfa antibiotics, Penicillins, Dilaudid [hydromorphone hcl], and Phenergan [promethazine hcl]  Review of Systems Review of Systems  All other systems reviewed and are negative.   Physical Exam Vital Signs  I have reviewed the triage vital signs BP 129/81 (BP Location: Right Arm)   Pulse 81   Temp 98.2 F (36.8 C) (Oral)   Resp 17   SpO2 100%  Physical Exam Vitals and nursing note reviewed.  Constitutional:      General: She is not in acute distress.    Appearance: She is well-developed.  HENT:     Head: Normocephalic and atraumatic.     Mouth/Throat:     Mouth: Mucous membranes are moist.  Eyes:     Pupils: Pupils are equal, round, and reactive to light.  Cardiovascular:      Rate and Rhythm: Normal rate and regular rhythm.     Heart sounds: No murmur heard. Pulmonary:     Effort: Pulmonary effort is normal. No respiratory distress.     Breath sounds: Normal breath sounds.  Abdominal:     General: Abdomen is flat.     Palpations: Abdomen is soft.     Tenderness: There is no abdominal tenderness.  Musculoskeletal:        General: No tenderness.     Right lower leg: No edema.     Left lower leg: No edema.  Skin:    General: Skin is warm and dry.  Neurological:     General: No focal deficit present.     Mental Status: She is alert. Mental status is at baseline.  Psychiatric:        Mood and Affect: Mood normal.        Behavior: Behavior normal.     ED Results and Treatments Labs (all labs ordered are listed, but only abnormal results are displayed) Labs Reviewed  RESP PANEL BY RT-PCR (RSV, FLU A&B, COVID)  RVPGX2 - Abnormal; Notable for the following components:      Result Value   SARS Coronavirus 2 by RT PCR POSITIVE (*)    All other components within normal limits                                                                                                                          Radiology DG Chest 2 View Result Date: 10/26/2023 EXAM: 2 VIEW(S) XRAY OF  THE CHEST 10/26/2023 12:51:15 PM COMPARISON: 10/14/2023 CLINICAL HISTORY: Cough. Per chart - Pt is here for further evaluation of cough and cold since Saturday. Pt states that the cough has been non productive but she has had body aches and is concerned about pneumonia. FINDINGS: LUNGS AND PLEURA: No focal pulmonary opacity. No pulmonary edema. No pleural effusion. No pneumothorax. HEART AND MEDIASTINUM: No acute abnormality of the cardiac and mediastinal silhouettes. BONES AND SOFT TISSUES: Intact visualized skeletal structures with mild thoracic dextroscoliosis. No acute osseous abnormality. IMPRESSION: 1. No acute process. Electronically signed by: Waddell Calk MD 10/26/2023 01:10 PM EDT RP  Workstation: [id number]    Pertinent labs & imaging results that were available during my care of the patient were reviewed by me and considered in my medical decision making (see MDM for details).  Medications Ordered in ED Medications - No data to display                                                                                                                                   Procedures Procedures  (including critical care time)  Medical Decision Making / ED Course   MDM:  48 year old female presenting to the emergency department with cough.  Patient overall well-appearing, physical examination without focal abnormality.  Lungs are clear on exam.  Not hypoxic or febrile in the ER.  Suspect patient's symptoms are due to COVID infection.  Her COVID test is positive.  Her chest x-ray does not show evidence of any underlying pneumonia or other acute process.  She has not been taking anything for symptoms at home.  She request Tessalon  prescription as she reports this has helped her previously.  Will prescribe this for patient.  Given her reassuring examination and positive COVID test I do not think she needs any further emergency department workup at this time and I feel that she is stable for discharge.     Lab Tests: -I ordered, reviewed, and interpreted labs.   The pertinent results include:   Labs Reviewed  RESP PANEL BY RT-PCR (RSV, FLU A&B, COVID)  RVPGX2 - Abnormal; Notable for the following components:      Result Value   SARS Coronavirus 2 by RT PCR POSITIVE (*)    All other components within normal limits    Notable for +covid    Imaging Studies ordered: I ordered imaging studies including CXR On my interpretation imaging demonstrates no acute infiltrate  I independently visualized and interpreted imaging. I agree with the radiologist interpretation   Medicines ordered and prescription drug management: Meds ordered this encounter  Medications    benzonatate  (TESSALON ) 100 MG capsule    Sig: Take 1 capsule (100 mg total) by mouth 3 (three) times daily as needed for cough.    Dispense:  21 capsule    Refill:  0    -I have reviewed the patients home medicines and have made adjustments as needed  Co morbidities that complicate the patient evaluation  Past Medical History:  Diagnosis Date   Abnormal Pap smear    colpo with biopsy at age 35   Aneurysm (HCC)    Bipolar 1 disorder (HCC)    Hypertension    Infertility    able to become pregnant with Clomid   Low iron    PTSD (post-traumatic stress disorder)    seeing neuropsychologist      Dispostion: Disposition decision including need for hospitalization was considered, and patient discharged from emergency department.    Final Clinical Impression(s) / ED Diagnoses Final diagnoses:  COVID-19     This chart was dictated using voice recognition software.  Despite best efforts to proofread,  errors can occur which can change the documentation meaning.    Francesca Elsie CROME, MD 10/26/23 [phone]

## 2023-10-26 NOTE — ED Triage Notes (Signed)
 Pt is here for further evaluation of cough and cold since Saturday.  Pt states that the cough has been non productive but she has had body aches and is concerned about pneumonia.

## 2024-02-19 ENCOUNTER — Emergency Department (HOSPITAL_COMMUNITY)

## 2024-02-19 ENCOUNTER — Emergency Department (HOSPITAL_COMMUNITY): Admission: EM | Admit: 2024-02-19 | Discharge: 2024-02-19 | Disposition: A | Discharge status: Home / Self Care

## 2024-02-19 ENCOUNTER — Other Ambulatory Visit: Payer: Self-pay

## 2024-02-19 ENCOUNTER — Encounter (HOSPITAL_COMMUNITY): Payer: Self-pay | Admitting: *Deleted

## 2024-02-19 DIAGNOSIS — R059 Cough, unspecified: Secondary | ICD-10-CM | POA: Diagnosis not present

## 2024-02-19 DIAGNOSIS — J45909 Unspecified asthma, uncomplicated: Secondary | ICD-10-CM | POA: Diagnosis not present

## 2024-02-19 DIAGNOSIS — R079 Chest pain, unspecified: Secondary | ICD-10-CM | POA: Insufficient documentation

## 2024-02-19 DIAGNOSIS — R0602 Shortness of breath: Secondary | ICD-10-CM | POA: Insufficient documentation

## 2024-02-19 HISTORY — DX: Unspecified asthma, uncomplicated: J45.909

## 2024-02-19 LAB — COMPREHENSIVE METABOLIC PANEL WITH GFR
ALT: 7 U/L (ref 0–44)
AST: 20 U/L (ref 15–41)
Albumin: 4.2 g/dL (ref 3.5–5.0)
Alkaline Phosphatase: 69 U/L (ref 38–126)
Anion gap: 11 (ref 5–15)
BUN: 7 mg/dL (ref 6–20)
CO2: 22 mmol/L (ref 22–32)
Calcium: 9.3 mg/dL (ref 8.9–10.3)
Chloride: 104 mmol/L (ref 98–111)
Creatinine, Ser: 0.77 mg/dL (ref 0.44–1.00)
GFR, Estimated: >60 mL/min
Glucose, Bld: 79 mg/dL (ref 70–99)
Potassium: 3.7 mmol/L (ref 3.5–5.1)
Sodium: 137 mmol/L (ref 135–145)
Total Bilirubin: 0.3 mg/dL (ref 0.0–1.2)
Total Protein: 7 g/dL (ref 6.5–8.1)

## 2024-02-19 LAB — CBC WITH DIFFERENTIAL/PLATELET
Abs Immature Granulocytes: 0.04 K/uL (ref 0.00–0.07)
Basophils Absolute: 0 K/uL (ref 0.0–0.1)
Basophils Relative: 0 %
Eosinophils Absolute: 0 K/uL (ref 0.0–0.5)
Eosinophils Relative: 0 %
HCT: 44.5 % (ref 36.0–46.0)
Hemoglobin: 14.7 g/dL (ref 12.0–15.0)
Immature Granulocytes: 1 %
Lymphocytes Relative: 33 %
Lymphs Abs: 2.7 K/uL (ref 0.7–4.0)
MCH: 28.9 pg (ref 26.0–34.0)
MCHC: 33 g/dL (ref 30.0–36.0)
MCV: 87.6 fL (ref 80.0–100.0)
Monocytes Absolute: 0.7 K/uL (ref 0.1–1.0)
Monocytes Relative: 8 %
Neutro Abs: 4.7 K/uL (ref 1.7–7.7)
Neutrophils Relative %: 58 %
Platelets: 261 K/uL (ref 150–400)
RBC: 5.08 MIL/uL (ref 3.87–5.11)
RDW: 13.9 % (ref 11.5–15.5)
WBC: 8.2 K/uL (ref 4.0–10.5)
nRBC: 0 % (ref 0.0–0.2)

## 2024-02-19 LAB — RESP PANEL BY RT-PCR (RSV, FLU A&B, COVID)  RVPGX2
Influenza A by PCR: NEGATIVE
Influenza B by PCR: NEGATIVE
Resp Syncytial Virus by PCR: NEGATIVE
SARS Coronavirus 2 by RT PCR: NEGATIVE

## 2024-02-19 LAB — TROPONIN T, HIGH SENSITIVITY
Troponin T High Sensitivity: <15 ng/L (ref 0–19)
Troponin T High Sensitivity: <15 ng/L (ref 0–19)

## 2024-02-19 MED ORDER — METHYLPREDNISOLONE SODIUM SUCC 125 MG IJ SOLR
125.0000 mg | Freq: Once | INTRAMUSCULAR | Status: AC
  Administered 2024-02-19: 125 mg via INTRAVENOUS
  Filled 2024-02-19: qty 2

## 2024-02-19 MED ORDER — BENZONATATE 100 MG PO CAPS: 100.0000 mg | ORAL_CAPSULE | Freq: Three times a day (TID) | ORAL | 0 refills | Status: AC

## 2024-02-19 MED ORDER — IPRATROPIUM-ALBUTEROL 0.5-2.5 (3) MG/3ML IN SOLN
3.0000 mL | Freq: Once | RESPIRATORY_TRACT | Status: AC
  Administered 2024-02-19: 3 mL via RESPIRATORY_TRACT
  Filled 2024-02-19: qty 3

## 2024-02-19 NOTE — ED Triage Notes (Signed)
 Patient c/o sob and chest pain onset this am , states she has a history of asthma and has a chest CT last month and was told she had a tumor in her chest

## 2024-02-19 NOTE — Discharge Instructions (Signed)
 Your symptoms today are likely due to an exacerbation of your asthma, continue use of your albuterol  rescue inhaler every 4 hours as needed for wheezing/shortness of breath, continue use of your nebulizer solution as directed by your PCP/asthma specialist.  Keep your scheduled appointment with your PCP/asthma specialist for January 12, keep your scheduled appointment with your cardiologist to discuss your episode of chest pain today.  I prescribed you with Tessalon , take 1 capsule by mouth every 8 hours as needed for cough.  Return to the emergency department if your symptoms worsen.

## 2024-02-19 NOTE — ED Provider Notes (Signed)
 " [place] EMERGENCY DEPARTMENT AT [facility] Provider Note   CSN: [id number] Arrival date & time: 02/19/24  1438     Patient presents with: Chest Pain and Shortness of Breath   Vicki Mack is a 49 y.o. female.   49 year old female presenting with chest pain, shortness of breath/wheezing.  Patient with a history of asthma, reports that her symptoms are not well-controlled and she has daily episodes of coughing/wheezing with associated shortness of breath despite use of her albuterol  inhaler/nebulizer solution.  Patient is scheduled to follow-up with her asthma specialist/PCP on January 12 to discuss her ongoing symptoms.  Endorses sharp pleuritic chest pain today that lasted for several hours, this has since resolved.  Denies abdominal pain/nausea/vomiting.  Reports symptomatic improvement after administration of Solu-Medrol  and DuoNeb in triage.   Chest Pain Associated symptoms: shortness of breath   Shortness of Breath Associated symptoms: chest pain        Prior to Admission medications  Medication Sig Start Date End Date Taking? Authorizing Provider  acetaminophen  (TYLENOL ) 325 MG tablet Take 2 tablets (650 mg total) by mouth every 6 (six) hours as needed for moderate pain. 06/15/21   Christopher Savannah, PA-C  albuterol  (VENTOLIN  HFA) [phone] Base) MCG/ACT inhaler Inhale 2 puffs into the lungs every 4 (four) hours as needed for wheezing or shortness of breath. 05/15/22   Vonna Sharlet POUR, MD  benzonatate  (TESSALON ) 100 MG capsule Take 1 capsule (100 mg total) by mouth 3 (three) times daily as needed for cough. 10/26/23   Francesca Elsie CROME, MD  famotidine  (PEPCID ) 20 MG tablet Take 1 tablet (20 mg total) by mouth 2 (two) times daily. 07/25/23   Johnie Flaming A, NP  Ibuprofen -diphenhydrAMINE  Cit (ADVIL  PM PO) Take by mouth.    [provider]  Levonorgestrel  (LILETTA , 52 MG, IU) 1 application by Intrauterine route once. Since June 2017    [provider]  metoprolol  succinate (TOPROL  XL) 50 MG 24 hr tablet Take 1 tablet (50 mg total) by mouth daily. Take with or immediately following a meal. 12/09/21   Van Knee, MD  naproxen  (NAPROSYN ) 500 MG tablet Take 1 tablet (500 mg total) by mouth 2 (two) times daily. 07/25/23   Johnie Flaming A, NP    Allergies: Aspirin , Codeine, Sulfa antibiotics, Penicillins, Dilaudid [hydromorphone hcl], and Phenergan [promethazine hcl]    Review of Systems  Respiratory:  Positive for shortness of breath.   Cardiovascular:  Positive for chest pain.    Updated Vital Signs  Vitals:   02/19/24 1444 02/19/24 1517 02/19/24 1912  BP: (!) 138/110  (!) 146/95  Pulse: 99  87  Resp: (!) 22  17  Temp: 97.6 F (36.4 C)  98.8 F (37.1 C)  SpO2: 98%  98%  Weight:  66.7 kg   Height:  5' 6 (1.676 m)      Physical Exam Vitals and nursing note reviewed.  HENT:     Head: Normocephalic and atraumatic.  Eyes:     Extraocular Movements: Extraocular movements intact.     Pupils: Pupils are equal, round, and reactive to light.  Cardiovascular:     Rate and Rhythm: Normal rate and regular rhythm.     Heart sounds: Normal heart sounds.  Pulmonary:     Effort: Pulmonary effort is normal. No respiratory distress.     Breath sounds: Normal breath sounds. No wheezing.  Musculoskeletal:     Cervical back: Normal range of motion.  Right lower leg: No edema.     Left lower leg: No edema.     Comments: Moves all extremities spontaneously without difficulty  Skin:    General: Skin is warm and dry.  Neurological:     General: No focal deficit present.     Mental Status: She is alert and oriented to person, place, and time.     (all labs ordered are listed, but only abnormal results are displayed) Labs Reviewed  RESP PANEL BY RT-PCR (RSV, FLU A&B, COVID)  [id number]  CBC WITH DIFFERENTIAL/PLATELET  COMPREHENSIVE METABOLIC PANEL WITH GFR  TROPONIN T, HIGH SENSITIVITY  TROPONIN T, HIGH  SENSITIVITY    EKG: None  Radiology: DG Chest 2 View Result Date: 02/19/2024 CLINICAL DATA:  Shortness of breath and chest pain. EXAM: CHEST - 2 VIEW COMPARISON:  10/26/2023, 09/30/2018. FINDINGS: The heart size and mediastinal contours are within normal limits. No consolidation, effusion, or pneumothorax. No acute osseous abnormality. Surgical clips are present in the right upper quadrant. IMPRESSION: No active cardiopulmonary disease. Electronically Signed   By: Leita Birmingham M.D.   On: 02/19/2024 17:09     Procedures   Medications Ordered in the ED  ipratropium-albuterol  (DUONEB) 0.5-2.5 (3) MG/3ML nebulizer solution 3 mL (3 mLs Nebulization Given 02/19/24 1529)  methylPREDNISolone  sodium succinate (SOLU-MEDROL ) 125 mg/2 mL injection 125 mg (125 mg Intravenous Given 02/19/24 1545)                                    Medical Decision Making This patient presents to the ED for concern of cough/shortness of breath/chest pain, this involves an extensive number of treatment options, and is a complaint that carries with it a high risk of complications and morbidity.  The differential diagnosis includes asthma exacerbation, pneumonia, COVID/flu/RSV, ACS, PE   Co morbidities that complicate the patient evaluation  Asthma    Additional history obtained:  Additional history obtained from record review External records from outside source obtained and reviewed including prior PCP note   Lab Tests:  I Ordered, and personally interpreted labs.  The pertinent results include: CBC within normal limits.  CMP within normal limits.  Respiratory panel negative.  Troponin < 15 x 2.     Imaging Studies ordered:  I ordered imaging studies including CXR  I independently visualized and interpreted imaging which showed No active cardiopulmonary disease.  I agree with the radiologist interpretation   Cardiac Monitoring: / EKG:  The patient was maintained on a cardiac monitor.  I personally  viewed and interpreted the cardiac monitored which showed an underlying rhythm of: NSR   Problem List / ED Course / Critical interventions / Medication management  I ordered medication including Solu-Medrol  and DuoNeb ordered in triage for suspected asthma exacerbation/shortness of breath   Reevaluation of the patient after these medicines showed that the patient improved I have reviewed the patients home medicines and have made adjustments as needed   Test / Admission - Considered:  Physical exam is unremarkable as above, at time my assessment patient is not demonstrating any signs/symptoms of respiratory distress, her lungs are clear to auscultation bilaterally without adventitious sounds, no wheezing or tachypnea.  Patient received a DuoNeb/Solu-Medrol  in triage and notes symptomatic improvement.  History of asthma, reports symptoms today are similar to her typical asthma exacerbations.  Was recently on a course of prednisone  but did not feel that this alleviated her symptoms.  Has been using her inhaler/asthma medications as directed, was taken off of Singulair  recently but is scheduled to follow-up with her provider on January 12 to discuss her symptoms further/determine if her plan of treatment should be changed.  ACS workup is negative as above, patient does not have continued chest pain.  Patient notes bothersome nightly cough, which I suspect is related to her asthma.  Will prescribe Tessalon  to be used as needed for cough, recommend continued use of inhalers/asthma medications as above.  She is also scheduled to follow-up with a cardiologist soon for outpatient testing due to prior episodes of chest pain, I recommend that she keep this appointment as scheduled.  Patient voiced understanding is in agreement with this plan, return precautions discussed, she is appropriate for discharge at this time.  Risk Prescription drug management.        Final diagnoses:  Chest pain, unspecified  type  Shortness of breath  Cough, unspecified type    ED Discharge Orders          Ordered    benzonatate  (TESSALON ) 100 MG capsule  Every 8 hours        02/19/24 2203               Glendia Rocky SAILOR, PA-C 02/19/24 2213    Ula Prentice SAUNDERS, MD 02/19/24 2256  "

## 2024-02-19 NOTE — ED Provider Triage Note (Signed)
 Emergency Medicine Provider Triage Evaluation Note  Vicki Mack , a 49 y.o. female  was evaluated in triage.  Pt complains of chest pain and shortness of breath that started this morning.  Reports she has a history of asthma and tried her inhalers at home without relief of symptoms.  Chest pain is central.  Nonexertional, does not radiate to the back.  She denies any fever or chills at home.  No cough.  She reports getting a chest CT last month which showed a tumor in her chest.  However, on review of her CT, this was a lymph node and not a mass/tumor.  Review of Systems  Positive: As above Negative: As above  Physical Exam  BP (!) 138/110   Pulse 99   Temp 97.6 F (36.4 C)   Resp (!) 22   Ht 5' 6 (1.676 m)   Wt 66.7 kg   SpO2 98%   BMI 23.73 kg/m  Gen:   Awake, no distress   Resp:  Normal effort  MSK:   Moves extremities without difficulty   Diffuse wheezing in the lungs bilaterally.  Able to talk in full sentences on room air without difficulty.  Medical Decision Making  Medically screening exam initiated at 3:21 PM.  Appropriate orders placed.  Vicki Mack was informed that the remainder of the evaluation will be completed by another provider, this initial triage assessment does not replace that evaluation, and the importance of remaining in the ED until their evaluation is complete.     Veta Palma, PA-C 02/19/24 1521
